# Patient Record
Sex: Female | Born: 1935 | Race: Black or African American | Hispanic: No | Marital: Single | Smoking: Never smoker
Health system: Southern US, Community
[De-identification: ages and names within clinical notes are randomized; demographics above are authoritative.]

## PROBLEM LIST (undated history)

## (undated) DIAGNOSIS — K6389 Other specified diseases of intestine: Secondary | ICD-10-CM

## (undated) DIAGNOSIS — I10 Essential (primary) hypertension: Secondary | ICD-10-CM

## (undated) DIAGNOSIS — E785 Hyperlipidemia, unspecified: Secondary | ICD-10-CM

## (undated) DIAGNOSIS — M1990 Unspecified osteoarthritis, unspecified site: Secondary | ICD-10-CM

## (undated) HISTORY — DX: Unspecified osteoarthritis, unspecified site: M19.90

## (undated) HISTORY — DX: Essential (primary) hypertension: I10

## (undated) HISTORY — PX: COLONOSCOPY: SHX174

## (undated) HISTORY — DX: Hyperlipidemia, unspecified: E78.5

## (undated) HISTORY — DX: Other specified diseases of intestine: K63.89

---

## 1996-02-18 LAB — HM MAMMOGRAPHY

## 1998-01-17 ENCOUNTER — Encounter: Payer: Self-pay | Admitting: Internal Medicine

## 1998-01-17 LAB — CONVERTED CEMR LAB

## 1998-03-17 ENCOUNTER — Encounter: Admission: RE | Admit: 1998-03-17 | Discharge: 1998-06-15 | Payer: Self-pay | Admitting: Family Medicine

## 1999-10-24 ENCOUNTER — Encounter: Payer: Self-pay | Admitting: Internal Medicine

## 1999-10-24 ENCOUNTER — Ambulatory Visit (HOSPITAL_COMMUNITY): Admission: RE | Admit: 1999-10-24 | Discharge: 1999-10-24 | Payer: Self-pay | Admitting: Internal Medicine

## 2000-03-27 ENCOUNTER — Ambulatory Visit (HOSPITAL_COMMUNITY): Admission: RE | Admit: 2000-03-27 | Discharge: 2000-03-27 | Payer: Self-pay | Admitting: Internal Medicine

## 2000-03-27 ENCOUNTER — Encounter: Payer: Self-pay | Admitting: Internal Medicine

## 2000-12-17 ENCOUNTER — Other Ambulatory Visit: Admission: RE | Admit: 2000-12-17 | Discharge: 2000-12-17 | Payer: Self-pay | Admitting: Internal Medicine

## 2005-01-12 ENCOUNTER — Ambulatory Visit: Payer: Self-pay | Admitting: Internal Medicine

## 2005-01-16 ENCOUNTER — Ambulatory Visit: Payer: Self-pay | Admitting: Internal Medicine

## 2006-08-12 ENCOUNTER — Ambulatory Visit: Payer: Self-pay | Admitting: Internal Medicine

## 2006-08-23 ENCOUNTER — Ambulatory Visit: Payer: Self-pay | Admitting: Internal Medicine

## 2006-09-16 ENCOUNTER — Ambulatory Visit: Payer: Self-pay | Admitting: Internal Medicine

## 2006-09-16 LAB — CONVERTED CEMR LAB
ALT: 15 units/L (ref 0–40)
AST: 20 units/L (ref 0–37)
Chol/HDL Ratio, serum: 3.9
Cholesterol: 155 mg/dL (ref 0–200)
HDL: 39.9 mg/dL (ref >39.0)
LDL Cholesterol: 102 mg/dL — ABNORMAL HIGH (ref 0–99)
Triglyceride fasting, serum: 65 mg/dL (ref 0–149)
VLDL: 13 mg/dL (ref 0–40)

## 2006-09-23 ENCOUNTER — Ambulatory Visit: Payer: Self-pay | Admitting: Internal Medicine

## 2006-09-24 ENCOUNTER — Ambulatory Visit: Payer: Self-pay | Admitting: Internal Medicine

## 2006-10-01 ENCOUNTER — Ambulatory Visit: Payer: Self-pay | Admitting: Internal Medicine

## 2006-10-21 ENCOUNTER — Ambulatory Visit: Payer: Self-pay | Admitting: Internal Medicine

## 2006-10-22 ENCOUNTER — Ambulatory Visit: Payer: Self-pay | Admitting: Internal Medicine

## 2006-11-01 ENCOUNTER — Ambulatory Visit: Payer: Self-pay | Admitting: Internal Medicine

## 2006-11-01 ENCOUNTER — Encounter (INDEPENDENT_AMBULATORY_CARE_PROVIDER_SITE_OTHER): Payer: Self-pay | Admitting: Specialist

## 2006-11-01 LAB — HM COLONOSCOPY

## 2006-11-19 DIAGNOSIS — K6389 Other specified diseases of intestine: Secondary | ICD-10-CM

## 2006-11-19 HISTORY — DX: Other specified diseases of intestine: K63.89

## 2006-11-19 HISTORY — PX: COLECTOMY: SHX59

## 2006-12-17 ENCOUNTER — Ambulatory Visit: Payer: Self-pay | Admitting: Internal Medicine

## 2006-12-17 LAB — CONVERTED CEMR LAB
Creatinine, Ser: 0.9 mg/dL (ref 0.4–1.2)
Eosinophils Absolute: 0.3 10*3/uL (ref 0.0–0.6)
Eosinophils Relative: 7.4 % — ABNORMAL HIGH (ref 0.0–5.0)
HCT: 37.6 % (ref 36.0–46.0)
MCV: 82.3 fL (ref 78.0–100.0)
Platelets: 336 10*3/uL (ref 150–400)
RBC: 4.57 M/uL (ref 3.87–5.11)
RDW: 11.7 % (ref 11.5–14.6)
Sed Rate: 24 mm/hr (ref 0–25)
WBC: 4.5 10*3/uL (ref 4.5–10.5)

## 2006-12-24 ENCOUNTER — Ambulatory Visit: Payer: Self-pay | Admitting: Cardiology

## 2007-01-03 ENCOUNTER — Ambulatory Visit (HOSPITAL_COMMUNITY): Admission: RE | Admit: 2007-01-03 | Discharge: 2007-01-03 | Payer: Self-pay | Admitting: Internal Medicine

## 2007-04-10 ENCOUNTER — Inpatient Hospital Stay (HOSPITAL_COMMUNITY): Admission: RE | Admit: 2007-04-10 | Discharge: 2007-04-13 | Payer: Self-pay | Admitting: General Surgery

## 2007-04-10 ENCOUNTER — Encounter (INDEPENDENT_AMBULATORY_CARE_PROVIDER_SITE_OTHER): Payer: Self-pay | Admitting: General Surgery

## 2007-05-16 ENCOUNTER — Ambulatory Visit: Payer: Self-pay | Admitting: Internal Medicine

## 2007-07-24 ENCOUNTER — Encounter: Payer: Self-pay | Admitting: Internal Medicine

## 2007-07-24 DIAGNOSIS — I10 Essential (primary) hypertension: Secondary | ICD-10-CM | POA: Insufficient documentation

## 2007-07-24 DIAGNOSIS — M810 Age-related osteoporosis without current pathological fracture: Secondary | ICD-10-CM

## 2007-07-24 DIAGNOSIS — E785 Hyperlipidemia, unspecified: Secondary | ICD-10-CM

## 2007-10-03 ENCOUNTER — Ambulatory Visit: Payer: Self-pay | Admitting: Internal Medicine

## 2008-02-04 ENCOUNTER — Ambulatory Visit: Payer: Self-pay | Admitting: Internal Medicine

## 2008-02-04 LAB — CONVERTED CEMR LAB
Basophils Relative: 1.1 % — ABNORMAL HIGH (ref 0.0–1.0)
Bilirubin, Direct: 0.2 mg/dL (ref 0.0–0.3)
CO2: 30 meq/L (ref 19–32)
Eosinophils Relative: 7 % — ABNORMAL HIGH (ref 0.0–5.0)
GFR calc Af Amer: 80 mL/min
Glucose, Bld: 93 mg/dL (ref 70–99)
HDL: 49.3 mg/dL (ref >39.0)
Hemoglobin: 12.5 g/dL (ref 12.0–15.0)
Lymphocytes Relative: 52.5 % — ABNORMAL HIGH (ref 12.0–46.0)
MCV: 83.1 fL (ref 78.0–100.0)
Monocytes Absolute: 0.6 10*3/uL (ref 0.2–0.7)
Monocytes Relative: 13.7 % — ABNORMAL HIGH (ref 3.0–11.0)
Neutro Abs: 1.1 10*3/uL — ABNORMAL LOW (ref 1.4–7.7)
Potassium: 3.6 meq/L (ref 3.5–5.1)
TSH: 0.73 microintl units/mL (ref 0.35–5.50)
Total Protein: 7.3 g/dL (ref 6.0–8.3)
VLDL: 14 mg/dL (ref 0–40)
WBC: 4.4 10*3/uL — ABNORMAL LOW (ref 4.5–10.5)

## 2008-02-11 ENCOUNTER — Ambulatory Visit: Payer: Self-pay | Admitting: Internal Medicine

## 2008-02-11 DIAGNOSIS — K6289 Other specified diseases of anus and rectum: Secondary | ICD-10-CM | POA: Insufficient documentation

## 2008-03-22 ENCOUNTER — Ambulatory Visit: Payer: Self-pay | Admitting: Internal Medicine

## 2008-03-22 ENCOUNTER — Encounter: Payer: Self-pay | Admitting: Internal Medicine

## 2008-04-04 ENCOUNTER — Encounter: Payer: Self-pay | Admitting: Internal Medicine

## 2008-08-02 ENCOUNTER — Telehealth: Payer: Self-pay | Admitting: Internal Medicine

## 2009-03-07 ENCOUNTER — Encounter: Payer: Self-pay | Admitting: Internal Medicine

## 2009-03-14 ENCOUNTER — Ambulatory Visit: Payer: Self-pay | Admitting: Internal Medicine

## 2009-03-14 LAB — CONVERTED CEMR LAB
Albumin: 3.9 g/dL (ref 3.5–5.2)
Basophils Relative: 0.1 % (ref 0.0–3.0)
Bilirubin, Direct: 0.2 mg/dL (ref 0.0–0.3)
Cholesterol: 171 mg/dL (ref 0–200)
Eosinophils Absolute: 0.4 10*3/uL (ref 0.0–0.7)
GFR calc non Af Amer: 90.91 mL/min (ref >60)
HDL: 53.5 mg/dL (ref >39.00)
Hemoglobin, Urine: NEGATIVE
MCHC: 33.3 g/dL (ref 30.0–36.0)
MCV: 83 fL (ref 78.0–100.0)
Monocytes Absolute: 0.6 10*3/uL (ref 0.1–1.0)
Neutrophils Relative %: 29.8 % — ABNORMAL LOW (ref 43.0–77.0)
Nitrite: NEGATIVE
Potassium: 4.1 meq/L (ref 3.5–5.1)
RBC: 4.86 M/uL (ref 3.87–5.11)
Sodium: 143 meq/L (ref 135–145)
Total Protein, Urine: NEGATIVE mg/dL
Total Protein: 7.7 g/dL (ref 6.0–8.3)
Urobilinogen, UA: 0.2 (ref 0.0–1.0)
VLDL: 12.2 mg/dL (ref 0.0–40.0)

## 2009-03-24 ENCOUNTER — Ambulatory Visit: Payer: Self-pay | Admitting: Internal Medicine

## 2009-03-24 DIAGNOSIS — E0590 Thyrotoxicosis, unspecified without thyrotoxic crisis or storm: Secondary | ICD-10-CM

## 2009-03-24 LAB — CONVERTED CEMR LAB: T3, Free: 3 pg/mL (ref 2.3–4.2)

## 2009-03-27 ENCOUNTER — Encounter: Payer: Self-pay | Admitting: Internal Medicine

## 2009-03-31 ENCOUNTER — Encounter: Admission: RE | Admit: 2009-03-31 | Discharge: 2009-03-31 | Payer: Self-pay | Admitting: Internal Medicine

## 2009-04-01 ENCOUNTER — Encounter: Payer: Self-pay | Admitting: Internal Medicine

## 2009-04-11 ENCOUNTER — Telehealth: Payer: Self-pay | Admitting: Internal Medicine

## 2009-10-18 ENCOUNTER — Telehealth: Payer: Self-pay | Admitting: Internal Medicine

## 2010-03-08 ENCOUNTER — Encounter: Payer: Self-pay | Admitting: Internal Medicine

## 2010-03-15 ENCOUNTER — Encounter: Payer: Self-pay | Admitting: Internal Medicine

## 2010-04-04 ENCOUNTER — Ambulatory Visit: Payer: Self-pay | Admitting: Internal Medicine

## 2010-04-04 LAB — CONVERTED CEMR LAB
AST: 19 units/L (ref 0–37)
Alkaline Phosphatase: 54 units/L (ref 39–117)
BUN: 10 mg/dL (ref 6–23)
Basophils Absolute: 0 10*3/uL (ref 0.0–0.1)
Calcium: 9.7 mg/dL (ref 8.4–10.5)
Cholesterol: 162 mg/dL (ref 0–200)
Eosinophils Absolute: 0.4 10*3/uL (ref 0.0–0.7)
GFR calc non Af Amer: 89.35 mL/min (ref >60)
Glucose, Bld: 75 mg/dL (ref 70–99)
HDL: 53.9 mg/dL (ref >39.00)
Ketones, ur: NEGATIVE mg/dL
Lymphocytes Relative: 46.2 % — ABNORMAL HIGH (ref 12.0–46.0)
Lymphs Abs: 2.1 10*3/uL (ref 0.7–4.0)
MCHC: 34 g/dL (ref 30.0–36.0)
Monocytes Relative: 12.7 % — ABNORMAL HIGH (ref 3.0–12.0)
Nitrite: NEGATIVE
Platelets: 287 10*3/uL (ref 150.0–400.0)
RDW: 12.8 % (ref 11.5–14.6)
Specific Gravity, Urine: 1.025 (ref 1.000–1.030)
TSH: 0.16 microintl units/mL — ABNORMAL LOW (ref 0.35–5.50)
Total Bilirubin: 0.8 mg/dL (ref 0.3–1.2)
Triglycerides: 44 mg/dL (ref 0.0–149.0)
Urobilinogen, UA: 0.2 (ref 0.0–1.0)
VLDL: 8.8 mg/dL (ref 0.0–40.0)
pH: 6 (ref 5.0–8.0)

## 2010-04-14 ENCOUNTER — Ambulatory Visit: Payer: Self-pay | Admitting: Internal Medicine

## 2010-12-10 ENCOUNTER — Encounter: Payer: Self-pay | Admitting: Internal Medicine

## 2010-12-19 NOTE — Assessment & Plan Note (Signed)
Summary: YEARLY F/U / medicare / #?cd   Vital Signs:  Patient profile:   75 year old female Height:      66 inches Weight:      168 pounds BMI:     27.21 O2 Sat:      99 % on Room air Temp:     97.0 degrees F oral Pulse rate:   68 / minute BP sitting:   138 / 70  (left arm) Cuff size:   large  Vitals Entered By: Bill Salinas CMA (Apr 14, 2010 11:25 AM)  O2 Flow:  Room air CC: cpx/ ab   Primary Care Provider:  Norins  CC:  cpx/ ab.  History of Present Illness: Patient presents for medical follow-up. IN the interval since her last visit she has been doing well with no new medical problems or complaint: no major illness, surgery or injury. She has seen her opthalmologist and is OK. she did have a mammogram which required additional views of the left breast due to calcifications - stable since last study with f/u recommended in 6 months.   Current Medications (verified): 1)  Simvastatin 40 Mg  Tabs (Simvastatin) .... Once Daily 2)  Diltiazem Hcl Er Beads 240 Mg  Cp24 (Diltiazem Hcl Er Beads) .... Once Daily 3)  Lisinopril-Hydrochlorothiazide 20-25 Mg  Tabs (Lisinopril-Hydrochlorothiazide) .... Once Daily 4)  Cod Liver Oil .... Once Daily 5)  Calcium + Vitamin D 600mg  .... Once Daily 6)  Vitamin C 1000mg  .... Once Daily 7)  Multivitamins   Tabs (Multiple Vitamin) .... Take 1 Tablet By Mouth Once A Day  Allergies (verified): No Known Drug Allergies  Past History:  Past Medical History: Last updated: 02/11/2008 Hyperlipidemia Hypertension Osteoporosis CECAL MASS - BENIGN '08  Past Surgical History: Last updated: 10/03/2007 Colectomy (laparoscopic-right)-benign cecal mass '08  Family History: Last updated: 10/03/2007 CAD-both parents DM-father  Social History: Last updated: 10/03/2007 Single-maiden Retired-worked for [employer] for many years.  Risk Factors: Alcohol Use: 0 (03/24/2009) Caffeine Use: 1 cup a day (03/24/2009) Exercise: yes (03/24/2009)  Risk  Factors: Smoking Status: never (03/24/2009)  Review of Systems  The patient denies anorexia, fever, weight loss, weight gain, vision loss, decreased hearing, hoarseness, chest pain, syncope, dyspnea on exertion, peripheral edema, prolonged cough, headaches, abdominal pain, severe indigestion/heartburn, incontinence, muscle weakness, difficulty walking, depression, unusual weight change, enlarged lymph nodes, and angioedema.    Physical Exam  General:  WNWD AA female looking younger than her stated age.  Head:  Normocephalic and atraumatic without obvious abnormalities. No apparent alopecia or balding. Eyes:  vision grossly intact, pupils equal, pupils round, corneas and lenses clear, and no injection.   Ears:  External ear exam shows no significant lesions or deformities.  Otoscopic examination reveals clear canals, tympanic membranes are intact bilaterally without bulging, retraction, inflammation or discharge. Hearing is grossly normal bilaterally. Nose:  no external deformity and no external erythema.   Mouth:  edentulous with dentures in place. No oral lesions noted. Throat clear Neck:  supple, full ROM, no thyromegaly, and no carotid bruits.   Chest Wall:  no deformities.   Breasts:  patient declined - after mammograms Lungs:  Normal respiratory effort, chest expands symmetrically. Lungs are clear to auscultation, no crackles or wheezes. Heart:  Normal rate and regular rhythm. S1 and S2 normal without gallop, murmur, click, rub or other extra sounds. Abdomen:  soft, non-tender, normal bowel sounds, no guarding, and no hepatomegaly.   Genitalia:  deferred Msk:  normal ROM, no joint  tenderness, no joint swelling, and no joint warmth.   Pulses:  2+ radial and DP pulses Extremities:  No clubbing, cyanosis, edema, or deformity noted with normal full range of motion of all joints.   Neurologic:  alert & oriented X3, cranial nerves II-XII intact, strength normal in all extremities, gait  normal, and DTRs symmetrical and normal.   Skin:  turgor normal, color normal, no rashes, and no ulcerations.   Cervical Nodes:  no anterior cervical adenopathy and no posterior cervical adenopathy.   Psych:  Oriented X3, memory intact for recent and remote, normally interactive, good eye contact, and not anxious appearing.     Impression & Recommendations:  Problem # 1:  HYPERTHYROIDISM (ICD-242.90) Reviewed full labs from '10 and she had normal FT4, FT3, T3U with a TSH 0.07. This year TSH is .15. NO need to repeat full thyroid panel. May have Hashimoto's thyroditis but normal thyroid levels.   Problem # 2:  OSTEOPOROSIS (ICD-733.00) Due for DXA  Problem # 3:  HYPERTENSION (ICD-401.9)  Her updated medication list for this problem includes:    Diltiazem Hcl Er Beads 240 Mg Cp24 (Diltiazem hcl er beads) ..... Once daily    Lisinopril-hydrochlorothiazide 20-25 Mg Tabs (Lisinopril-hydrochlorothiazide) ..... Once daily  BP today: 138/70 Prior BP: 138/70 (03/24/2009)  Labs Reviewed: K+: 3.9 (04/04/2010) Creat: : 0.8 (04/04/2010)      Good control on present medications  Orders: Prescription Created Electronically [phone])  Problem # 4:  HYPERLIPIDEMIA (ICD-272.4)  Her updated medication list for this problem includes:    Simvastatin 40 Mg Tabs (Simvastatin) ..... Once daily  Labs Reviewed: SGOT: 19 (04/04/2010)   SGPT: 13 (04/04/2010)   HDL:53.90 (04/04/2010), 53.50 (03/14/2009)  LDL:99 (04/04/2010), 105 (60/45/4098)  Chol:162 (04/04/2010), 171 (03/14/2009)  Trig:44.0 (04/04/2010), 61.0 (03/14/2009)  Good control and normal liver functions. Will continue present dose of simvastatin.  Orders: Prescription Created Electronically [phone])  Problem # 5:  Preventive Health Care (ICD-V70.0) Normal exam and normal labs with findings noted. She is up to date for Tetnus and pneumonia vaccine. Last Colonoscopy '07. She is emotionally stable and doing very well with no signs or symptoms  of depression.  IN summary - a very nice woman who is medically stable. All Rx renewed.  She will return as needed.   Complete Medication List: 1)  Simvastatin 40 Mg Tabs (Simvastatin) .... Once daily 2)  Diltiazem Hcl Er Beads 240 Mg Cp24 (Diltiazem hcl er beads) .... Once daily 3)  Lisinopril-hydrochlorothiazide 20-25 Mg Tabs (Lisinopril-hydrochlorothiazide) .... Once daily 4)  Cod Liver Oil  .... Once daily 5)  Calcium + Vitamin D 600mg   .... Once daily 6)  Vitamin C 1000mg   .... Once daily 7)  Multivitamins Tabs (Multiple vitamin) .... Take 1 tablet by mouth once a day  Other Orders: Subsequent annual wellness visit with prevention plan ([id number])   Patient: Deborah Myers Note: All result statuses are Final unless otherwise noted.  Tests: (1) Lipid Panel (LIPID)   Cholesterol               162 mg/dL                   6-213     ATP III Classification            Desirable:  < 200 mg/dL                    Borderline High:  200 - 239 mg/dL  High:  > = 240 mg/dL   Triglycerides             44.0 mg/dL                  3.2-440.1     Normal:  <150 mg/dL     Borderline High:  027 - 199 mg/dL   HDL                       25.36 mg/dL                 >64.40   VLDL Cholesterol          8.8 mg/dL                   3.4-74.2   LDL Cholesterol           99 mg/dL                    5-95  CHO/HDL Ratio:  CHD Risk                             3                    Men          Women     1/2 Average Risk     3.4          3.3     Average Risk          5.0          4.4     2X Average Risk          9.6          7.1     3X Average Risk          15.0          11.0                           Tests: (2) BMP (METABOL)   Sodium                    144 mEq/L                   135-145   Potassium                 3.9 mEq/L                   3.5-5.1   Chloride                  107 mEq/L                   96-112   Carbon Dioxide            29 mEq/L                    19-32   Glucose                    75 mg/dL                    63-87   BUN  10 mg/dL                    1-61   Creatinine                0.8 mg/dL                   0.9-6.0   Calcium                   9.7 mg/dL                   4.5-40.9   GFR                       89.35 mL/min                >60  Tests: (3) CBC Platelet w/Diff (CBCD)   White Cell Count          4.6 K/uL                    4.5-10.5   Red Cell Count            4.73 Mil/uL                 3.87-5.11   Hemoglobin                13.5 g/dL                   81.1-91.4   Hematocrit                39.5 %                      36.0-46.0   MCV                       83.5 fl                     78.0-100.0   MCHC                      34.0 g/dL                   78.2-95.6   RDW                       12.8 %                      11.5-14.6   Platelet Count            287.0 K/uL                  150.0-400.0   Neutrophil %         [L]  31.5 %                      43.0-77.0   Lymphocyte %         [H]  46.2 %                      12.0-46.0   Monocyte %           [H]  12.7 %                      3.0-12.0  Eosinophils%         [H]  8.5 %                       0.0-5.0   Basophils %               1.1 %                       0.0-3.0   Neutrophill Absolute      1.4 K/uL                    1.4-7.7   Lymphocyte Absolute       2.1 K/uL                    0.7-4.0   Monocyte Absolute         0.6 K/uL                    0.1-1.0  Eosinophils, Absolute                             0.4 K/uL                    0.0-0.7   Basophils Absolute        0.0 K/uL                    0.0-0.1  Tests: (4) Hepatic/Liver Function Panel (HEPATIC)   Total Bilirubin           0.8 mg/dL                   1.6-1.0   Direct Bilirubin          0.2 mg/dL                   9.6-0.4   Alkaline Phosphatase      54 U/L                      39-117   AST                       19 U/L                      0-37   ALT                       13 U/L                      0-35   Total Protein              7.2 g/dL                    5.4-0.9   Albumin                   3.8 g/dL                    8.1-1.9  Tests: (5) TSH (TSH)   FastTSH              [L]  0.16 uIU/mL                 0.35-5.50  Tests: (6) UDip w/Micro (URINE)  Color                     YELLOW       RANGE:  Yellow;Lt. Yellow   Clarity                   CLEAR                       Clear   Specific Gravity          1.025                       1.000 - 1.030   Urine Ph                  6.0                         5.0-8.0   Protein                   NEGATIVE                    Negative   Urine Glucose             NEGATIVE                    Negative   Ketones                   NEGATIVE                    Negative   Urine Bilirubin           NEGATIVE                    Negative   Blood                     TRACE-INTACT                Negative   Urobilinogen              0.2                         0.0 - 1.0   Leukocyte Esterace        SMALL                       Negative   Nitrite                   NEGATIVE                    Negative   Urine WBC                 0-2/hpf                     0-2/hpf   Urine RBC                 0-2/hpf                     0-2/hpf   Urine Mucus               Presence of  None   Urine Epith               Rare(0-4/hpf)               Rare(0-4/hpf)Prescriptions: LISINOPRIL-HYDROCHLOROTHIAZIDE 20-25 MG  TABS (LISINOPRIL-HYDROCHLOROTHIAZIDE) once daily  #30 x 12   Entered and Authorized by:   [name] MD   Signed by:   [name] MD on 04/14/2010   Method used:   Electronically to        [facility] [address] [phone]* (retail)       [address]       [place], [place]  [address]       Ph: [phone]       Fax: [phone]   RxID:   [id number] DILTIAZEM HCL ER BEADS 240 MG  CP24 (DILTIAZEM HCL ER BEADS) once daily  #30 x 12   Entered and Authorized by:   [name] MD   Signed by:   [name] MD on 04/14/2010   Method used:   Electronically to        [facility] [phone]* (retail)       [address]       [place], [place]  [address]       Ph: [phone]       Fax: [phone]   RxID:   [id number] SIMVASTATIN 40 MG  TABS (SIMVASTATIN) once daily  #30 x 12   Entered and Authorized by:   [name] MD   Signed by:   [name] MD on 04/14/2010   Method used:   Electronically to        [facility] [phone]* (retail)       [address]       [place], [place]  [address]       Ph: [phone]       Fax: [phone]   RxID:   [id number]    Immunization History:  Influenza Immunization History:    Influenza:  declined (04/14/2010)   Appended Document: YEARLY F/U / medicare / #?cd discussed ADLs - patient remains independent in all ADLs  addressed her gait issues and she has no increased risk of falls beyond that normally expected for her age and medical condition.

## 2011-03-19 ENCOUNTER — Encounter: Payer: Self-pay | Admitting: Internal Medicine

## 2011-04-03 NOTE — Op Note (Signed)
Deborah Myers, Deborah Myers               ACCOUNT NO.:  [id number]   MEDICAL RECORD NO.:  [id number]          PATIENT TYPE:  INP   LOCATION:  0001                         FACILITY:  [facility]   PHYSICIAN:  Sharlet Salina T. Hoxworth, M.D.DATE OF BIRTH:  [birth year]   DATE OF PROCEDURE:  04/10/2007  DATE OF DISCHARGE:                               OPERATIVE REPORT   PREOPERATIVE DIAGNOSIS:  Cecal mass.   POSTOPERATIVE DIAGNOSIS:  Cecal mass.   SURGICAL PROCEDURES:  Laparoscopic right hemicolectomy.   SURGEON:  [name]. Hoxworth, M.D.   ASSISTANT:  Karie Soda, MD   ANESTHESIA:  General.   BRIEF HISTORY:  Deborah Myers is a 75 year old female who recently  presented to Dr. Marina Goodell for screening colonoscopy.  This revealed a  possible submucosal mass in the cecum near the base of the appendix,  somewhat difficult to visualize and characterize.  Biopsies revealed  only eroded surface mucosa and inflammation and reactive lymphoid  tissue.  Subsequent CT scan of the abdomen showed some thickening of the  wall of the cecum and the base of the appendix appearing distinctly  abnormal.  There were a few prominent lymph nodes adjacent.  With this  constellation of findings, we have recommended proceeding with right  hemicolectomy for possible neoplasm at the base the appendix.  The  nature of the procedure, indications, possible diagnoses, risks of  bleeding, infection and anastomotic leak, cardiorespiratory  complications and possible need for open procedure were discussed and  understood preoperatively.  Following a mechanical antibiotic bowel prep  at home, the patient was brought to the operating room for this  procedure.   DESCRIPTION OF OPERATION:  The patient was brought to the operating room  and was placed in the supine position on the operating table.  General  endotracheal anesthesia was induced.  She received preoperative IV  antibiotics.  She was carefully positioned in the  semi-lithotomy  position and the abdomen widely sterilely prepped and draped.  Correct  patient and procedure were verified.  Access was obtained with a 1-cm  incision just above the umbilicus using the [facility] trocar and  pneumoperitoneum was established.  Under direct vision, a 5-mm trocar  was placed suprapubically in the left and right lower abdomen.  There  did appear to be some thickening or mass at the base of the appendix but  no evidence of erosion through the bowel wall.  There were a few  inflammatory attachments of the sigmoid colon up around the appendix  which were taken down with the LigaSure device.  No other abnormalities  were seen laparoscopically.  The patient was placed in steep  Trendelenburg and the mesentery beneath the cecum and terminal ileum  exposed.  This was incised.  Using careful blunt dissection, the  retroperitoneal space behind the mesentery of the cecum, right colon and  terminal ileum was entered.  The avascular plane was identified and  blunt dissection was used to elevate the mesentery of the right colon up  out of the retroperitoneum.  The ureter was identified clearly along its  length and was carefully protected.  The duodenum was identified and was  swept inferiorly.  This dissection continued laterally and superiorly up  to the hepatic flexure out to the line of Toldt and over medially until  the duodenum was well exposed.  After full retroperitoneal dissection,  the ileocolic pedicle could be clearly identified and from a medial  approach was isolated and then divided near its origin using the  LigaSure device.  Some further mesentery up toward the terminal ileum  and transverse colon was further divided with the LigaSure.  Following  this, lateral attachments beginning at the cecum were divided using  scissor and cautery dissection.  The ureter again had been clearly  dissected away from the mesentery and was visualized.  Dissection was   carried up around the hepatic flexure using the LigaSure device.  The  lesser omentum was opened in an avascular area above the proximal  transverse colon and this dissection was carried back around the hepatic  flexure and the mesentery was further elevated up completely off the  duodenum carefully protecting it.  Following this full mobilization, the  [facility] trocar was removed and the incision was extended a couple of  centimeters inferiorly around the umbilicus and then the specimen  withdrawn through the incision with excellent mobilization bringing the  terminal ileum, cecum, appendix, entire right colon and proximal  transverse colon out without difficulty.  Areas for anastomosis were  chosen at the terminal ileum and transverse colon and were cleaned of  mesentery.  A functional end-to-end anastomosis was then created with a  single firing of the GIA-75 mm stapler and the common enterotomy was  closed and the specimen removed with a single firing of the  TA-60  stapler.  The crotch of the anastomosis was reinforced with 2-0 silk,  and the mesentery was closed with interrupted 2-0 silk.  A wound  protector had been used.  The bowel was then returned to the abdominal  cavity.  The wound protector was removed.  Gross inspection of the bowel  did reveal an apparent mucosal tumor of 3 cm right at the base of the  appendix.  The fascia at the extraction site was closed with running #1  PDS begun at either end and tied centrally.  Wound was infiltrated with  Marcaine.  Laparoscopy through one of the 5-mm sites was then performed.  Hemostasis was assured.  The bowel was lying naturally and not twisted.  The anastomosis was under no tension.  All CO2 was evacuated.  Trocars  were removed.  Skin incisions were closed with interrupted Monocryl and  Dermabond.  Sponge and needle counts were correct.  The patient was  taken to recovery in good condition.      [name]. Hoxworth,  M.D. Electronically Signed     BTH/MEDQ  D:  04/10/2007  T:  04/10/2007  Job:  [id number]   cc:   Wilhemina Bonito. Marina Goodell, MD  [address]  [place]  [place] [address]   Rosalyn Gess. Norins, MD  [address]  [place]  [place] [address]

## 2011-04-03 NOTE — Discharge Summary (Signed)
Deborah Myers, [name]               ACCOUNT NO.:  [id number]   MEDICAL RECORD NO.:  [id number]          PATIENT TYPE:  INP   LOCATION:  1324                         [facility]AC[facility]   PHYSICIAN:  Sharlet Salina T. Hoxworth, M.D.DATE OF BIRTH:  1936/04/26   DATE OF ADMISSION:  04/10/2007  DATE OF DISCHARGE:  04/13/2007                               DISCHARGE SUMMARY   DISCHARGE DIAGNOSIS:  Cecal mass, pathologic diagnosis florid lymphoid  hyperplasia.   OPERATIONS AND PROCEDURES:  Laparoscopic right colectomy, Dr. Johna Sheriff -  Apr 10, 2007.   HISTORY OF PRESENT ILLNESS:  Deborah Myers is a 75 year old female  followed by Dr. Debby Bud and Dr. Marina Goodell.  At recent screening colonoscopy  there was noted to be a submucous mass in the cecum near the base of the  appendix.  Biopsies revealed eroded surface epithelium only, but the  mass appeared to be submucosal.  CT scan of pelvis was subsequently  obtained showing some thickening in the wall of the cecum at the base of  the appendix.  Some adjacent prominent lymph nodes were also noted.  Colonoscopy and CT were felt to be distinctly abnormal, consistent with  possible neoplasm or inflammatory change.  With these findings after  consultation in the office, we have elected to proceed with elective  right hemicolectomy.  Following mechanical antibiotic bowel prep at  home, she is admitted for this procedure.   PAST MEDICAL HISTORY:  She is treated for hypertension and elevated  cholesterol.  No previous surgery.   MEDICATIONS ON ADMISSION:  1. Lisinopril 20/25 daily.  2. Simvastatin 40 daily.  3. Diltiazem 240 daily.  4. Multivitamins.   ALLERGIES:  None.   SOCIAL HISTORY, FAMILY HISTORY, REVIEW OF SYSTEMS:  See detailed H&P.   PERTINENT PHYSICAL EXAMINATION:  Well-developed black female, no acute  distress.  Abdomen was negative for tenderness or mass.   HOSPITAL COURSE:  On the morning of her admission the patient underwent  an uneventful  laparoscopic right colectomy.  The only gross operative  findings were some thickening of the cecal wall around the base of the  appendix.  She tolerated the procedure very well.  Final pathology  revealed florid lymphoid hyperplasia involving the cecum over about a  3.5-cm area.  This was widely resected.  Seventeen lymph nodes were also  examined, showing florid lymphoid hyperplasia.  Immunohistochemical  staining and other studies were negative for lymphoma.  The patient's  postoperative course was smooth.  She was begun on clear liquids on the  first postoperative day and was able to be advanced quickly to a regular  diet, which she tolerated well with positive  bowel movement on the third postoperative day.  She was discharged home  on the third postoperative day.  Tolerating regular diet.  Abdomen was  soft and nontender.  Incisions healing well.  Followup will be in my  office in 1 week.      [name]. Hoxworth, M.D.  Electronically Signed     BTH/MEDQ  D:  05/12/2007  T:  05/12/2007  Job:  [id number]   cc:  Wilhemina Bonito. Marina Goodell, MD  [address]  [place]  [place] [address]   Rosalyn Gess. Norins, MD  [address]  [place]  [place] [address]

## 2011-04-06 NOTE — Assessment & Plan Note (Signed)
[facility]                         GASTROENTEROLOGY OFFICE NOTE   NAME:Deborah Myers, Deborah Myers                   MRN:          [id number]  DATE:12/24/2006                            DOB:          [birth year]    [place] was seen recently after undergoing routine screening  colonoscopy.  She was noted to have abnormal appearing submucosal lesion  at the entrance to the appendix.  See my last office dictation for  details.  I scheduled her for a CT scan of the abdomen and pelvis, which  was performed this morning.  The cecum at the base of the appendix was  indeed noted to be thick.  In addition, there were a few mildly  prominent mesenteric lymph nodes and minimal pericecal or  periappendiceal fat stranding.  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  This was said  to be atypical for adenoma.  An MRI was recommended.  I discussed these  findings in detail with the patient.  We will set her up for an MRI of  the adrenal.  In addition, arrange surgical consultation regarding not  only the adrenal abnormality, but also the appendiceal abnormality. She  may require surgery for tissue diagnosis of one or both abnormalities.     Wilhemina Bonito. Marina Goodell, MD  Electronically Signed    JNP/MedQ  DD: 12/24/2006  DT: 12/24/2006  Job #: [id number]   cc:   Rosalyn Gess. Norins, MD

## 2011-04-06 NOTE — Assessment & Plan Note (Signed)
[facility]                             PRIMARY CARE OFFICE NOTE   KENYAH, LUBA                   MRN:          [id number]  DATE:08/23/2006                            DOB:          [birth year]    Deborah Myers is a 75 year old African-American woman with multiple medical  problems who returns for followup with evaluation and exam.  She was last  seen in the office May 08, 2005 for a general examination.  At that time  her blood pressure was suboptimally controlled.  The patient had had her  lipids checked and they were markedly elevated.  She was restarted at that  time on Lovastatin 40 mg daily.  She reports communication that the patient  completed her medications and then discontinued and never returned for  followup study.  She has been off medication since that time.   The patient voices no complaints or problems at this time.   PAST MEDICAL HISTORY:  Surgery:  1. Tonsillectomy remote.  2. Bartholin cyst excision by Dr. Bruna Potter, remotely.  Medical:  1. Usual childhood disease.  2. Hypertension.  3. Hyperlipidemia.  4. Osteoporosis.   CURRENT MEDICATIONS:  1. Triamterine/ hydrochlorothiazide 37.5/25.  2. Cod liver oil, calcium with D.   FAMILY HISTORY:  Noncontributory.   SOCIAL HISTORY:  The patient is retired.  She lives alone.  She is active  and busy.  She regularly exercises.   REVIEW OF SYSTEMS:  Negative for constitutional, cardiovascular,  respirations, GI or GU problems.   CHART REVIEW:  The patient's last ________ DEXA scan December 27, 2003 was  osteopenia with a negative P-score of -1.2, PA spine, 0.173 of the left hip  total, -0.7 at the left femoral neck.  Last colorectal cancer screen was flex sig in 2002 that was normal.  No mammogram reports on the chart.   PHYSICAL EXAMINATION:  Temperature was 98.1, blood pressure 154/88, pulse  83, weight 175.  GENERAL APPEARANCE:  A well-nourished, well-groomed  woman looking younger  than her stated chronologic age.  HEENT:  Normocephalic and atraumatic.  The patient has a full upper denture.  She is missing molars bilaterally on the mandible.  No buccal lesions were  noted.  Posterior pharynx was clear.  Conjunctiva and sclerae were clear.  PERRLA. EOMi.  Funduscopic exam was unremarkable.  NECK:  Supple without thyromegaly, no adenopathy was noted in the cervical  or supraclavicular regions.  CHEST:  With CVA tenderness.  LUNGS:  Clear to auscultation and percussion.  CARDIOVASCULAR:  2+ radial pulse, no JVD, no carotid bruits.  She had a  quiet precordium with regular rate and rhythm without murmurs, rubs or  gallops.  BREAST EXAM:  Skin was normal.  Nipples without discharge.  No fixed mass,  lesion or abnormalities were noted.  ABDOMEN:  Soft, no guarding or rebound, no hepatosplenomegaly was noted.  Pelvic and rectal exams were deferred.  EXTREMITIES:  Without clubbing, cyanosis, edema, no deformities were noted.  NEUROLOGIC:  Nonfocal.   DATABASE:  Hemoglobin 13.4, white count was 4,800 with a normal  differential.  Chemistries were normal,  with a blood sugar of 99.  Kidney  function normal with a creatinine of 0.9 and a GFR of 80 ml/minute.  Liver  functions were normal.  Cholesterol was 244, triglycerides  73, HDL was  43.9, LDL was 185, thyroid function normal with a TSH of 0.61.  Urinalysis  with 1+ bacteria, 1-5 epithelial cells, 3-5 WBCs.   EKG:  The patient had a normal sinus rhythm with a first degree AV block  that was otherwise unremarkable.   ASSESSMENT AND PLAN:  1. Hypertension.  The patient's blood pressure is suboptimally controlled      at 154/88.  Her past record was reviewed in detail and she consistently      has been running systolics in the 150 range.  Plan:  The patient's      medication is changed to lisinopril with hydrochlorothiazide 10/12.5      mg.  Prescriptions transmitted.  She is to return in 3-4  weeks for      followup and we will also need a followup BUN and creatinine.  2. Lipids.  The patient with marked hyperlipidemia.  At this time we will      restart her on Statin therapy using Simvastatin 40 mg daily.      Prescription is transmitted.  The patient is to return in 3-4 weeks for      followup lipid panel.  I made it clear to the patient that we intended      to continue her medications but we need to assure she is on the      appropriate and correct dose.  3. Health maintenance.  The patient is due for colorectal cancer screening      and will be referred to gastroenterology for full colonoscopy.  We will      check with the patient with regards to mammography given there is no      report on the chart.  If not done in the last year, she would need to      have a mammogram for routine health maintenance.   Deborah Myers is a very pleasant woman looking younger than her stated age.  She is seen as medically stable at that time.  She will return for followup  on her blood pressure and lipid panel as noted.            ______________________________  Rosalyn Gess Norins, MD      MEN/MedQ  DD:  08/24/2006  DT:  08/26/2006  Job #:  [id number]   cc:   [place], [place] [address] Ms. [place], [address]

## 2011-04-06 NOTE — Assessment & Plan Note (Signed)
[facility]                         GASTROENTEROLOGY OFFICE NOTE   Estie, Sproule VANASSA PENNIMAN                   MRN:          [id number]  DATE:12/17/2006                            DOB:          June 03, 1936    HISTORY:  Ms. Maxson presents today to the office as a new GI patient.  This after undergoing screening colonoscopy on November 01, 2006.  Screening colonoscopy revealed pan diverticulosis and multiple  diminutive colon polyps which were removed and proved to be adenomatous.  The most interesting finding, however, was that of a firm submucosal  mass in the region of the appendiceal orifice.  Multiple biopsies were  taken and revealed underlying mucosal inflammation with increased  eosinophils and prominent reactive appearing submucosal lymphoid tissue.  The patient was asked to return regarding these findings.  She has no GI  complaints.  In particular, no abdominal pain, change in bowel habits,  bleeding, change in appetite or weight loss.  There is no family history  of lymphoma.  Of interest, her blood work from September of 2007  revealed the patient to have a normal hemoglobin.  Also normal white  blood cell count.  However, increased eosinophil count at 7.9%.   PAST MEDICAL HISTORY:  1. Hypertension.  2. Hyperlipidemia.  3. Osteoporosis.   PAST SURGICAL HISTORY:  None.   ALLERGIES:  NO KNOWN DRUG ALLERGIES.   CURRENT MEDICATIONS:  1. Simvastatin 40 mg daily.  2. Diltiazem ER 180 mg daily.  3. Lisinopril/hydrochlorothiazide 20/25 mg daily.  4. Cod liver oil.  5. Calcium with D.  6. Vitamin C.   FAMILY HISTORY:  1. Both parents with heart disease.  2. Father with diabetes.  3. No history of gastrointestinal malignancy.   SOCIAL HISTORY:  The patient is single, without children.  She lives  alone.  She is retired from [employer].  She does not smoke or use  alcohol.   REVIEW OF SYSTEMS:  Per Diagnostic Evaluation  Form.   PHYSICAL EXAM:  A well-appearing  female in no acute distress.  Blood  pressure is 160/80, heart rate is 80, weight is 173.4 pounds.  She is 5  feet 6 inches in height.  HEENT:  Sclera are anicteric, conjunctiva are pink, oral mucosa intact.  There is no adenopathy.  LUNGS:  Clear.  HEART:  Regular.  ABDOMEN:  Soft without tenderness, mass or hernia.  EXTREMITIES:  Without edema.   IMPRESSION:  This is a 75 year old female who recently underwent routine  screening colonoscopy and was found to have multiple adenomatous colon  polyps as well as diverticulosis.  Most interestingly, however, was the  presence of firm submucosal lesion in the region of the appendiceal  orifice.  Biopsy showing increased lymphoid and eosinophils.  Prior CBC  showing increased eosinophils.  Obviously, occult neoplasm should be  excluded.  Possibilities include lymphoma or carcinoid tumor.   RECOMMENDATIONS:  1. Obtain a contrast-enhanced CT scan of the abdomen and pelvis to      further evaluate the appendiceal region.  2. Repeat CBC with differential, erythrocyte sedimentation rate, in  addition to basic chemistries pre-CT.  Patient may need surgery if      the CT is abnormal.  I have discussed this with her in detail.  She      understands.     Wilhemina Bonito. Marina Goodell, MD  Electronically Signed    JNP/MedQ  DD: 12/17/2006  DT: 12/17/2006  Job #: [id number]   cc:   Rosalyn Gess. Norins, MD

## 2011-04-09 ENCOUNTER — Other Ambulatory Visit: Payer: Self-pay

## 2011-04-17 ENCOUNTER — Ambulatory Visit (INDEPENDENT_AMBULATORY_CARE_PROVIDER_SITE_OTHER): Payer: Medicare Other | Admitting: Internal Medicine

## 2011-04-17 ENCOUNTER — Encounter: Payer: Self-pay | Admitting: Internal Medicine

## 2011-04-17 ENCOUNTER — Other Ambulatory Visit (INDEPENDENT_AMBULATORY_CARE_PROVIDER_SITE_OTHER): Payer: Medicare Other

## 2011-04-17 VITALS — BP 168/82 | HR 64 | Temp 98.1°F | Wt 166.0 lb

## 2011-04-17 DIAGNOSIS — Z8371 Family history of colonic polyps: Secondary | ICD-10-CM

## 2011-04-17 DIAGNOSIS — Z136 Encounter for screening for cardiovascular disorders: Secondary | ICD-10-CM

## 2011-04-17 DIAGNOSIS — Z86010 Personal history of colonic polyps: Secondary | ICD-10-CM

## 2011-04-17 DIAGNOSIS — I10 Essential (primary) hypertension: Secondary | ICD-10-CM

## 2011-04-17 DIAGNOSIS — E0590 Thyrotoxicosis, unspecified without thyrotoxic crisis or storm: Secondary | ICD-10-CM

## 2011-04-17 DIAGNOSIS — Z23 Encounter for immunization: Secondary | ICD-10-CM

## 2011-04-17 DIAGNOSIS — E785 Hyperlipidemia, unspecified: Secondary | ICD-10-CM

## 2011-04-17 LAB — CBC WITH DIFFERENTIAL/PLATELET
Basophils Absolute: 0 10*3/uL (ref 0.0–0.1)
Basophils Relative: 0.9 % (ref 0.0–3.0)
Eosinophils Relative: 7.6 % — ABNORMAL HIGH (ref 0.0–5.0)
HCT: 40.1 % (ref 36.0–46.0)
Hemoglobin: 13.4 g/dL (ref 12.0–15.0)
Lymphs Abs: 2.1 10*3/uL (ref 0.7–4.0)
Monocytes Relative: 14 % — ABNORMAL HIGH (ref 3.0–12.0)
Neutro Abs: 1.3 10*3/uL — ABNORMAL LOW (ref 1.4–7.7)
RDW: 12.6 % (ref 11.5–14.6)

## 2011-04-17 LAB — COMPREHENSIVE METABOLIC PANEL
AST: 20 U/L (ref 0–37)
Albumin: 4 g/dL (ref 3.5–5.2)
Alkaline Phosphatase: 64 U/L (ref 39–117)
Potassium: 4 mEq/L (ref 3.5–5.1)
Sodium: 143 mEq/L (ref 135–145)
Total Protein: 7.8 g/dL (ref 6.0–8.3)

## 2011-04-17 LAB — HEPATIC FUNCTION PANEL
ALT: 13 U/L (ref 0–35)
AST: 20 U/L (ref 0–37)
Albumin: 4 g/dL (ref 3.5–5.2)
Alkaline Phosphatase: 64 U/L (ref 39–117)

## 2011-04-17 LAB — LIPID PANEL
LDL Cholesterol: 107 mg/dL — ABNORMAL HIGH (ref 0–99)
Total CHOL/HDL Ratio: 3
VLDL: 9.2 mg/dL (ref 0.0–40.0)

## 2011-04-17 MED ORDER — DILTIAZEM HCL ER BEADS 240 MG PO CP24: 240.0000 mg | ORAL_CAPSULE | Freq: Every day | ORAL | Status: DC

## 2011-04-17 MED ORDER — SIMVASTATIN 40 MG PO TABS: 40.0000 mg | ORAL_TABLET | Freq: Every day | ORAL | Status: DC

## 2011-04-17 MED ORDER — LISINOPRIL-HYDROCHLOROTHIAZIDE 20-25 MG PO TABS: 1.0000 | ORAL_TABLET | Freq: Every day | ORAL | Status: DC

## 2011-04-17 MED ORDER — PNEUMOCOCCAL VAC POLYVALENT 25 MCG/0.5ML IJ INJ: 0.5000 mL | INJECTION | Freq: Once | INTRAMUSCULAR | Status: DC

## 2011-04-17 NOTE — Patient Instructions (Signed)
Good exam physically. We will let you know about the lab results.  Memory - you did OK on the MMSE questions. Recommend B vitamins, gingko biloba - an herbal, and use your mind. Come back in 6 months for repeat exam.  Thanks for coming in.

## 2011-04-17 NOTE — Progress Notes (Signed)
Subjective:    Patient ID: Deborah Myers, female    DOB: 07-Oct-1936, 75 y.o.   MRN: [id number]  HPI The patient is here for annual Medicare wellness examination and management of other chronic and acute problems. She is feeling well without any major illness, surgery or injury. She does have pain in the posterior left neck. She has stiffness and pain with movement worse in the AM and in cold weather.    The risk factors are reflected in the social history.  The roster of all physicians providing medical care to patient - is listed in the Snapshot section of the chart.  Activities of daily living:  The patient is 100% inedpendent in all ADLs: dressing, toileting, feeding as well as independent mobility  Home safety : The patient does not have smoke detectors in the home. They wear seatbelts. No firearms at home. There is no violence in the home. No falls and no increased fall risk.   There is no risks for hepatitis, STDs or HIV. There is no   history of blood transfusion. They have no travel history to infectious disease endemic areas of the world.  The patient has seen their dentist in the last six month. They have seen their eye doctor in the last year. They deny any hearing difficulty and have not had audiologic testing in the last year.  They do not  have excessive sun exposure. Discussed the need for sun protection: hats, long sleeves and use of sunscreen if there is significant sun exposure.   Diet: the importance of a healthy diet is discussed. They do have a healthy (unhealthy-high fat/fast food) diet.  Exercise - 3 times a week she goes to water exercise class. She remains active.  Depression - no anhedonia, change in ap[peitie or other vegetive signs of depression.   Cognitive - she wants to prevent memory loss but isn't really having trouble at this time.   Past Medical History  Diagnosis Date  . Hyperlipidemia   . HTN (hypertension)   . Osteoporosis   . Cecum mass 2008     Benign    Past Surgical History  Procedure Date  . Colectomy 2008    Benign mass - laparoscopic right   Family History  Problem Relation Age of Onset  . Coronary artery disease Mother   . Diabetes Mother   . Heart disease Mother     CHF  . Coronary artery disease Father   . Diabetes Father   . Heart disease Father   . Diabetes Sister   . Diabetes Brother   . Cancer Neg Hx   . COPD Neg Hx    History   Social History  . Marital Status: Single    Spouse Name: N/A    Number of Children: N/A  . Years of Education: N/A   Occupational History  . RETIRED [name]   Social History Main Topics  . Smoking status: Never Smoker   . Smokeless tobacco: Never Used  . Alcohol Use: 2.0 oz/week    4 drink(s) per week  . Drug Use: No  . Sexually Active: Not Currently   Other Topics Concern  . Not on file   Social History Narrative   HS[employer], [employer]. Never married. No children. Work - retired from [employer] after 34 years. Lives alone.End of Life Care - interested in this. Provided a packet -May 29th, 2012.         Review of Systems     Objective:  Physical Exam Physical Exam  Constitutional: She is oriented to person, place, and time. Vital signs are normal. She appears well-developed and well-nourished.    Pleasant AA  woman in no distress  HENT:  Head: Normocephalic and atraumatic.  Right Ear: External ear normal.  Left Ear: External ear normal.       EACs and TMs normal  Eyes: Conjunctivae and EOM are normal. Pupils are equal, round, and reactive to light. No scleral icterus.  Neck: Normal range of motion. Neck supple. No JVD present. No thyromegaly present.  Cardiovascular: Normal rate, regular rhythm and normal heart sounds.  Exam reveals no friction rub.   No murmur heard.      Radial and Dorsalis Pedis pulses normal  Pulmonary/Chest: Effort normal and breath sounds normal. No respiratory distress. She has no wheezes. She has no rales.       No chest wall deformity  with normal A-P diameter. Breast exam: skin normal, no nipple discharge, no fixed masses or abnormalities, no axillary nodes or masses  Abdominal: Soft. Bowel sounds are normal. She exhibits no distension and no mass. There is no tenderness. There is no guarding.       No hepatosplenomegaly  Genitourinary:       Exam deferred to gyn  Musculoskeletal: Normal range of motion. She exhibits no edema and no tenderness.       No joint swelling, no synovial thickening, no deformity of the small, medium or large joints.  Lymphadenopathy:    She has no cervical adenopathy.  Neurological: She is alert and oriented to person, place, and time. She has normal reflexes. No cranial nerve deficit. Coordination normal.       Normal facial symmetry, normal gross motor strength throughout, no tremor or cogwheeling, normal rapid finger movement.       MMSE: 1. Day,date,year - OK 2. Content: president- OK  Gov. -   Current events - OK 3. Number repitition: 5 fwd -ok    4 rev - 1 error        World reversed -ok 4. 3 word recall - o/3 5. Serial 7's - poor performance, nickles in $1.25- ok    Change making - ok 6. Naming objects - OK       4 legged creatures- ok, with hesitancy 7. Parables:  Gl[place] - poor      Rolling stone - concrete thinking ( gave up on effort ) 8. Judgement:  Letter  ok        Fire ok 9. Clock face exercise Skin: Skin is warm and dry. No rash noted. No erythema.       No suspicious lesions. Normal turgor. Nails are normal.  Psychiatric: She has a normal mood and affect. Her behavior is normal. Thought content normal.       Normal recall     Lab Results  Component Value Date   WBC 4.4* 04/17/2011   HGB 13.4 04/17/2011   HCT 40.1 04/17/2011   PLT 310.0 04/17/2011   CHOL 177 04/17/2011   TRIG 46.0 04/17/2011   HDL 61.10 04/17/2011   ALT 13 04/17/2011   ALT 13 04/17/2011   AST 20 04/17/2011   AST 20 04/17/2011   NA 143 04/17/2011   K 4.0 04/17/2011   CL 105 04/17/2011   CREATININE 0.8  04/17/2011   BUN 12 04/17/2011   CO2 29 04/17/2011   TSH 0.01 Repeated and verified X2.* 04/17/2011   Lab Results  Component Value Date  LDLCALC 107* 04/17/2011      Assessment & Plan:  1. Hyperthyroidism - patient with a low TSH. She is asymptomatic  Plan - add FT4 to blood in lab. If elevated will need to consider Iodine ablation vs medical therapy  2. Hyperlipidemia - good control on present regimen. No change at this time. Note, she has done well without side effects on Simvastatin 40 mg daily along with CCB - diltiazem for greater than 1 year.  3. Hypertension - sub-optimal control today. By report her BP is usually better.   Plan - continue present medications           Home monitoring of BP with report back: if continue systolic elevation will modify medications.  4. Memory - she did very well on MMSE with no indication of cognitive impairment that would require medical therapy. The results of the MMSE over-estimate difficulties due to poor effort especially on arithmetic function and abstract thinking.   Plan - repeat MMSE at next office visit.  5. Health maintenance - benign interval history. Physical exam is normal. Lab results are in normal limits except for TSH. She is current with mammography. She is s/p hysterectomy. She is due this year for follow-up colonoscopy - will refer for follow-up. Immunizations - current for tetanus and pneumonia. She has had shingles. 12 lead EKG without evidence of ischemia or injury.  In summary - a very nice woman who appears medically stable except for her hyperthyroidism. She is counseled to develop a flex-stretch exercise program, to monitor her blood pressure, to keep mentally active. She will return as needed or in one year.

## 2011-10-27 ENCOUNTER — Encounter: Payer: Self-pay | Admitting: Internal Medicine

## 2011-11-08 ENCOUNTER — Ambulatory Visit (AMBULATORY_SURGERY_CENTER): Payer: Medicare Other | Admitting: *Deleted

## 2011-11-08 DIAGNOSIS — Z860100 Personal history of colon polyps, unspecified: Secondary | ICD-10-CM

## 2011-11-08 DIAGNOSIS — Z1211 Encounter for screening for malignant neoplasm of colon: Secondary | ICD-10-CM

## 2011-11-08 MED ORDER — PEG-KCL-NACL-NASULF-NA ASC-C 100 G PO SOLR: 1.0000 | Freq: Once | ORAL | Status: DC

## 2011-11-22 ENCOUNTER — Ambulatory Visit (AMBULATORY_SURGERY_CENTER): Payer: Medicare Other | Admitting: Internal Medicine

## 2011-11-22 ENCOUNTER — Encounter: Payer: Self-pay | Admitting: Internal Medicine

## 2011-11-22 VITALS — BP 190/92 | HR 97 | Temp 98.6°F | Resp 16 | Ht 66.5 in | Wt 170.0 lb

## 2011-11-22 DIAGNOSIS — Z860100 Personal history of colon polyps, unspecified: Secondary | ICD-10-CM

## 2011-11-22 DIAGNOSIS — E039 Hypothyroidism, unspecified: Secondary | ICD-10-CM | POA: Diagnosis not present

## 2011-11-22 DIAGNOSIS — I10 Essential (primary) hypertension: Secondary | ICD-10-CM | POA: Diagnosis not present

## 2011-11-22 DIAGNOSIS — Z1211 Encounter for screening for malignant neoplasm of colon: Secondary | ICD-10-CM

## 2011-11-22 DIAGNOSIS — D126 Benign neoplasm of colon, unspecified: Secondary | ICD-10-CM | POA: Diagnosis not present

## 2011-11-22 DIAGNOSIS — M810 Age-related osteoporosis without current pathological fracture: Secondary | ICD-10-CM | POA: Diagnosis not present

## 2011-11-22 DIAGNOSIS — K5730 Diverticulosis of large intestine without perforation or abscess without bleeding: Secondary | ICD-10-CM

## 2011-11-22 MED ORDER — SODIUM CHLORIDE 0.9 % IV SOLN: 500.0000 mL | INTRAVENOUS | Status: DC

## 2011-11-22 NOTE — Progress Notes (Signed)
Patient did not experience any of the following events: a burn prior to discharge; a fall within the facility; wrong site/side/patient/procedure/implant event; or a hospital transfer or hospital admission upon discharge from the facility. (G8907) Patient did not have preoperative order for IV antibiotic SSI prophylaxis. (G8918)  

## 2011-11-22 NOTE — Op Note (Signed)
[facility] [address] [place], [place]  [address]  COLONOSCOPY PROCEDURE REPORT  PATIENT:  Deborah Myers, Deborah Myers  MR#:  [id number] BIRTHDATE:  1936/10/22, 75 yrs. old  GENDER:  female ENDOSCOPIST:  Wilhemina Bonito. Eda Keys, MD REF. BY:  Surveillance Program Recall, PROCEDURE DATE:  11/22/2011 PROCEDURE:  Colonoscopy with snare polypectomy x 1 ASA CLASS:  Class II INDICATIONS:  history of pre-cancerous (adenomatous) colon polyps, surveillance and high-risk screening ; index exam 10-2006 w/ TA. ALSO, florid lymphoid hyperplasia (mass) in cecum s/p right colectomy 03-2007 MEDICATIONS:   MAC sedation, administered by CRNA, propofol (Diprivan) 240 mg IV  DESCRIPTION OF PROCEDURE:   After the risks benefits and alternatives of the procedure were thoroughly explained, informed consent was obtained.  Digital rectal exam was performed and revealed no abnormalities.   The LB CF-[id number] endoscope was introduced through the anus and advanced to the anastomosis, without limitations.  The quality of the prep was excellent, using MoviPrep.  The instrument was then slowly withdrawn as the colon was fully examined. <<PROCEDUREIMAGES>>  FINDINGS:  There was a normal ileo-colonic anastomosis.  A diminutive polyp was found in the ascending colon and snared without cautery. Retrieval was successful. Moderate diverticulosis was found found scattered throught the colon. An indidental submucosal mobile polypoid lesion measuring1.5cm in the rectosigmoid region (16cm), likely lipoma.   Retroflexed views in the rectum revealed no abnormalities.    The time to anastomosis 2:05  minutes. The scope was then withdrawn in  6:24  minutes from the cecum and the procedure completed.  COMPLICATIONS:  None  ENDOSCOPIC IMPRESSION: 1) Normal anastomosis, ileo-colon 2) Diminutive polyp in the ascending colon - removed 3) Moderate diverticulosis found scattered throught the colon 4) incidental submucosal  polyp rectosigmoid region  RECOMMENDATIONS: 1) Follow up colonoscopy in 5 years  ______________________________ Wilhemina Bonito. Eda Keys, MD  CC:  [name], MD; Glenna Fellows, MD; The Patient  n. eSIGNED:   Wilhemina Bonito. Eda Keys at 11/22/2011 02:48 PM  [place], [phone]

## 2011-11-23 ENCOUNTER — Telehealth: Payer: Self-pay | Admitting: *Deleted

## 2011-11-23 NOTE — Telephone Encounter (Signed)

## 2011-11-28 ENCOUNTER — Encounter: Payer: Self-pay | Admitting: Internal Medicine

## 2011-12-04 ENCOUNTER — Telehealth: Payer: Self-pay

## 2011-12-04 NOTE — Telephone Encounter (Signed)
Pt called c/o runny nose, hoarseness and sneezing. Pt says that she does not believe it is allergies, she thinks it is a cold. Pt is requesting MD advisement on OTC medications, please advise.

## 2011-12-04 NOTE — Telephone Encounter (Signed)
Called pt- advised robitussin DM and claritin

## 2012-03-13 DIAGNOSIS — Z1231 Encounter for screening mammogram for malignant neoplasm of breast: Secondary | ICD-10-CM | POA: Diagnosis not present

## 2012-03-19 ENCOUNTER — Encounter: Payer: Self-pay | Admitting: Internal Medicine

## 2012-03-19 ENCOUNTER — Other Ambulatory Visit (INDEPENDENT_AMBULATORY_CARE_PROVIDER_SITE_OTHER): Payer: Medicare Other

## 2012-03-19 ENCOUNTER — Ambulatory Visit (INDEPENDENT_AMBULATORY_CARE_PROVIDER_SITE_OTHER): Payer: Medicare Other | Admitting: Internal Medicine

## 2012-03-19 VITALS — BP 150/84 | HR 70 | Temp 98.0°F | Resp 16 | Wt 172.0 lb

## 2012-03-19 DIAGNOSIS — I10 Essential (primary) hypertension: Secondary | ICD-10-CM | POA: Diagnosis not present

## 2012-03-19 DIAGNOSIS — E785 Hyperlipidemia, unspecified: Secondary | ICD-10-CM

## 2012-03-19 DIAGNOSIS — Z86010 Personal history of colonic polyps: Secondary | ICD-10-CM

## 2012-03-19 DIAGNOSIS — E0590 Thyrotoxicosis, unspecified without thyrotoxic crisis or storm: Secondary | ICD-10-CM

## 2012-03-19 DIAGNOSIS — Z0000 Encounter for general adult medical examination without abnormal findings: Secondary | ICD-10-CM | POA: Insufficient documentation

## 2012-03-19 LAB — LIPID PANEL
Cholesterol: 156 mg/dL (ref 0–200)
Triglycerides: 42 mg/dL (ref 0.0–149.0)
VLDL: 8.4 mg/dL (ref 0.0–40.0)

## 2012-03-19 LAB — COMPREHENSIVE METABOLIC PANEL
Albumin: 3.9 g/dL (ref 3.5–5.2)
BUN: 16 mg/dL (ref 6–23)
Calcium: 9.6 mg/dL (ref 8.4–10.5)
Chloride: 106 mEq/L (ref 96–112)
GFR: 80.76 mL/min (ref >60.00)
Glucose, Bld: 85 mg/dL (ref 70–99)
Potassium: 3.5 mEq/L (ref 3.5–5.1)

## 2012-03-19 LAB — HEPATIC FUNCTION PANEL
Bilirubin, Direct: 0.1 mg/dL (ref 0.0–0.3)
Total Bilirubin: 0.8 mg/dL (ref 0.3–1.2)

## 2012-03-19 LAB — T3, FREE: T3, Free: 2.8 pg/mL (ref 2.3–4.2)

## 2012-03-19 LAB — T4, FREE: Free T4: 0.99 ng/dL (ref 0.60–1.60)

## 2012-03-19 MED ORDER — LISINOPRIL-HYDROCHLOROTHIAZIDE 20-25 MG PO TABS: 1.0000 | ORAL_TABLET | Freq: Every day | ORAL | Status: DC

## 2012-03-19 MED ORDER — DILTIAZEM HCL ER BEADS 240 MG PO CP24: 240.0000 mg | ORAL_CAPSULE | Freq: Every day | ORAL | Status: DC

## 2012-03-19 MED ORDER — SIMVASTATIN 40 MG PO TABS: 40.0000 mg | ORAL_TABLET | Freq: Every day | ORAL | Status: DC

## 2012-03-19 NOTE — Assessment & Plan Note (Signed)
BP Readings from Last 3 Encounters:  03/19/12 150/84  11/22/11 190/92  04/17/11 168/82   Better than before but not quite at goal.  Plan - continue present medication  Monitor BP at home or drugstore and call if consistently 150 or higher.

## 2012-03-19 NOTE — Assessment & Plan Note (Signed)
Interval medical history is benign. Physical exam,sans breast and pelvic, is normal. Current with colorectal and breast cancer screening. Immunizations are current and she will research her coverage for shingles vaccine.  In summary - a delightful woman who is medically stable. She will be seeing Dr. Nile Riggs for follow-up of cataracts. She will resume her water aerobics. She will consider the Death with Dignity/Advance Care planing documents provided. She will return in 1 year or as needed.

## 2012-03-19 NOTE — Assessment & Plan Note (Addendum)
For follow-up lab today with recommendations to follow  Addendum: TSH is low consistent with hyperthyroidism, but serum thyroid levels are in normal range. No indication for medical therapy.

## 2012-03-19 NOTE — Assessment & Plan Note (Addendum)
Doing well, tolerating medication w/o adverse side effects  Plan Lab today with recommendations to follow  Renewed Rx  Addendum: excellent control with LDL better than goal of 100 or less  Plan Continue present medication

## 2012-03-19 NOTE — Progress Notes (Signed)
Subjective:    Patient ID: Deborah Myers, female    DOB: [birth year], 76 y.o.   MRN: [id number]  HPI The patient is here for annual Medicare wellness examination and management of other chronic and acute problems. She reports that she has not been doing her water aerobics   The risk factors are reflected in the social history.  The roster of all physicians providing medical care to patient - is listed in the Snapshot section of the chart.  Activities of daily living:  The patient is 100% inedpendent in all ADLs: dressing, toileting, feeding as well as independent mobility  Home safety : The patient has smoke detectors in the home. Fall risk - house is safe, has grab bars in bathroom. They wear seatbelts. No firearms at home   There is no risks for hepatitis, STDs or HIV. There is no   history of blood transfusion. They have no travel history to infectious disease endemic areas of the world.  The patient has seen their dentist in the last 12 month. They have not seen their eye doctor in the last year. They deny any hearing difficulty and have not had audiologic testing in the last year.  They do not  have excessive sun exposure. Discussed the need for sun protection: hats, long sleeves and use of sunscreen if there is significant sun exposure.   Diet: the importance of a healthy diet is discussed. They do have a healthy diet.  The patient has a regular exercise program: water aerobics , 45 min duration, 3 per week.  The benefits of regular aerobic exercise were discussed.  Depression screen: there are no signs or vegative symptoms of depression- irritability, change in appetite, anhedonia, sadness/tearfullness.  Cognitive assessment: the patient manages all their financial and personal affairs and is actively engaged.   The following portions of the patient's history were reviewed and updated as appropriate: allergies, current medications, past family history, past medical history,   past surgical history, past social history  and problem list.  Vision, hearing, body mass index were assessed and reviewed.   During the course of the visit the patient was educated and counseled about appropriate screening and preventive services including : fall prevention , diabetes screening, nutrition counseling, colorectal cancer screening, and recommended immunizations.  Past Medical History  Diagnosis Date  . Hyperlipidemia   . HTN (hypertension)   . Cecum mass 2008    Benign   . Arthritis   . Cataract   . Osteoporosis     no per pt   Past Surgical History  Procedure Date  . Colectomy 2008    Benign mass - laparoscopic right  . Colonoscopy    Family History  Problem Relation Age of Onset  . Coronary artery disease Mother   . Diabetes Mother   . Heart disease Mother     CHF  . Coronary artery disease Father   . Diabetes Father   . Heart disease Father   . Diabetes Sister   . Diabetes Brother   . Cancer Neg Hx   . COPD Neg Hx   . Colon cancer Neg Hx   . Esophageal cancer Neg Hx   . Stomach cancer Neg Hx   . Rectal cancer Neg Hx    History   Social History  . Marital Status: Single    Spouse Name: N/A    Number of Children: N/A  . Years of Education: N/A   Occupational History  . RETIRED [name]  Social History Main Topics  . Smoking status: Never Smoker   . Smokeless tobacco: Never Used  . Alcohol Use: 6.2 oz/week    4 Drinks containing 0.5 oz of alcohol, 7 Glasses of wine per week  . Drug Use: No  . Sexually Active: Not Currently   Other Topics Concern  . Not on file   Social History Narrative   HSG, [employer]. Never married. No children. Work - retired from [place] [employer] after 34 years. Lives alone.End of Life Care - interested in this. Provided a packet -May 29th, 2012.       Review of Systems Constitutional:  Negative for fever, chills, activity change and unexpected weight change.  HEENT:  Negative for hearing loss, ear pain, congestion, neck  stiffness and postnasal drip. Negative for sore throat or swallowing problems. Negative for dental complaints.   Eyes: Negative for vision loss or change in visual acuity.  Respiratory: Negative for chest tightness and wheezing. Negative for DOE.   Cardiovascular: Negative for chest pain or palpitations. No decreased exercise tolerance Gastrointestinal: No change in bowel habit. No bloating or gas. No reflux or indigestion Genitourinary: Negative for urgency, frequency, flank pain and difficulty urinating.  Musculoskeletal: Negative for myalgias, back pain, arthralgias and gait problem.  Neurological: Negative for dizziness, tremors, weakness and headaches.  Hematological: Negative for adenopathy.  Psychiatric/Behavioral: Negative for behavioral problems and dysphoric mood.       Objective:   Physical Exam Filed Vitals:   03/19/12 0906  BP: 150/84  Pulse: 70  Temp: 98 F (36.7 C)  Resp: 16   Wt Readings from Last 3 Encounters:  03/19/12 172 lb (78.019 kg)  11/22/11 170 lb (77.111 kg)  11/08/11 170 lb 4.8 oz (77.248 kg)    Gen'l: well nourished, well developed AA woman in no distress HEENT - [place]/AT, EACs/TMs normal, oropharynx with full upper denture and residual native dentition mandible in good condition, no buccal or palatal lesions, posterior pharynx clear, mucous membranes moist. C&S clear, PERRLA, fundi - normal Neck - supple, no thyromegaly Nodes- negative submental, cervical, supraclavicular regions Chest - no deformity, no CVAT Lungs - cleat without rales, wheezes. No increased work of breathing Breast - deferred to mammogram Cardiovascular - regular rate and rhythm, quiet precordium, no murmurs, rubs or gallops, 2+ radial, DP and PT pulses Abdomen - BS+ x 4, no HSM, no guarding or rebound or tenderness Pelvic - deferred  Rectal - deferred to colonoscopy done in Jan '13 Extremities - no clubbing, cyanosis, edema or deformity.  Neuro - A&O x 3, CN II-XII normal, motor  strength normal and equal, DTRs 2+ and symmetrical biceps, radial, and patellar tendons. Cerebellar - no tremor, no rigidity, fluid movement and normal gait. Derm - Head, neck, back, abdomen and extremities without suspicious lesions  Lab Results  Component Value Date   WBC 4.4* 04/17/2011   HGB 13.4 04/17/2011   HCT 40.1 04/17/2011   PLT 310.0 04/17/2011   GLUCOSE 85 03/19/2012   CHOL 156 03/19/2012   TRIG 42.0 03/19/2012   HDL 60.20 03/19/2012   LDLCALC 87 03/19/2012         ALT 13 03/19/2012   AST 20 03/19/2012         NA 142 03/19/2012   K 3.5 03/19/2012   CL 106 03/19/2012   CREATININE 0.9 03/19/2012   BUN 16 03/19/2012   CO2 27 03/19/2012   TSH 0.03* 03/19/2012        FT4  0.99  (nl)       FT3                        2.8   (nl)       Assessment & Plan:

## 2012-03-20 NOTE — Assessment & Plan Note (Addendum)
Last colonoscopy '13  Plan - for follow-up colonoscopy 7 years

## 2012-03-21 ENCOUNTER — Encounter: Payer: Self-pay | Admitting: Internal Medicine

## 2012-03-25 ENCOUNTER — Encounter: Payer: Self-pay | Admitting: Internal Medicine

## 2012-05-01 DIAGNOSIS — H2510 Age-related nuclear cataract, unspecified eye: Secondary | ICD-10-CM | POA: Diagnosis not present

## 2012-09-29 ENCOUNTER — Ambulatory Visit (INDEPENDENT_AMBULATORY_CARE_PROVIDER_SITE_OTHER): Payer: Medicare Other | Admitting: Internal Medicine

## 2012-09-29 ENCOUNTER — Encounter: Payer: Self-pay | Admitting: Internal Medicine

## 2012-09-29 ENCOUNTER — Telehealth: Payer: Self-pay | Admitting: Internal Medicine

## 2012-09-29 VITALS — BP 144/80 | HR 95 | Temp 98.9°F | Resp 16 | Wt 169.0 lb

## 2012-09-29 DIAGNOSIS — G500 Trigeminal neuralgia: Secondary | ICD-10-CM | POA: Diagnosis not present

## 2012-09-29 MED ORDER — CARBAMAZEPINE ER 100 MG PO TB12: 100.0000 mg | ORAL_TABLET | Freq: Two times a day (BID) | ORAL | Status: DC

## 2012-09-29 NOTE — Telephone Encounter (Signed)
Deborah Myers calling because she developed a headache on 09/26/12.  Started on the lower part of her head on the right side.  Continuing to have a headache today around her right ear.  Afebrile.  Rates pain as mild.  Has not taken anything for pain.  Headache worsens upon turning her head to the right.  Utilized Headache Guideline.  See PCP within 72 hrs due to "headache is triggered by movement or positioning of head or neck".  Scheduled appt for today at 1630 with Dr. Debby Bud.

## 2012-09-29 NOTE — Patient Instructions (Addendum)
Facial pain around the ear and side of the face - the nature and duration of the pain, along with the twitch it causes, is very suggestive of Trigeminal Neuralgia - a Cranial nerve related pain of unknown origin in most patients. See handout  Plan  Carbamazipine 100 mg - start taking this twice a day. After two days if there is still pain increase to 200 mg twice a day and we can go as high as 400 mg twice a day if we need to.   For lack of relief call.  Return for follow up in 1 week. The office will call you with an appointment.   Trigeminal Neuralgia Trigeminal neuralgia is a nerve disorder that causes sudden attacks of severe facial pain. It is caused by damage to the trigeminal nerve, a major nerve in the face. It is more common in women and in the elderly, although it can also happen in younger patients. Attacks last from a few seconds to several minutes and can occur from a couple of times per year to several times per day. Trigeminal neuralgia can be a very distressing and disabling condition. Surgery may be needed in very severe cases if medical treatment does not give relief. HOME CARE INSTRUCTIONS    If your caregiver prescribed medication to help prevent attacks, take as directed.   To help prevent attacks:   Chew on the unaffected side of the mouth.   Avoid touching your face.   Avoid blasts of hot or cold air.   Men may wish to grow a beard to avoid having to shave.  SEEK IMMEDIATE MEDICAL CARE IF:  Pain is unbearable and your medicine does not help.   You develop new, unexplained symptoms (problems).   You have problems that may be related to a medication you are taking.  Document Released: 11/02/2000 Document Revised: 01/28/2012 Document Reviewed: 09/02/2009 [facility] Patient Information 2013 [place], [place].

## 2012-09-29 NOTE — Progress Notes (Signed)
Subjective:    Patient ID: Deborah Myers, female    DOB: [birth year], 76 y.o.   MRN: [id number]  HPI Deborah Myers presents for a several day h/o pain that starts behind the right ear, at the ankle of the jaw with radiation around the top of the ear to the parietal area.  The pain can be lancinating and makes her jump. She has no pain with chewing or eating. She has had no double vision, nausea, paresthesia or weakness. No tinnitus, no deafness.  Past Medical History  Diagnosis Date  . Hyperlipidemia   . HTN (hypertension)   . Cecum mass 2008    Benign   . Arthritis   . Cataract   . Osteoporosis     no per pt   Past Surgical History  Procedure Date  . Colectomy 2008    Benign mass - laparoscopic right  . Colonoscopy    Family History  Problem Relation Age of Onset  . Coronary artery disease Mother   . Diabetes Mother   . Heart disease Mother     CHF  . Coronary artery disease Father   . Diabetes Father   . Heart disease Father   . Diabetes Sister   . Diabetes Brother   . Cancer Neg Hx   . COPD Neg Hx   . Colon cancer Neg Hx   . Esophageal cancer Neg Hx   . Stomach cancer Neg Hx   . Rectal cancer Neg Hx    History   Social History  . Marital Status: Single    Spouse Name: N/A    Number of Children: N/A  . Years of Education: 16   Occupational History  . RETIRED [name]   Social History Main Topics  . Smoking status: Never Smoker   . Smokeless tobacco: Never Used  . Alcohol Use: 6.2 oz/week    4 Drinks containing 0.5 oz of alcohol, 7 Glasses of wine per week  . Drug Use: No  . Sexually Active: Not Currently   Other Topics Concern  . Not on file   Social History Narrative   HSG, [employer]. Never married. No children. Work - retired from [place] after 34 years. Lives alone.End of Life Care - interested in this. Provided a packet -Mar 19, 2012.    Current Outpatient Prescriptions on File Prior to Visit  Medication Sig Dispense Refill  . Ascorbic Acid  (VITAMIN C) 1000 MG tablet Take 1,000 mg by mouth daily.        . Calcium Carbonate-Vitamin D (CALCIUM-VITAMIN D) 600-200 MG-UNIT CAPS Take by mouth daily.        . COD LIVER OIL PO Take by mouth daily.        [name] diltiazem (TIAZAC) 240 MG 24 hr capsule Take 1 capsule (240 mg total) by mouth daily.  30 capsule  11  . lisinopril-hydrochlorothiazide (PRINZIDE,ZESTORETIC) 20-25 MG per tablet Take 1 tablet by mouth daily.  30 tablet  11  . Multiple Vitamins-Minerals (MULTIVITAMIN,TX-MINERALS) tablet Take 1 tablet by mouth daily.        . simvastatin (ZOCOR) 40 MG tablet Take 1 tablet (40 mg total) by mouth at bedtime.  30 tablet  11   Current Facility-Administered Medications on File Prior to Visit  Medication Dose Route Frequency Provider Last Rate Last Dose  . pneumococcal 23 valent vaccine (PNU-IMMUNE) injection 0.5 mL  0.5 mL Intramuscular Once [name], MD           Review  of Systems System review is negative for any constitutional, cardiac, pulmonary, GI or neuro symptoms or complaints other than as described in the HPI.     Objective:   Physical Exam Filed Vitals:   09/29/12 1651  BP: 144/80  Pulse: 95  Temp: 98.9 F (37.2 C)  Resp: 16   Gen'l - WNWD AA woman in no acute distress HEENT - no tenderness to percussion over the frontal or maxillary sinus. EAC/TM normal on the right. No tenderness on palpation of the TMJ.  Nodes - no abnormal nodes Cor- RRR PUlm - normal respirations. Neuro - CN II-XII intact, normal facial muscle movement.        Assessment & Plan:

## 2012-09-30 DIAGNOSIS — G500 Trigeminal neuralgia: Secondary | ICD-10-CM | POA: Insufficient documentation

## 2012-09-30 NOTE — Assessment & Plan Note (Signed)
Facial pain around the ear and side of the face - the nature and duration of the pain, along with the twitch it causes, is very suggestive of Trigeminal Neuralgia - a Cranial nerve related pain of unknown origin in most patients. See handout  Plan  Carbamazipine 100 mg - start taking this twice a day. After two days if there is still pain increase to 200 mg twice a day and we can go as high as 400 mg twice a day if we need to.   For lack of relief call.  Return for follow up in 1 week. The office will call you with an appointment.

## 2012-10-05 ENCOUNTER — Emergency Department (HOSPITAL_COMMUNITY): Payer: Medicare Other

## 2012-10-05 ENCOUNTER — Emergency Department (HOSPITAL_COMMUNITY)
Admission: EM | Admit: 2012-10-05 | Discharge: 2012-10-05 | Disposition: A | Discharge status: Home / Self Care | Payer: Medicare Other | Attending: Emergency Medicine | Admitting: Emergency Medicine

## 2012-10-05 ENCOUNTER — Encounter (HOSPITAL_COMMUNITY): Payer: Self-pay | Admitting: *Deleted

## 2012-10-05 DIAGNOSIS — Z79899 Other long term (current) drug therapy: Secondary | ICD-10-CM | POA: Insufficient documentation

## 2012-10-05 DIAGNOSIS — B029 Zoster without complications: Secondary | ICD-10-CM

## 2012-10-05 DIAGNOSIS — M810 Age-related osteoporosis without current pathological fracture: Secondary | ICD-10-CM | POA: Diagnosis not present

## 2012-10-05 DIAGNOSIS — E860 Dehydration: Secondary | ICD-10-CM | POA: Diagnosis not present

## 2012-10-05 DIAGNOSIS — H269 Unspecified cataract: Secondary | ICD-10-CM | POA: Diagnosis not present

## 2012-10-05 DIAGNOSIS — E876 Hypokalemia: Secondary | ICD-10-CM

## 2012-10-05 DIAGNOSIS — Z8739 Personal history of other diseases of the musculoskeletal system and connective tissue: Secondary | ICD-10-CM | POA: Insufficient documentation

## 2012-10-05 DIAGNOSIS — E785 Hyperlipidemia, unspecified: Secondary | ICD-10-CM | POA: Insufficient documentation

## 2012-10-05 DIAGNOSIS — N289 Disorder of kidney and ureter, unspecified: Secondary | ICD-10-CM | POA: Diagnosis not present

## 2012-10-05 DIAGNOSIS — I10 Essential (primary) hypertension: Secondary | ICD-10-CM | POA: Insufficient documentation

## 2012-10-05 DIAGNOSIS — R0602 Shortness of breath: Secondary | ICD-10-CM | POA: Diagnosis not present

## 2012-10-05 DIAGNOSIS — Z8719 Personal history of other diseases of the digestive system: Secondary | ICD-10-CM | POA: Insufficient documentation

## 2012-10-05 LAB — POCT I-STAT, CHEM 8
Creatinine, Ser: 1.2 mg/dL — ABNORMAL HIGH (ref 0.50–1.10)
HCT: 45 % (ref 36.0–46.0)
Hemoglobin: 15.3 g/dL — ABNORMAL HIGH (ref 12.0–15.0)
Potassium: 2.9 mEq/L — ABNORMAL LOW (ref 3.5–5.1)
Sodium: 137 mEq/L (ref 135–145)
TCO2: 26 mmol/L (ref 0–100)

## 2012-10-05 LAB — D-DIMER, QUANTITATIVE: D-Dimer, Quant: 0.99 ug/mL-FEU — ABNORMAL HIGH (ref 0.00–0.48)

## 2012-10-05 MED ORDER — SODIUM CHLORIDE 0.9 % IV BOLUS (SEPSIS)
500.0000 mL | Freq: Once | INTRAVENOUS | Status: AC
  Administered 2012-10-05: 500 mL via INTRAVENOUS

## 2012-10-05 MED ORDER — SODIUM CHLORIDE 0.9 % IV SOLN: Freq: Once | INTRAVENOUS | Status: DC

## 2012-10-05 MED ORDER — ACYCLOVIR 800 MG PO TABS: ORAL_TABLET | ORAL | Status: DC

## 2012-10-05 MED ORDER — POTASSIUM CHLORIDE CRYS ER 20 MEQ PO TBCR: 20.0000 meq | EXTENDED_RELEASE_TABLET | Freq: Two times a day (BID) | ORAL | Status: DC

## 2012-10-05 MED ORDER — TECHNETIUM TO 99M ALBUMIN AGGREGATED
4.5000 | Freq: Once | INTRAVENOUS | Status: AC | PRN
  Administered 2012-10-05: 5 via INTRAVENOUS

## 2012-10-05 NOTE — ED Notes (Signed)
Pt was seen by pcp for facial/ ear pain, dx with trigeminal neuralgia. Pt prescribed carbamaxepine. Started taking medication Tuesday, on Wednesday pt had one blister on right side of her neck. At present mulitple blisters on right side of neck and face. Pt reprots she was SOB this morning. At present reports she is a little short of breath. Denies pain.

## 2012-10-05 NOTE — ED Provider Notes (Signed)
History     CSN: [id number]  Arrival date & time 10/05/12  1123   First MD Initiated Contact with Patient 10/05/12 1145      Chief Complaint  Patient presents with  . Shortness of Breath  . possible allergic reaction to medication     (Consider location/radiation/quality/duration/timing/severity/associated sxs/prior treatment) HPI  Patient reports about 8 days ago she started having pain in the right side of her face/neck. She was seen by her physician Dr. Debby Bud on November 11 and was diagnosed with trigeminal neuralgia and started on carbamazepine. She reports the following day there was one blister on her face, the next day there was another blister and as the days progress she was getting more blisters. She denies itching or pain in the lesions. She denies any fever. She has an appointment to be rechecked by him tomorrow at 1:30 PM. However this morning she was getting out of bed and had acute severe shortness of breath that lasted about 30 minutes. She states she had to have her sister come help her get dressed because her breathing was so labored. She denies cough, chest pain, wheezing. She states she's never felt this way before.  PCP Dr. Debby Bud  Past Medical History  Diagnosis Date  . Hyperlipidemia   . HTN (hypertension)   . Cecum mass 2008    Benign   . Arthritis   . Cataract   . Osteoporosis     no per pt    Past Surgical History  Procedure Date  . Colectomy 2008    Benign mass - laparoscopic right  . Colonoscopy     Family History  Problem Relation Age of Onset  . Coronary artery disease Mother   . Diabetes Mother   . Heart disease Mother     CHF  . Coronary artery disease Father   . Diabetes Father   . Heart disease Father   . Diabetes Sister   . Diabetes Brother   . Cancer Neg Hx   . COPD Neg Hx   . Colon cancer Neg Hx   . Esophageal cancer Neg Hx   . Stomach cancer Neg Hx   . Rectal cancer Neg Hx     History  Substance Use Topics  .  Smoking status: Never Smoker   . Smokeless tobacco: Never Used  . Alcohol Use: 6.2 oz/week    4 Drinks containing 0.5 oz of alcohol, 7 Glasses of wine per week  Lives at home Lives alone  OB History    Grav Para Term Preterm Abortions TAB SAB Ect Mult Living                  Review of Systems  Allergies  Tegretol  Home Medications   Current Outpatient Rx  Name  Route  Sig  Dispense  Refill  . VITAMIN C 1000 MG PO TABS   Oral   Take 1,000 mg by mouth daily.           [name] CALCIUM-VITAMIN D 600-200 MG-UNIT PO CAPS   Oral   Take 1 tablet by mouth daily.          [name] CARBAMAZEPINE ER 100 MG PO TB12   Oral   Take 100 mg by mouth 2 (two) times daily. May increase as directed         . COD LIVER OIL PO   Oral   Take by mouth daily.           [name]  DILTIAZEM HCL ER BEADS 240 MG PO CP24   Oral   Take 1 capsule (240 mg total) by mouth daily.   30 capsule   11   . LISINOPRIL-HYDROCHLOROTHIAZIDE 20-25 MG PO TABS   Oral   Take 1 tablet by mouth daily.   30 tablet   11   . SUPER HIGH VITAMINS/MINERALS PO TABS   Oral   Take 1 tablet by mouth daily.           [name] SIMVASTATIN 40 MG PO TABS   Oral   Take 1 tablet (40 mg total) by mouth at bedtime.   30 tablet   11     BP 102/61  Pulse 118  Temp 97.9 F (36.6 C) (Oral)  Resp 16  SpO2 100%  Vital signs normal  Except tachycardia  Physical Exam  Nursing note and vitals reviewed. Constitutional: She is oriented to person, place, and time. She appears well-developed and well-nourished.  Non-toxic appearance. She does not appear ill. No distress.  HENT:  Head: Normocephalic and atraumatic.    Right Ear: External ear normal.  Left Ear: External ear normal.  Nose: Nose normal. No mucosal edema or rhinorrhea.  Mouth/Throat: Oropharynx is clear and moist and mucous membranes are normal. No dental abscesses or uvula swelling.       Area of rash noted  Eyes: Conjunctivae normal and EOM are normal. Pupils are  equal, round, and reactive to light.  Neck: Normal range of motion and full passive range of motion without pain. Neck supple.  Cardiovascular: Normal rate, regular rhythm and normal heart sounds.  Exam reveals no gallop and no friction rub.   No murmur heard. Pulmonary/Chest: Effort normal and breath sounds normal. No respiratory distress. She has no wheezes. She has no rhonchi. She has no rales. She exhibits no tenderness and no crepitus.  Abdominal: Soft. Normal appearance and bowel sounds are normal. She exhibits no distension. There is no tenderness. There is no rebound and no guarding.  Musculoskeletal: Normal range of motion. She exhibits no edema and no tenderness.       Moves all extremities well.   Neurological: She is alert and oriented to person, place, and time. She has normal strength. No cranial nerve deficit.  Skin: Skin is warm, dry and intact. Rash noted. No erythema. No pallor.       Patient has clusters of clear fluid filled blisters on a red base consistent with shingles or herpes zoster. It starts in the midline of her posterior neck involves the area around her ear and down on the right side of her neck to the midline  Psychiatric: She has a normal mood and affect. Her speech is normal and behavior is normal. Her mood appears not anxious.    ED Course  Procedures (including critical care time)  16:10 pt denies any SOB now, will place on monitor to see if she has A flutter.   16:20 Monitor looks like NSR, (her EKG could have been atrial flutter). Feels ready to go home.    Results for orders placed during the hospital encounter of 10/05/12  D-DIMER, QUANTITATIVE      Component Value Range   D-Dimer, Quant 0.99 (*) 0.00 - 0.48 ug/mL-FEU  POCT I-STAT, CHEM 8      Component Value Range   Sodium 137  135 - 145 mEq/L   Potassium 2.9 (*) 3.5 - 5.1 mEq/L   Chloride 97  96 - 112 mEq/L   BUN 20  6 - 23 mg/dL   Creatinine, Ser 1.19 (*) 0.50 - 1.10 mg/dL   Glucose, Bld 147  (*) 70 - 99 mg/dL   Calcium, Ion 8.29  5.62 - 1.30 mmol/L   TCO2 26  0 - 100 mmol/L   Hemoglobin 15.3 (*) 12.0 - 15.0 g/dL   HCT 13.0  86.5 - 78.4 %  TROPONIN I      Component Value Range   Troponin I <0.30  <0.30 ng/mL   Laboratory interpretation all normal except hypokalemia, elevated Ddimer, new renal insufficiency   Dg Chest 2 View  10/05/2012  *RADIOLOGY REPORT*  Clinical Data: Shortness of breath  CHEST - 2 VIEW  Comparison: 01/03/2007  Findings: Lungs are clear. No pleural effusion or pneumothorax.  Cardiomediastinal silhouette is within normal limits.  Mild degenerative changes of the visualized thoracolumbar spine.  IMPRESSION: No evidence of acute cardiopulmonary disease.   Original Report Authenticated By: Charline Bills, M.D.    Nm Pulmonary Perfusion  10/05/2012  *RADIOLOGY REPORT*  Clinical Data: Shortness of breath.  Elevated D-dimer.  NM PULMONARY PERFUSION PARTICULATE  Radiopharmaceutical: CURIE MAA TECHNETIUM TO 55M ALBUMIN AGGREGATED  Comparison: No priors.No priors.  Findings: No perfusion deficits are identified.  IMPRESSION: Negative for pulmonary embolism.   Original Report Authenticated By: Trudie Reed, M.D.       Date: 10/05/2012  Rate: 106  Rhythm: sinus tachycardia  QRS Axis: normal  Intervals: normal  ST/T Wave abnormalities: nonspecific ST changes  Conduction Disutrbances:LVH  Narrative Interpretation:   Old EKG Reviewed: changes noted from 04/08/2007 HR was 60     1. Herpes zoster   2. Hypokalemia   3. Renal insufficiency   4. Dehydration     New Prescriptions   ACYCLOVIR (ZOVIRAX) 800 MG TABLET    Take 1 po 5 times a day x 7 days   POTASSIUM CHLORIDE SA (K-DUR,KLOR-CON) 20 MEQ TABLET    Take 1 tablet (20 mEq total) by mouth 2 (two) times daily.    Plan discharge  Devoria Albe, MD, FACEP   MDM           Ward Givens, MD 10/05/12 [phone]

## 2012-10-06 ENCOUNTER — Encounter: Payer: Self-pay | Admitting: Internal Medicine

## 2012-10-06 ENCOUNTER — Ambulatory Visit (INDEPENDENT_AMBULATORY_CARE_PROVIDER_SITE_OTHER): Payer: Medicare Other | Admitting: Internal Medicine

## 2012-10-06 VITALS — BP 110/64 | HR 113 | Temp 98.6°F | Resp 12 | Wt 161.0 lb

## 2012-10-06 DIAGNOSIS — B029 Zoster without complications: Secondary | ICD-10-CM

## 2012-10-06 MED ORDER — VALACYCLOVIR HCL 1 G PO TABS: 1000.0000 mg | ORAL_TABLET | Freq: Three times a day (TID) | ORAL | Status: DC

## 2012-10-06 MED ORDER — GABAPENTIN 300 MG PO CAPS: 300.0000 mg | ORAL_CAPSULE | Freq: Three times a day (TID) | ORAL | Status: DC

## 2012-10-06 MED ORDER — PREDNISONE 20 MG PO TABS: 20.0000 mg | ORAL_TABLET | Freq: Every day | ORAL | Status: DC

## 2012-10-06 NOTE — Progress Notes (Signed)
  Subjective:    Patient ID: Deborah Myers, female    DOB: 21-Mar-1936, 76 y.o.   MRN: [id number]  HPI Patient seen last week for head and neck pain thought to be trigeminal neuralgia. As it turns out this is zoster.  PMH, FamHx and SocHx reviewed for any changes and relevance.  Current Outpatient Prescriptions on File Prior to Visit  Medication Sig Dispense Refill  . acyclovir (ZOVIRAX) 800 MG tablet Take 1 po 5 times a day x 7 days  35 tablet  0  . Ascorbic Acid (VITAMIN C) 1000 MG tablet Take 1,000 mg by mouth daily.        . Calcium Carbonate-Vitamin D (CALCIUM-VITAMIN D) 600-200 MG-UNIT CAPS Take 1 tablet by mouth daily.       . carbamazepine (TEGRETOL XR) 100 MG 12 hr tablet Take 100 mg by mouth 2 (two) times daily. May increase as directed      . COD LIVER OIL PO Take by mouth daily.        [name] diltiazem (TIAZAC) 240 MG 24 hr capsule Take 1 capsule (240 mg total) by mouth daily.  30 capsule  11  . lisinopril-hydrochlorothiazide (PRINZIDE,ZESTORETIC) 20-25 MG per tablet Take 1 tablet by mouth daily.  30 tablet  11  . Multiple Vitamins-Minerals (MULTIVITAMIN,TX-MINERALS) tablet Take 1 tablet by mouth daily.        . potassium chloride SA (K-DUR,KLOR-CON) 20 MEQ tablet Take 1 tablet (20 mEq total) by mouth 2 (two) times daily.  10 tablet  0  . simvastatin (ZOCOR) 40 MG tablet Take 1 tablet (40 mg total) by mouth at bedtime.  30 tablet  11   Current Facility-Administered Medications on File Prior to Visit  Medication Dose Route Frequency Provider Last Rate Last Dose  . pneumococcal 23 valent vaccine (PNU-IMMUNE) injection 0.5 mL  0.5 mL Intramuscular Once [name], MD      . [address] sodium chloride 0.9 % bolus 500 mL  500 mL Intravenous Once Ward Givens, MD   500 mL at 10/05/12 1321  . [COMPLETED] sodium chloride 0.9 % bolus 500 mL  500 mL Intravenous Once Ward Givens, MD   500 mL at 10/05/12 1322  . [COMPLETED] technetium albumin aggregated (MAA) injection solution 5 milli  Curie  5 milli Curie Intravenous Once PRN Medication Radiologist, MD   5 milli Curie at 10/05/12 1526  . [DISCONTINUED] 0.9 %  sodium chloride infusion   Intravenous Once Ward Givens, MD          Review of Systems System review is negative for any constitutional, cardiac, pulmonary, GI or neuro symptoms or complaints other than as described in the HPI.     Objective:   Physical Exam Filed Vitals:   10/06/12 1208  BP: 110/64  Pulse: 113  Temp: 98.6 F (37 C)  Resp: 12   Gen'l- WNWD AA woman Derm - characteristic rash on right neck and above ear       Assessment & Plan:  Shingles Plan  Valtrex 1000 mg 3 times a day for 7 days  Prednisone 20 mg daily for 10 days.  Gabapentin 300 mg once today, twice a day tomorrow, three times a day Weds, 600 mg 3 times a day after that.

## 2012-10-06 NOTE — Patient Instructions (Addendum)
Shingles  Plan  Valtrex 1000 mg 3 times a day for 7 days  Prednisone 20 mg daily for 10 days.  Gabapentin 300 mg once today, twice a day tomorrow, three times a day Weds, 600 mg 3 times a day after that.  May use [place] soaks as a poltice if the rash starts to drain. Ask the pharmacist.   Shingles Shingles is caused by the same virus that causes chickenpox (varicella zoster virus or VZV). Shingles often occurs many years or decades after having chickenpox. That is why it is more common in adults older than 50 years. The virus reactivates and breaks out as an infection in a nerve root. SYMPTOMS    The initial feeling (sensations) may be pain. This pain is usually described as:   Burning.   Stabbing.   Throbbing.   Tingling in the nerve root.   A red rash will follow in a couple days. The rash may occur in any area of the body and is usually on one side (unilateral) of the body in a band or belt-like pattern. The rash usually starts out as very small blisters (vesicles). They will dry up after 7 to 10 days. This is not usually a significant problem except for the pain it causes.   Long-lasting (chronic) pain is more likely in an elderly person. It can last months to years. This condition is called postherpetic neuralgia.  Shingles can be an extremely severe infection in someone with AIDS, a weakened immune system, or with forms of leukemia. It can also be severe if you are taking transplant medicines or other medicines that weaken the immune system. TREATMENT   Your caregiver will often treat you with:  Antiviral drugs.   Anti-inflammatory drugs.   Pain medicines.  Bed rest is very important in preventing the pain associated with herpes zoster (postherpetic neuralgia). Application of heat in the form of a hot water bottle or electric heating pad or gentle pressure with the hand is recommended to help with the pain or discomfort. PREVENTION   A varicella zoster vaccine is available  to help protect against the virus. The Food and Drug Administration approved the varicella zoster vaccine for individuals 80 years of age and older. HOME CARE INSTRUCTIONS    Cool compresses to the area of rash may be helpful.   Only take over-the-counter or prescription medicines for pain, discomfort, or fever as directed by your caregiver.   Avoid contact with:   Babies.   Pregnant women.   Children with eczema.   Elderly people with transplants.   People with chronic illnesses, such as leukemia and AIDS.   If the area involved is on your face, you may receive a referral for follow-up to a specialist. It is very important to keep all follow-up appointments. This will help avoid eye complications, chronic pain, or disability.  SEEK IMMEDIATE MEDICAL CARE IF:    You develop any pain (headache) in the area of the face or eye. This must be followed carefully by your caregiver or ophthalmologist. An infection in part of your eye (cornea) can be very serious. It could lead to blindness.   You do not have pain relief from prescribed medicines.   Your redness or swelling spreads.   The area involved becomes very swollen and painful.   You have a fever.   You notice any red or painful lines extending away from the affected area toward your heart (lymphangitis).   Your condition is worsening or has  changed.  Document Released: 11/05/2005 Document Revised: 01/28/2012 Document Reviewed: 10/10/2009 [facility] Patient Information 2013 [place], [place].

## 2012-10-21 ENCOUNTER — Encounter: Payer: Self-pay | Admitting: Internal Medicine

## 2012-10-21 ENCOUNTER — Ambulatory Visit (INDEPENDENT_AMBULATORY_CARE_PROVIDER_SITE_OTHER): Payer: Medicare Other | Admitting: Internal Medicine

## 2012-10-21 VITALS — BP 130/64 | HR 89 | Temp 98.3°F | Resp 14 | Wt 166.2 lb

## 2012-10-21 DIAGNOSIS — B0229 Other postherpetic nervous system involvement: Secondary | ICD-10-CM | POA: Diagnosis not present

## 2012-10-21 DIAGNOSIS — B309 Viral conjunctivitis, unspecified: Secondary | ICD-10-CM | POA: Diagnosis not present

## 2012-10-21 NOTE — Progress Notes (Signed)
  Subjective:    Patient ID: Deborah Myers, female    DOB: 02-15-1936, 76 y.o.   MRN: [id number]  HPI Deborah Myers presents for follow up of Zoster with PHN in CN 5 distribution on the right scalp, behind the ear. The rash of zoster is resolving but she is still have sharp shooting pain but the gabapentin does help.  She reports that last Sunday she had a sniffle and then her eyes got very red. She denies and discharge from the eye, no pain in the eye, no change in vision.   Review of Systems     Objective:   Physical Exam Filed Vitals:   10/21/12 1116  BP: 130/64  Pulse: 89  Temp: 98.3 F (36.8 C)  Resp: 14   Gen'l- WNWD AA woman in no distress HEENT - injection of the bulbar conjunctiva, iris and lens are ok.  Cor- RRR Pulm - normal respirations. Derm - resolving vesicular lesions behind the right ear and the posterior scalp and the area in front of th ear and parietal scalp       Assessment & Plan:  1. Postherpetic neuralgia - the painful inflammation of the nerves that were infected by the Zoster virus, a pain that can be persistent. Plan - the lesions are healing nicely  For pain - continue the Gabapentin 600 mg three times a day  For itching - take Loratadine 10 mg twice a day and Ranitidine 150 mg twice a day. These are antihistamines and they are over the counter.  2. Red eyes - does not sound like it was bacterial (pink eye) but has acted like a VIRAL conjunctivitis.  Plan - moisturizing eye drops  Watch for any change in vision or pain.

## 2012-10-21 NOTE — Patient Instructions (Addendum)
Postherpetic neuralgia - the painful inflammation of the nerves that were infected by the Zoster virus, a pain that can be persistent. Plan - the lesions are healing nicely  For pain - continue the Gabapentin 600 mg three times a day  For itching - take Loratadine 10 mg twice a day and Ranitidine 150 mg twice a day. These are antihistamines and they are over the counter.  Red eyes - does not sound like it was bacterial (pink eye) but has acted like a VIRAL conjunctivitis.  Plan - moisturizing eye drops  Watch for any change in vision or pain.   Viral Conjunctivitis Conjunctivitis is an irritation (inflammation) of the clear membrane that covers the white part of the eye (the conjunctiva). The irritation can also happen on the underside of the eyelids. Conjunctivitis makes the eye red or pink in color. This is what is commonly known as pink eye. Viral conjunctivitis can spread easily (contagious). CAUSES    Infection from virus on the surface of the eye.   Infection from the irritation or injury of nearby tissues such as the eyelids or cornea.   More serious inflammation or infection on the inside of the eye.   Other eye diseases.   The use of certain eye medications.  SYMPTOMS   The normally white color of the eye or the underside of the eyelid is usually pink or red in color. The pink eye is usually associated with irritation, tearing and some sensitivity to light. Viral conjunctivitis is often associated with a clear, watery discharge. If a discharge is present, there may also be some blurred vision in the affected eye. DIAGNOSIS   Conjunctivitis is diagnosed by an eye exam. The eye specialist looks for changes in the surface tissues of the eye which take on changes characteristic of the specific types of conjunctivitis. A sample of any discharge may be collected on a Q-Tip (sterile swap). The sample will be sent to a lab to see whether or not the inflammation is caused by bacterial or  viral infection. TREATMENT   Viral conjunctivitis will not respond to medicines that kill germs (antibiotics). Treatment is aimed at stopping a bacterial infection on top of the viral infection. The goal of treatment is to relieve symptoms (such as itching) with antihistamine drops or other eye medications.   HOME CARE INSTRUCTIONS    To ease discomfort, apply a cool, clean wash cloth to your eye for 10 to 20 minutes, 3 to 4 times a day.   Gently wipe away any drainage from the eye with a warm, wet washcloth or a cotton ball.   Wash your hands often with soap and use paper towels to dry.   Do not share towels or washcloths. This may spread the infection.   Change or wash your pillowcase every day.   You should not use eye make-up until the infection is gone.   Stop using contacts lenses. Ask your eye professional how to sterilize or replace them before using again. This depends on the type of contact lenses used.   Do not touch the edge of the eyelid with the eye drop bottle or ointment tube when applying medications to the affected eye. This will stop you from spreading the infection to the other eye or to others.  SEEK IMMEDIATE MEDICAL CARE IF:    The infection has not improved within 3 days of beginning treatment.   A watery discharge from the eye develops.   Pain in the eye  increases.   The redness is spreading.   Vision becomes blurred.   An oral temperature above 102 F (38.9 C) develops, or as your caregiver suggests.   Facial pain, redness or swelling develops.   Any problems that may be related to the prescribed medicine develop.  MAKE SURE YOU:    Understand these instructions.   Will watch your condition.   Will get help right away if you are not doing well or get worse.  Document Released: 11/05/2005 Document Revised: 01/28/2012 Document Reviewed: 06/24/2008 [facility] Patient Information 2013 [place], [place].

## 2012-10-23 DIAGNOSIS — B0232 Zoster iridocyclitis: Secondary | ICD-10-CM | POA: Diagnosis not present

## 2012-10-30 DIAGNOSIS — B0232 Zoster iridocyclitis: Secondary | ICD-10-CM | POA: Diagnosis not present

## 2012-12-29 DIAGNOSIS — H2000 Unspecified acute and subacute iridocyclitis: Secondary | ICD-10-CM | POA: Diagnosis not present

## 2013-01-05 DIAGNOSIS — H2000 Unspecified acute and subacute iridocyclitis: Secondary | ICD-10-CM | POA: Diagnosis not present

## 2013-01-12 DIAGNOSIS — H2000 Unspecified acute and subacute iridocyclitis: Secondary | ICD-10-CM | POA: Diagnosis not present

## 2013-01-26 DIAGNOSIS — H2000 Unspecified acute and subacute iridocyclitis: Secondary | ICD-10-CM | POA: Diagnosis not present

## 2013-02-02 DIAGNOSIS — H2000 Unspecified acute and subacute iridocyclitis: Secondary | ICD-10-CM | POA: Diagnosis not present

## 2013-02-19 DIAGNOSIS — H2000 Unspecified acute and subacute iridocyclitis: Secondary | ICD-10-CM | POA: Diagnosis not present

## 2013-03-11 ENCOUNTER — Telehealth: Payer: Self-pay | Admitting: Internal Medicine

## 2013-03-11 ENCOUNTER — Encounter: Payer: Self-pay | Admitting: Internal Medicine

## 2013-03-11 ENCOUNTER — Other Ambulatory Visit (INDEPENDENT_AMBULATORY_CARE_PROVIDER_SITE_OTHER): Payer: Medicare Other

## 2013-03-11 ENCOUNTER — Ambulatory Visit (INDEPENDENT_AMBULATORY_CARE_PROVIDER_SITE_OTHER): Payer: Medicare Other | Admitting: Internal Medicine

## 2013-03-11 VITALS — BP 180/76 | HR 70 | Temp 97.4°F | Resp 12 | Wt 176.4 lb

## 2013-03-11 DIAGNOSIS — R609 Edema, unspecified: Secondary | ICD-10-CM

## 2013-03-11 DIAGNOSIS — E0590 Thyrotoxicosis, unspecified without thyrotoxic crisis or storm: Secondary | ICD-10-CM

## 2013-03-11 DIAGNOSIS — E876 Hypokalemia: Secondary | ICD-10-CM

## 2013-03-11 DIAGNOSIS — I10 Essential (primary) hypertension: Secondary | ICD-10-CM

## 2013-03-11 LAB — BASIC METABOLIC PANEL
CO2: 30 mEq/L (ref 19–32)
Calcium: 9.7 mg/dL (ref 8.4–10.5)
Creatinine, Ser: 0.8 mg/dL (ref 0.4–1.2)
GFR: 84.99 mL/min (ref >60.00)
Glucose, Bld: 76 mg/dL (ref 70–99)

## 2013-03-11 LAB — TSH: TSH: 0.02 u[IU]/mL — ABNORMAL LOW (ref 0.35–5.50)

## 2013-03-11 MED ORDER — POTASSIUM CHLORIDE CRYS ER 20 MEQ PO TBCR: 20.0000 meq | EXTENDED_RELEASE_TABLET | Freq: Every day | ORAL | Status: DC

## 2013-03-11 MED ORDER — FUROSEMIDE 40 MG PO TABS: 40.0000 mg | ORAL_TABLET | Freq: Every day | ORAL | Status: DC | PRN

## 2013-03-11 NOTE — Assessment & Plan Note (Signed)
Lbs KCL

## 2013-03-11 NOTE — Assessment & Plan Note (Signed)
Worse Added Lasix NAS diet

## 2013-03-11 NOTE — Progress Notes (Signed)
  Subjective:    Patient ID: Deborah Myers, female    DOB: [birth year], 77 y.o.   MRN: [id number]  HPI  C/o B LE swelling over past 2 wks. She has been snacking on potato chips, popcorn lately   Review of Systems  Constitutional: Positive for unexpected weight change. Negative for chills, activity change, appetite change and fatigue.  HENT: Negative for congestion, mouth sores and sinus pressure.   Eyes: Negative for visual disturbance.  Respiratory: Negative for cough, chest tightness, shortness of breath and wheezing.   Cardiovascular: Positive for leg swelling. Negative for chest pain and palpitations.  Gastrointestinal: Negative for nausea and abdominal pain.  Genitourinary: Negative for frequency, difficulty urinating and vaginal pain.  Musculoskeletal: Negative for back pain and gait problem.  Skin: Negative for pallor and rash.  Neurological: Negative for dizziness, tremors, weakness, numbness and headaches.  Psychiatric/Behavioral: Negative for confusion and sleep disturbance.    Wt Readings from Last 3 Encounters:  03/11/13 176 lb 6.4 oz (80.015 kg)  10/21/12 166 lb 4 oz (75.411 kg)  10/06/12 161 lb (73.029 kg)   BP Readings from Last 3 Encounters:  03/11/13 180/76  10/21/12 130/64  10/06/12 110/64        Objective:   Physical Exam  Constitutional: She appears well-developed. No distress.  HENT:  Head: Normocephalic.  Right Ear: External ear normal.  Left Ear: External ear normal.  Nose: Nose normal.  Mouth/Throat: Oropharynx is clear and moist.  Eyes: Conjunctivae are normal. Pupils are equal, round, and reactive to light. Right eye exhibits no discharge. Left eye exhibits no discharge.  Neck: Normal range of motion. Neck supple. No JVD present. No tracheal deviation present. No thyromegaly present.  Cardiovascular: Normal rate, regular rhythm and normal heart sounds.   Pulmonary/Chest: No stridor. No respiratory distress. She has no wheezes.  Abdominal:  Soft. Bowel sounds are normal. She exhibits no distension and no mass. There is no tenderness. There is no rebound and no guarding.  Musculoskeletal: She exhibits edema (1+ B). She exhibits no tenderness.  Lymphadenopathy:    She has no cervical adenopathy.  Neurological: She displays normal reflexes. No cranial nerve deficit. She exhibits normal muscle tone. Coordination normal.  Skin: No rash noted. No erythema.  Psychiatric: She has a normal mood and affect. Her behavior is normal. Judgment and thought content normal.   Lab Results  Component Value Date   WBC 4.4* 04/17/2011   HGB 15.3* 10/05/2012   HCT 45.0 10/05/2012   PLT 310.0 04/17/2011   GLUCOSE 115* 10/05/2012   CHOL 156 03/19/2012   TRIG 42.0 03/19/2012   HDL 60.20 03/19/2012   LDLCALC 87 03/19/2012   ALT 13 03/19/2012   ALT 13 03/19/2012   AST 20 03/19/2012   AST 20 03/19/2012   NA 137 10/05/2012   K 2.9* 10/05/2012   CL 97 10/05/2012   CREATININE 1.20* 10/05/2012   BUN 20 10/05/2012   CO2 27 03/19/2012   TSH 0.03* 03/19/2012          Assessment & Plan:

## 2013-03-11 NOTE — Assessment & Plan Note (Signed)
4/14 B LE - multifactorial: diltiazem, salt, dependent position (Sawing) Added furosemide/KCl NAS diet Labs

## 2013-03-11 NOTE — Telephone Encounter (Signed)
Endo ref

## 2013-03-18 ENCOUNTER — Ambulatory Visit (INDEPENDENT_AMBULATORY_CARE_PROVIDER_SITE_OTHER): Payer: Medicare Other | Admitting: Endocrinology

## 2013-03-18 ENCOUNTER — Encounter: Payer: Self-pay | Admitting: Endocrinology

## 2013-03-18 VITALS — BP 130/72 | HR 90 | Wt 170.0 lb

## 2013-03-18 DIAGNOSIS — E0590 Thyrotoxicosis, unspecified without thyrotoxic crisis or storm: Secondary | ICD-10-CM

## 2013-03-18 NOTE — Patient Instructions (Addendum)

## 2013-03-18 NOTE — Progress Notes (Signed)
Subjective:    Patient ID: Deborah Myers, female    DOB: Nov 12, 1936, 77 y.o.   MRN: [id number]  HPI Pt has few years h/o moderately suppressed TSH.  She denies tremor of the hands, or assoc weight loss. Past Medical History  Diagnosis Date  . Hyperlipidemia   . HTN (hypertension)   . Cecum mass 2008    Benign   . Arthritis   . Cataract   . Osteoporosis     no per pt    Past Surgical History  Procedure Laterality Date  . Colectomy  2008    Benign mass - laparoscopic right  . Colonoscopy      History   Social History  . Marital Status: Single    Spouse Name: N/A    Number of Children: N/A  . Years of Education: 16   Occupational History  . RETIRED [name]   Social History Main Topics  . Smoking status: Never Smoker   . Smokeless tobacco: Never Used  . Alcohol Use: 6.2 oz/week    4 Drinks containing 0.5 oz of alcohol, 7 Glasses of wine per week  . Drug Use: No  . Sexually Active: Not Currently   Other Topics Concern  . Not on file   Social History Narrative   [employer], [employer]. Never married. No children. Work - retired from [employer] after 34 years. Lives alone.   [facility] Life [facility] - interested in this. Provided a packet -Mar 19, 2012.    Current Outpatient Prescriptions on File Prior to Visit  Medication Sig Dispense Refill  . Ascorbic Acid (VITAMIN C) 1000 MG tablet Take 1,000 mg by mouth daily.        . Calcium Carbonate-Vitamin D (CALCIUM-VITAMIN D) 600-200 MG-UNIT CAPS Take 1 tablet by mouth daily.       . COD LIVER OIL PO Take by mouth daily.        [name] diltiazem (TIAZAC) 240 MG 24 hr capsule Take 1 capsule (240 mg total) by mouth daily.  30 capsule  11  . furosemide (LASIX) 40 MG tablet Take 1 tablet (40 mg total) by mouth daily as needed.  30 tablet  3  . gabapentin (NEURONTIN) 300 MG capsule Take 1 capsule (300 mg total) by mouth 3 (three) times daily. See instructions on After visit summary: will work up to 600 mg (2 tabs) tid.  90 capsule  3  .  lisinopril-hydrochlorothiazide (PRINZIDE,ZESTORETIC) 20-25 MG per tablet Take 1 tablet by mouth daily.  30 tablet  11  . potassium chloride SA (K-DUR,KLOR-CON) 20 MEQ tablet Take 1 tablet (20 mEq total) by mouth daily.  30 tablet  11  . simvastatin (ZOCOR) 40 MG tablet Take 1 tablet (40 mg total) by mouth at bedtime.  30 tablet  11   Current Facility-Administered Medications on File Prior to Visit  Medication Dose Route Frequency Provider Last Rate Last Dose  . pneumococcal 23 valent vaccine (PNU-IMMUNE) injection 0.5 mL  0.5 mL Intramuscular Once [name], MD        Allergies  Allergen Reactions  . Tegretol (Carbamazepine)     blisters    Family History  Problem Relation Age of Onset  . Coronary artery disease Mother   . Diabetes Mother   . Heart disease Mother     CHF  . Coronary artery disease Father   . Diabetes Father   . Heart disease Father   . Diabetes Sister   . Diabetes Brother   .  Cancer Neg Hx   . COPD Neg Hx   . Colon cancer Neg Hx   . Esophageal cancer Neg Hx   . Stomach cancer Neg Hx   . Rectal cancer Neg Hx   no goiter or other thyroid probs BP 130/72  Pulse 90  Wt 170 lb (77.111 kg)  BMI 27.03 kg/m2  SpO2 97%  Review of Systems denies fever, blurry vision, headache, chest pain, sob, n/v, urinary frequency, cramps, excessive diaphoresis, memory loss, depression, menopausal sxs, rhinorrhea, and easy bruising.      Objective:   Physical Exam VS: see vs page GEN: no distress HEAD: head: no deformity eyes: no periorbital swelling, no proptosis external nose and ears are normal mouth: no lesion seen NECK: i can feel what i think is a multinodular thyroid surface.  i also think the thyroid is elnarged. CHEST WALL: no deformity LUNGS:  Clear to auscultation CV: reg rate and rhythm, no murmur ABDOMEN: abdomen is soft, nontender.  no hepatosplenomegaly. not distended.  no hernia MUSCULOSKELETAL: muscle bulk and strength are grossly normal.  no  obvious joint swelling.  gait is normal and steady.   EXTEMITIES: no deformity.  Trace bilat leg edema PULSES: dorsalis pedis intact bilat.  no carotid bruit NEURO:  cn 2-12 grossly intact.   readily moves all 4's.  sensation is intact to touch on the feet.  Slight tremor of the hands SKIN:  Normal texture and temperature.  No rash or suspicious lesion is visible.  Not diaphoretic. NODES:  None palpable at the neck PSYCH: alert, oriented x3.  Does not appear anxious nor depressed.    Lab Results  Component Value Date   TSH 0.02* 03/11/2013   T4TOTAL 7.9 03/24/2009      Assessment & Plan:  Multinodular goiter is suggested by exam Hyperthyroidism, due to the goiter Osteoporosis can be exacerbated by the hyperthyroidism. trigeminal neuralgia.  This could be exac by hyperthyroidism.

## 2013-03-26 ENCOUNTER — Encounter: Payer: Medicare Other | Admitting: Internal Medicine

## 2013-03-26 DIAGNOSIS — Z1231 Encounter for screening mammogram for malignant neoplasm of breast: Secondary | ICD-10-CM | POA: Diagnosis not present

## 2013-03-31 ENCOUNTER — Encounter (HOSPITAL_COMMUNITY): Payer: Medicare Other

## 2013-04-01 ENCOUNTER — Encounter (HOSPITAL_COMMUNITY): Payer: Medicare Other

## 2013-04-02 DIAGNOSIS — H2000 Unspecified acute and subacute iridocyclitis: Secondary | ICD-10-CM | POA: Diagnosis not present

## 2013-04-03 ENCOUNTER — Other Ambulatory Visit: Payer: Self-pay | Admitting: Internal Medicine

## 2013-04-08 ENCOUNTER — Other Ambulatory Visit: Payer: Self-pay | Admitting: Internal Medicine

## 2013-04-15 ENCOUNTER — Encounter (HOSPITAL_COMMUNITY)
Admission: RE | Admit: 2013-04-15 | Discharge: 2013-04-15 | Disposition: A | Discharge status: Home / Self Care | Payer: Medicare Other | Source: Ambulatory Visit | Attending: Endocrinology | Admitting: Endocrinology

## 2013-04-15 DIAGNOSIS — E0590 Thyrotoxicosis, unspecified without thyrotoxic crisis or storm: Secondary | ICD-10-CM | POA: Insufficient documentation

## 2013-04-16 ENCOUNTER — Encounter (HOSPITAL_COMMUNITY)
Admission: RE | Admit: 2013-04-16 | Discharge: 2013-04-16 | Disposition: A | Discharge status: Home / Self Care | Payer: Medicare Other | Source: Ambulatory Visit | Attending: Endocrinology | Admitting: Endocrinology

## 2013-04-16 ENCOUNTER — Other Ambulatory Visit: Payer: Self-pay | Admitting: Endocrinology

## 2013-04-16 DIAGNOSIS — E0521 Thyrotoxicosis with toxic multinodular goiter with thyrotoxic crisis or storm: Secondary | ICD-10-CM | POA: Diagnosis not present

## 2013-04-16 DIAGNOSIS — E0590 Thyrotoxicosis, unspecified without thyrotoxic crisis or storm: Secondary | ICD-10-CM

## 2013-04-16 MED ORDER — SODIUM IODIDE I 131 CAPSULE
13.5000 | Freq: Once | INTRAVENOUS | Status: AC | PRN
  Administered 2013-04-16: 13.5 via ORAL

## 2013-04-16 MED ORDER — SODIUM PERTECHNETATE TC 99M INJECTION
10.1000 | Freq: Once | INTRAVENOUS | Status: AC | PRN
  Administered 2013-04-16: 10.1 via INTRAVENOUS

## 2013-04-20 DIAGNOSIS — H2000 Unspecified acute and subacute iridocyclitis: Secondary | ICD-10-CM | POA: Diagnosis not present

## 2013-05-04 DIAGNOSIS — H2000 Unspecified acute and subacute iridocyclitis: Secondary | ICD-10-CM | POA: Diagnosis not present

## 2013-05-08 DIAGNOSIS — H3090 Unspecified chorioretinal inflammation, unspecified eye: Secondary | ICD-10-CM | POA: Diagnosis not present

## 2013-05-08 DIAGNOSIS — B029 Zoster without complications: Secondary | ICD-10-CM | POA: Diagnosis not present

## 2013-05-08 DIAGNOSIS — H2010 Chronic iridocyclitis, unspecified eye: Secondary | ICD-10-CM | POA: Diagnosis not present

## 2013-05-10 DIAGNOSIS — H3090 Unspecified chorioretinal inflammation, unspecified eye: Secondary | ICD-10-CM | POA: Insufficient documentation

## 2013-05-10 DIAGNOSIS — H3020 Posterior cyclitis, unspecified eye: Secondary | ICD-10-CM | POA: Insufficient documentation

## 2013-05-12 ENCOUNTER — Ambulatory Visit (INDEPENDENT_AMBULATORY_CARE_PROVIDER_SITE_OTHER): Payer: Medicare Other | Admitting: Internal Medicine

## 2013-05-12 ENCOUNTER — Encounter: Payer: Self-pay | Admitting: Internal Medicine

## 2013-05-12 ENCOUNTER — Telehealth: Payer: Self-pay | Admitting: *Deleted

## 2013-05-12 VITALS — BP 150/72 | HR 79 | Temp 97.9°F | Resp 12 | Ht 66.5 in | Wt 173.8 lb

## 2013-05-12 DIAGNOSIS — I10 Essential (primary) hypertension: Secondary | ICD-10-CM

## 2013-05-12 DIAGNOSIS — E785 Hyperlipidemia, unspecified: Secondary | ICD-10-CM

## 2013-05-12 DIAGNOSIS — E0590 Thyrotoxicosis, unspecified without thyrotoxic crisis or storm: Secondary | ICD-10-CM | POA: Diagnosis not present

## 2013-05-12 DIAGNOSIS — Z0000 Encounter for general adult medical examination without abnormal findings: Secondary | ICD-10-CM

## 2013-05-12 MED ORDER — LISINOPRIL 20 MG PO TABS: 20.0000 mg | ORAL_TABLET | Freq: Every day | ORAL | Status: DC

## 2013-05-12 NOTE — Patient Instructions (Addendum)
Good to see you.  Thyroid - you have a goiter and had an overproducing nodule causing you to be hyperthyroid. This was caught early - before you had real symptoms. Now, after radioactive ablation we will need to monitor your thyroid function to be sure it does not get to low. Return for lab after July 15  Will get routine lab in July as well.  Blood pressure -  BP Readings from Last 3 Encounters:  05/12/13 150/72  03/18/13 130/72  03/11/13 180/76   We will be able to get better control by making a small change: lisinopril 20 mg daily, furosemide 40 mg once a day to help bring down the blood pressure,  And potassium daily.  We will check your cholesterol in July  Please check with [facility] about coverage for the shingles vaccine.   Keep up the good work with exercise.

## 2013-05-12 NOTE — Telephone Encounter (Signed)
Pt called states she spoke with her insurance company and they will not cover the Zoster.  Pt requests the amount of the injection prior to her appointment.  Please advise

## 2013-05-12 NOTE — Progress Notes (Signed)
Subjective:    Patient ID: Deborah Myers, female    DOB: [birth year], 77 y.o.   MRN: [id number]  HPI The patient is here for annual Medicare wellness examination and management of other chronic and acute problems.  She has made a good recovery from shingle CN V right scalp/face.  She was diagnosed with hyperthyroid disease by nuc scan and underwent ablation may 29th. She will be due for follow up lab in mid-July.  She has been doing well otherwise.    The risk factors are reflected in the social history.  The roster of all physicians providing medical care to patient - is listed in the Snapshot section of the chart.  Activities of daily living:  The patient is 100% inedpendent in all ADLs: dressing, toileting, feeding as well as independent mobility  Home safety : The patient has smoke detectors in the home. They wear seatbelts. No firearms at home. There is no violence in the home.   There is no risks for hepatitis, STDs or HIV. There is no history of blood transfusion. They have no travel history to infectious disease endemic areas of the world.  The patient has not seen their dentist in the last six month. They have seen their eye doctor in the last year. They deny any hearing difficulty and have not had audiologic testing in the last year.    They do not  have excessive sun exposure. Discussed the need for sun protection: hats, long sleeves and use of sunscreen if there is significant sun exposure.   Diet: the importance of a healthy diet is discussed. They do have a healthy diet.  The patient has a regular exercise program: water aerobics , 40 min duration,  3 per week.  The benefits of regular aerobic exercise were discussed.  Depression screen: there are no signs or vegative symptoms of depression- irritability, change in appetite, anhedonia, sadness/tearfullness.  Cognitive assessment: the patient manages all their financial and personal affairs and is actively  engaged.  The following portions of the patient's history were reviewed and updated as appropriate: allergies, current medications, past family history, past medical history,  past surgical history, past social history  and problem list.  Vision, hearing, body mass index were assessed and reviewed.   During the course of the visit the patient was educated and counseled about appropriate screening and preventive services including : fall prevention , diabetes screening, nutrition counseling, colorectal cancer screening, and recommended immunizations.  Past Medical History  Diagnosis Date  . Hyperlipidemia   . HTN (hypertension)   . Cecum mass 2008    Benign   . Arthritis   . Cataract   . Osteoporosis     no per pt   Past Surgical History  Procedure Laterality Date  . Colectomy  2008    Benign mass - laparoscopic right  . Colonoscopy     Family History  Problem Relation Age of Onset  . Coronary artery disease Mother   . Diabetes Mother   . Heart disease Mother     CHF  . Coronary artery disease Father   . Diabetes Father   . Heart disease Father   . Diabetes Sister   . Diabetes Brother   . Cancer Neg Hx   . COPD Neg Hx   . Colon cancer Neg Hx   . Esophageal cancer Neg Hx   . Stomach cancer Neg Hx   . Rectal cancer Neg Hx    History   Social  History  . Marital Status: Single    Spouse Name: N/A    Number of Children: N/A  . Years of Education: 16   Occupational History  . RETIRED [employer]   Social History Main Topics  . Smoking status: Never Smoker   . Smokeless tobacco: Never Used  . Alcohol Use: 6.2 oz/week    4 Drinks containing 0.5 oz of alcohol, 7 Glasses of wine per week  . Drug Use: No  . Sexually Active: Not Currently   Other Topics Concern  . Not on file   Social History Narrative   HSG, [employer]. Never married. No children. Work - retired from [employer] after 34 years. Lives alone.   End of Life Care - interested in this. Provided a packet -Mar 19, 2012.    Current Outpatient Prescriptions on File Prior to Visit  Medication Sig Dispense Refill  . Ascorbic Acid (VITAMIN C) 1000 MG tablet Take 1,000 mg by mouth daily.        . Calcium Carbonate-Vitamin D (CALCIUM-VITAMIN D) 600-200 MG-UNIT CAPS Take 1 tablet by mouth daily.       . COD LIVER OIL PO Take by mouth daily.        [name] diltiazem (TIAZAC) 240 MG 24 hr capsule take 1 capsule by mouth once daily  30 capsule  5  . furosemide (LASIX) 40 MG tablet Take 1 tablet (40 mg total) by mouth daily as needed.  30 tablet  3  . gabapentin (NEURONTIN) 300 MG capsule Take 1 capsule (300 mg total) by mouth 3 (three) times daily. See instructions on After visit summary: will work up to 600 mg (2 tabs) tid.  90 capsule  3  . potassium chloride SA (K-DUR,KLOR-CON) 20 MEQ tablet Take 1 tablet (20 mEq total) by mouth daily.  30 tablet  11  . simvastatin (ZOCOR) 40 MG tablet Take 1 tablet (40 mg total) by mouth at bedtime.  30 tablet  11   Current Facility-Administered Medications on File Prior to Visit  Medication Dose Route Frequency Provider Last Rate Last Dose  . pneumococcal 23 valent vaccine (PNU-IMMUNE) injection 0.5 mL  0.5 mL Intramuscular Once [name], MD          Review of Systems System review is negative for any constitutional, cardiac, pulmonary, GI or neuro symptoms or complaints other than as described in the HPI. \    Objective:   Physical Exam Filed Vitals:   05/12/13 1006  BP: 150/72  Pulse: 79  Temp: 97.9 F (36.6 C)  Resp: 12   Wt Readings from Last 3 Encounters:  05/12/13 173 lb 12.8 oz (78.835 kg)  03/18/13 170 lb (77.111 kg)  03/11/13 176 lb 6.4 oz (80.015 kg)   Gen'l: well nourished, well developed     Woman in no distress HEENT - [place]/AT, EACs/TMs normal, oropharynx with native dentition in good condition, no buccal or palatal lesions, posterior pharynx clear, mucous membranes moist. C&S clear, PERRLA, fundi - normal Neck - supple, no thyromegaly Nodes-  negative submental, cervical, supraclavicular regions Chest - no deformity, no CVAT Lungs - cleat without rales, wheezes. No increased work of breathing Breast - Cardiovascular - regular rate and rhythm, quiet precordium, no murmurs, rubs or gallops, 2+ radial, DP and PT pulses Abdomen - BS+ x 4, no HSM, no guarding or rebound or tenderness Pelvic - deferred to gyn Rectal - deferred to gyn Extremities - no clubbing, cyanosis, edema or deformity.  Neuro - A&O  x 3, CN II-XII normal, motor strength normal and equal, DTRs 2+ and symmetrical biceps, radial, and patellar tendons. Cerebellar - no tremor, no rigidity, fluid movement and normal gait. Derm - Head, neck, back, abdomen and extremities without suspicious lesions  Recent Results (from the past 2160 hour(s))  TSH     Status: Abnormal   Collection Time    03/11/13 10:45 AM      Result Value Range   TSH 0.02 (*) 0.35 - 5.50 uIU/mL  BASIC METABOLIC PANEL     Status: Abnormal   Collection Time    03/11/13 10:45 AM      Result Value Range   Sodium 141  135 - 145 mEq/L   Potassium 3.2 (*) 3.5 - 5.1 mEq/L   Chloride 105  96 - 112 mEq/L   CO2 30  19 - 32 mEq/L   Glucose, Bld 76  70 - 99 mg/dL   BUN 19  6 - 23 mg/dL   Creatinine, Ser 0.8  0.4 - 1.2 mg/dL   Calcium 9.7  8.4 - 16.1 mg/dL   GFR 09.60  >45.40 mL/min          Assessment & Plan:

## 2013-05-12 NOTE — Telephone Encounter (Signed)
Pt called states she spoke with her insurance company and they will not cover the Zoster.  Pt is requesting the price of the injection prior to her appointment.  Please advise.

## 2013-05-13 NOTE — Telephone Encounter (Signed)
Best cost alternative will be to go to the [facility] department

## 2013-05-13 NOTE — Telephone Encounter (Signed)
Advised as per Dr Debby Bud

## 2013-05-15 NOTE — Telephone Encounter (Signed)
A user error has taken place.

## 2013-05-17 NOTE — Assessment & Plan Note (Signed)
Lab Results  Component Value Date   TSH 0.02* 03/11/2013   Repeat lab: TSH, FT4, T3 ordered and pending: recommendations to follow.

## 2013-05-17 NOTE — Assessment & Plan Note (Signed)
BP Readings from Last 3 Encounters:  05/12/13 150/72  03/18/13 130/72  03/11/13 180/76   Borderline control. Labs ordered.   Plan To return for BP follow up.

## 2013-05-17 NOTE — Assessment & Plan Note (Signed)
Previous lab with good control. Repeat lipid panel and LFTs ordered and pending.

## 2013-05-17 NOTE — Assessment & Plan Note (Signed)
Interval history: she has recovered from CN V zoster outbreak; she has had ablation procedure for hyperthyroid disease and is due for all her labs in mid-July. She is current with colorectal and breast cancer screening. Her immunizations are up to date and she should have shingles vaccine in several months, after full recovery.  In summary - a very nice woman who does appear medically stable. She will need to return for lab work and for blood pressure check.

## 2013-05-18 ENCOUNTER — Other Ambulatory Visit: Payer: Self-pay | Admitting: Internal Medicine

## 2013-06-04 ENCOUNTER — Other Ambulatory Visit (INDEPENDENT_AMBULATORY_CARE_PROVIDER_SITE_OTHER): Payer: Medicare Other

## 2013-06-04 DIAGNOSIS — E785 Hyperlipidemia, unspecified: Secondary | ICD-10-CM

## 2013-06-04 DIAGNOSIS — E0590 Thyrotoxicosis, unspecified without thyrotoxic crisis or storm: Secondary | ICD-10-CM | POA: Diagnosis not present

## 2013-06-04 DIAGNOSIS — I10 Essential (primary) hypertension: Secondary | ICD-10-CM

## 2013-06-04 LAB — LIPID PANEL
LDL Cholesterol: 77 mg/dL (ref 0–99)
Total CHOL/HDL Ratio: 3
VLDL: 5.6 mg/dL (ref 0.0–40.0)

## 2013-06-04 LAB — T3, FREE: T3, Free: 3.3 pg/mL (ref 2.3–4.2)

## 2013-06-04 LAB — HEPATIC FUNCTION PANEL
Albumin: 3.5 g/dL (ref 3.5–5.2)
Bilirubin, Direct: 0.1 mg/dL (ref 0.0–0.3)
Total Protein: 7.2 g/dL (ref 6.0–8.3)

## 2013-06-04 LAB — COMPREHENSIVE METABOLIC PANEL
ALT: 12 U/L (ref 0–35)
AST: 15 U/L (ref 0–37)
Alkaline Phosphatase: 57 U/L (ref 39–117)
CO2: 28 mEq/L (ref 19–32)
GFR: 91.17 mL/min (ref >60.00)
Sodium: 142 mEq/L (ref 135–145)
Total Bilirubin: 0.5 mg/dL (ref 0.3–1.2)
Total Protein: 7.2 g/dL (ref 6.0–8.3)

## 2013-06-04 LAB — T4, FREE: Free T4: 1.29 ng/dL (ref 0.60–1.60)

## 2013-06-06 ENCOUNTER — Encounter: Payer: Self-pay | Admitting: Internal Medicine

## 2013-06-11 ENCOUNTER — Ambulatory Visit (INDEPENDENT_AMBULATORY_CARE_PROVIDER_SITE_OTHER): Payer: Medicare Other

## 2013-06-11 DIAGNOSIS — Z23 Encounter for immunization: Secondary | ICD-10-CM

## 2013-06-11 DIAGNOSIS — Z2911 Encounter for prophylactic immunotherapy for respiratory syncytial virus (RSV): Secondary | ICD-10-CM

## 2013-10-26 ENCOUNTER — Other Ambulatory Visit: Payer: Self-pay

## 2013-10-26 MED ORDER — DILTIAZEM HCL ER BEADS 240 MG PO CP24: 240.0000 mg | ORAL_CAPSULE | Freq: Every day | ORAL | Status: DC

## 2013-10-28 ENCOUNTER — Telehealth: Payer: Self-pay | Admitting: *Deleted

## 2013-10-28 NOTE — Telephone Encounter (Signed)
Stacy from [facility] called states no authorization needed for Diltiazem Rx.

## 2013-12-07 DIAGNOSIS — IMO0002 Reserved for concepts with insufficient information to code with codable children: Secondary | ICD-10-CM | POA: Diagnosis not present

## 2013-12-07 DIAGNOSIS — H2510 Age-related nuclear cataract, unspecified eye: Secondary | ICD-10-CM | POA: Diagnosis not present

## 2013-12-16 DIAGNOSIS — IMO0002 Reserved for concepts with insufficient information to code with codable children: Secondary | ICD-10-CM | POA: Diagnosis not present

## 2013-12-16 DIAGNOSIS — H2510 Age-related nuclear cataract, unspecified eye: Secondary | ICD-10-CM | POA: Diagnosis not present

## 2013-12-17 ENCOUNTER — Telehealth: Payer: Self-pay | Admitting: Internal Medicine

## 2013-12-17 NOTE — Telephone Encounter (Signed)
I have no preferential recommendation. Whichever of our team is next up in rotation should be just fine.

## 2013-12-17 NOTE — Telephone Encounter (Signed)
Deborah Myers would like Dr. Linda Hedges advise as to which provider to choose when he retires.

## 2013-12-18 NOTE — Telephone Encounter (Signed)
Pt will think about who she would like to switch to. She has an appt with Dr. Linda Hedges on Feb. 16.

## 2013-12-23 DIAGNOSIS — IMO0002 Reserved for concepts with insufficient information to code with codable children: Secondary | ICD-10-CM | POA: Diagnosis not present

## 2013-12-23 DIAGNOSIS — H2510 Age-related nuclear cataract, unspecified eye: Secondary | ICD-10-CM | POA: Diagnosis not present

## 2014-01-04 ENCOUNTER — Encounter: Payer: Self-pay | Admitting: Internal Medicine

## 2014-01-04 ENCOUNTER — Ambulatory Visit (INDEPENDENT_AMBULATORY_CARE_PROVIDER_SITE_OTHER): Payer: Medicare Other | Admitting: Internal Medicine

## 2014-01-04 VITALS — BP 180/98 | HR 70 | Temp 98.4°F | Wt 170.6 lb

## 2014-01-04 DIAGNOSIS — M25569 Pain in unspecified knee: Secondary | ICD-10-CM | POA: Insufficient documentation

## 2014-01-04 DIAGNOSIS — E0590 Thyrotoxicosis, unspecified without thyrotoxic crisis or storm: Secondary | ICD-10-CM

## 2014-01-04 DIAGNOSIS — I10 Essential (primary) hypertension: Secondary | ICD-10-CM | POA: Diagnosis not present

## 2014-01-04 DIAGNOSIS — Z0000 Encounter for general adult medical examination without abnormal findings: Secondary | ICD-10-CM

## 2014-01-04 DIAGNOSIS — R609 Edema, unspecified: Secondary | ICD-10-CM

## 2014-01-04 DIAGNOSIS — Z23 Encounter for immunization: Secondary | ICD-10-CM | POA: Diagnosis not present

## 2014-01-04 DIAGNOSIS — Z9849 Cataract extraction status, unspecified eye: Secondary | ICD-10-CM

## 2014-01-04 DIAGNOSIS — B0229 Other postherpetic nervous system involvement: Secondary | ICD-10-CM

## 2014-01-04 NOTE — Patient Instructions (Addendum)
It has been a pleasure providing care for you for all of these years  1) Blood Pressure - Continue current medications - Check blood pressure outside of the clinic. - If the weather is okay, check blood pressure a couple of times this week and leave me a message. - Take the furosemide (Lasix) every day instead of only as needed. - Consider investing in a blood pressure cuff for your arm (best) or wrist (okay).  2) Leg Pain - Re-start water aerobics beginning in March.  - Exercise as tolerated - we will see if that helps your pain. - We will continue to monitor.  3) Swelling - Continue to take Lasix (furosemide) for swelling - Continue to use compression stockings when standing for a long time  4) Routine Health Maintenance - You received the Prevnar pneumonia vaccine today. - We recommend 150 minutes of exercise a week, as you can tolerate.  5) Gabapentin - Stop this medicine. It was prescribed for pain in your face after shingles. - If you feel this pain again, may restart the medicine at 300mg  once a day.   6) Retirement - I am retiring at the end of March.  - At that time, you can see Dr. Alain Marion

## 2014-01-04 NOTE — Progress Notes (Signed)
   Subjective:    Patient ID: Deborah Myers, female    DOB: [birth year], 78 y.o.   MRN: [id number]  HPI    Review of Systems     Objective:   Physical Exam        Assessment & Plan:

## 2014-01-04 NOTE — Assessment & Plan Note (Signed)
This is a pleasant 78 year old woman who presents for routine health maintenance. Interval history is remarkable for bilateral cateract removal in January / February '14. Patient tolerated the procedure well and reports no complications. Che Not remarkable for major illness or hospitalization. She received the Prevnar vaccine today.   In summary this is a 78 year old woman with multiple chronic medical problems in no acute distress. Prevnar vaccine today, follow up in one year or as needed.

## 2014-01-04 NOTE — Assessment & Plan Note (Signed)
BP Readings from Last 3 Encounters:  01/04/14 180/98  05/12/13 150/72  03/18/13 130/72   Patient reports a headache this morning. Takes diuretic only as needed and has only taken one of her other meds, but cannot remember which one.  Plan Continue current medications. Check blood pressure outside clinic 1-2/wk.

## 2014-01-04 NOTE — Progress Notes (Signed)
Subjective:     Patient ID: Deborah Myers, female   DOB: 09-Apr-1936, 78 y.o.   MRN: [id number]  HPI  Patient presents today with right leg pain. Has been hurting off an on since last year. Feels like she needs to stretch it out. Dull pain, 9/10 at its worst, not constant. Usually does water aerobics, but has not been for the last eight months. Hoping to go back at the last part of March, and things that will help her leg. Denies back pain.   Patient reports getting up to use the bathroom 3-5 times per night. Does not want to start a medication for it.   Only takes 300mg  gabapentin QD, prescribet 300TID -> 600TID.  Pt reports a headache this morning. No vision changes. Reports a mild, intermittent tremor in left hand this morning.   Past Medical History  Diagnosis Date  . Hyperlipidemia   . HTN (hypertension)   . Cecum mass 2008    Benign   . Arthritis   . Cataract   . Osteoporosis     no per pt   Past Surgical History  Procedure Laterality Date  . Colectomy  2008    Benign mass - laparoscopic right  . Colonoscopy     Family History  Problem Relation Age of Onset  . Coronary artery disease Mother   . Diabetes Mother   . Heart disease Mother     CHF  . Coronary artery disease Father   . Diabetes Father   . Heart disease Father   . Diabetes Sister   . Diabetes Brother   . Cancer Neg Hx   . COPD Neg Hx   . Colon cancer Neg Hx   . Esophageal cancer Neg Hx   . Stomach cancer Neg Hx   . Rectal cancer Neg Hx    History   Social History  . Marital Status: Single    Spouse Name: N/A    Number of Children: N/A  . Years of Education: 16   Occupational History  . RETIRED [employer]   Social History Main Topics  . Smoking status: Never Smoker   . Smokeless tobacco: Never Used  . Alcohol Use: 6.2 oz/week    4 Drinks containing 0.5 oz of alcohol, 7 Glasses of wine per week  . Drug Use: No  . Sexual Activity: Not Currently   Other Topics Concern  . Not on file    Social History Narrative   HSG, [employer]. Never married. No children. Work - retired from [employer] after 34 years. Lives alone.   End of Life Care - interested in this. Provided a packet -Mar 19, 2012.    Current Outpatient Prescriptions on File Prior to Visit  Medication Sig Dispense Refill  . Ascorbic Acid (VITAMIN C) 1000 MG tablet Take 1,000 mg by mouth daily.        . Calcium Carbonate-Vitamin D (CALCIUM-VITAMIN D) 600-200 MG-UNIT CAPS Take 1 tablet by mouth daily.       . COD LIVER OIL PO Take by mouth daily.        [name] diltiazem (TIAZAC) 240 MG 24 hr capsule Take 1 capsule (240 mg total) by mouth daily.  30 capsule  5  . furosemide (LASIX) 40 MG tablet Take 1 tablet (40 mg total) by mouth daily as needed.  30 tablet  3  . gabapentin (NEURONTIN) 300 MG capsule Take 1 capsule (300 mg total) by mouth 3 (three) times daily. See instructions on After  visit summary: will work up to 600 mg (2 tabs) tid.  90 capsule  3  . lisinopril (PRINIVIL,ZESTRIL) 20 MG tablet Take 1 tablet (20 mg total) by mouth daily.  30 tablet  11  . potassium chloride SA (K-DUR,KLOR-CON) 20 MEQ tablet Take 1 tablet (20 mEq total) by mouth daily.  30 tablet  11  . simvastatin (ZOCOR) 40 MG tablet take 1 tablet by mouth every AT BEDTIME  30 tablet  11   Current Facility-Administered Medications on File Prior to Visit  Medication Dose Route Frequency Provider Last Rate Last Dose  . pneumococcal 23 valent vaccine (PNU-IMMUNE) injection 0.5 mL  0.5 mL Intramuscular Once Neena Rhymes, MD         Review of Systems System review is negative for any constitutional, cardiac, pulmonary, GI or neuro symptoms or complaints other than as described in the HPI.     Objective:   Physical Exam Filed Vitals:   01/04/14 0934  BP: 180/98  Pulse: 70  Temp: 98.4 F (36.9 C)   BP Readings from Last 3 Encounters:  01/04/14 180/98  05/12/13 150/72  03/18/13 130/72    Filed Weights   01/04/14 0934  Weight: 170 lb 9.6 oz  (77.384 kg)  Body mass index is 27.13 kg/(m^2).  General: Well developed, well nourished, NAD, appears stated age HEENT: NCAT, PERRLA, EOMI, Anicteic Sclera, mucous membranes moist.  Neck: Supple, no JVD, no masses  Cardiovascular: S1 S2 auscultated, no rubs, murmurs or gallops. Regular rate and rhythm. 2+ radial, DP and PT pulses. Respiratory: Clear to auscultation bilaterally with equal chest rise  Abdomen: Soft, nontender, nondistended, + bowel sounds  Extremities: warm, dry without cyanosis, clubbing, or edema  Neuro: Alert and Oriented x3, cranial nerves II-XII grossly intact. Finger-nose normal bilaterally without tremor. DTRs normal and equal bilaterally. MSK: 5/5 strength in biceps, triceps, deltoids, hip extensors / flexors, knee flexors /extensors bilaterally. No pain with flexion, extension, internal /external rotation of the knee.  Skin: Without rashes, exudates, or nodules  Psych: Normal mood and affect with intact judgement and insight    Imaging and interpretation of thyroid scan from 04/16/13 reviewed: IMPRESSION:  24-hour radioiodine uptake of 28.2%.  Multinodular appearing thyroid gland with a mildly hyper functional  nodule in the mid left thyroid lobe.      Assessment:     See problem list     Plan:     See problem list

## 2014-01-04 NOTE — Assessment & Plan Note (Signed)
Patient denies any facial pain.  Plan D/c gabapentin

## 2014-01-04 NOTE — Assessment & Plan Note (Signed)
Well controlled on current medications. Takes Lasix PRN for swelling. Wears compression stockings prophylactically.

## 2014-01-04 NOTE — Assessment & Plan Note (Signed)
Patient reports a significant improvement in vision after the surgery.   Plan: continue prescription eye drops per MD. Follow up with opthalmology on Monday Feb 23rd.

## 2014-01-04 NOTE — Progress Notes (Signed)
Pre visit review using our clinic review tool, if applicable. No additional management support is needed unless otherwise documented below in the visit note. 

## 2014-01-04 NOTE — Assessment & Plan Note (Addendum)
Patient reports right leg pain for the last year. Up to 9/10 severity, but describes pain as dull and intermittent. No reproducible pain on exam, and patient reports that she has not had pain today. She has not frequented her water aerobics class, and has taken 300mg  gabapentin QD instead of the 300TID -> 600TID prescribed.  Plan: Resume water aerobics regimen. RTO if not relief.

## 2014-01-04 NOTE — Assessment & Plan Note (Addendum)
  Lab Results  Component Value Date   TSH 0.05* 06/04/2013   Patient notes a fine tremor, but no other symptoms.

## 2014-03-29 DIAGNOSIS — Z1231 Encounter for screening mammogram for malignant neoplasm of breast: Secondary | ICD-10-CM | POA: Diagnosis not present

## 2014-04-01 ENCOUNTER — Other Ambulatory Visit: Payer: Self-pay | Admitting: Internal Medicine

## 2014-04-01 DIAGNOSIS — N6009 Solitary cyst of unspecified breast: Secondary | ICD-10-CM | POA: Diagnosis not present

## 2014-04-16 ENCOUNTER — Encounter: Payer: Self-pay | Admitting: Internal Medicine

## 2014-05-03 DIAGNOSIS — Z961 Presence of intraocular lens: Secondary | ICD-10-CM | POA: Diagnosis not present

## 2014-05-07 ENCOUNTER — Telehealth: Payer: Self-pay | Admitting: Internal Medicine

## 2014-05-07 MED ORDER — DILTIAZEM HCL ER BEADS 240 MG PO CP24: 240.0000 mg | ORAL_CAPSULE | Freq: Every day | ORAL | Status: DC

## 2014-05-07 NOTE — Telephone Encounter (Signed)
Pt needs a refill on Tiazac 240 mg.  She uses [facility] on [address].  She is a Dr . Linda Hedges pt and she is scheduled with Dr. Doug Sou in Sept.  She has one pill left.

## 2014-05-28 ENCOUNTER — Telehealth: Payer: Self-pay | Admitting: *Deleted

## 2014-05-28 MED ORDER — LISINOPRIL 20 MG PO TABS: 20.0000 mg | ORAL_TABLET | Freq: Every day | ORAL | Status: DC

## 2014-05-28 NOTE — Telephone Encounter (Signed)
Refill done.  

## 2014-06-22 DIAGNOSIS — B029 Zoster without complications: Secondary | ICD-10-CM | POA: Diagnosis not present

## 2014-06-22 DIAGNOSIS — H2010 Chronic iridocyclitis, unspecified eye: Secondary | ICD-10-CM | POA: Diagnosis not present

## 2014-06-22 DIAGNOSIS — H3090 Unspecified chorioretinal inflammation, unspecified eye: Secondary | ICD-10-CM | POA: Diagnosis not present

## 2014-06-28 ENCOUNTER — Other Ambulatory Visit: Payer: Self-pay

## 2014-06-28 MED ORDER — POTASSIUM CHLORIDE CRYS ER 20 MEQ PO TBCR: EXTENDED_RELEASE_TABLET | ORAL | Status: DC

## 2014-07-09 ENCOUNTER — Other Ambulatory Visit: Payer: Self-pay

## 2014-07-09 MED ORDER — SIMVASTATIN 40 MG PO TABS: ORAL_TABLET | ORAL | Status: DC

## 2014-08-09 ENCOUNTER — Other Ambulatory Visit (INDEPENDENT_AMBULATORY_CARE_PROVIDER_SITE_OTHER): Payer: Medicare Other

## 2014-08-09 ENCOUNTER — Ambulatory Visit (INDEPENDENT_AMBULATORY_CARE_PROVIDER_SITE_OTHER): Payer: Medicare Other | Admitting: Internal Medicine

## 2014-08-09 ENCOUNTER — Encounter: Payer: Self-pay | Admitting: Internal Medicine

## 2014-08-09 VITALS — BP 162/72 | HR 70 | Temp 97.9°F | Resp 20 | Ht 66.5 in | Wt 174.0 lb

## 2014-08-09 DIAGNOSIS — M25561 Pain in right knee: Secondary | ICD-10-CM

## 2014-08-09 DIAGNOSIS — E0590 Thyrotoxicosis, unspecified without thyrotoxic crisis or storm: Secondary | ICD-10-CM

## 2014-08-09 DIAGNOSIS — M810 Age-related osteoporosis without current pathological fracture: Secondary | ICD-10-CM

## 2014-08-09 DIAGNOSIS — B0229 Other postherpetic nervous system involvement: Secondary | ICD-10-CM

## 2014-08-09 DIAGNOSIS — M25569 Pain in unspecified knee: Secondary | ICD-10-CM

## 2014-08-09 DIAGNOSIS — I10 Essential (primary) hypertension: Secondary | ICD-10-CM | POA: Diagnosis not present

## 2014-08-09 DIAGNOSIS — Z0000 Encounter for general adult medical examination without abnormal findings: Secondary | ICD-10-CM

## 2014-08-09 DIAGNOSIS — E785 Hyperlipidemia, unspecified: Secondary | ICD-10-CM

## 2014-08-09 LAB — LIPID PANEL
CHOLESTEROL: 140 mg/dL (ref 0–200)
HDL: 45.7 mg/dL (ref >39.00)
LDL Cholesterol: 84 mg/dL (ref 0–99)
NonHDL: 94.3
Total CHOL/HDL Ratio: 3
Triglycerides: 52 mg/dL (ref 0.0–149.0)
VLDL: 10.4 mg/dL (ref 0.0–40.0)

## 2014-08-09 LAB — BASIC METABOLIC PANEL
BUN: 17 mg/dL (ref 6–23)
CO2: 27 mEq/L (ref 19–32)
CREATININE: 0.7 mg/dL (ref 0.4–1.2)
Calcium: 9.8 mg/dL (ref 8.4–10.5)
Chloride: 108 mEq/L (ref 96–112)
GFR: 109.91 mL/min (ref >60.00)
Glucose, Bld: 82 mg/dL (ref 70–99)
Potassium: 3.8 mEq/L (ref 3.5–5.1)
Sodium: 143 mEq/L (ref 135–145)

## 2014-08-09 LAB — TSH: TSH: 0.02 u[IU]/mL — ABNORMAL LOW (ref 0.35–4.50)

## 2014-08-09 LAB — T4, FREE: FREE T4: 1.59 ng/dL (ref 0.60–1.60)

## 2014-08-09 NOTE — Patient Instructions (Signed)
We will have you STOP taking furosemide (lasix) and STOP taking potassium.   We will send in a higher dose of the medicine lisinopril. When you get your new prescription it will be for the higher dose. Until you run out you can take 2 pills of the lisinopril you have at home.   Once you start taking the new dose of the blood pressure medicine come back in about 1 month to check on your blood work and to have the nurse check your blood pressure.  Work on starting to exercise again as this will help with your knee.  Come back in about 6 months to check on your blood pressure.  Call our office sooner if you have problems or questions.  Exercise to Stay Healthy Exercise helps you become and stay healthy. EXERCISE IDEAS AND TIPS Choose exercises that:  You enjoy.  Fit into your day. You do not need to exercise really hard to be healthy. You can do exercises at a slow or medium level and stay healthy. You can:  Stretch before and after working out.  Try yoga, Pilates, or tai chi.  Lift weights.  Walk fast, swim, jog, run, climb stairs, bicycle, dance, or rollerskate.  Take aerobic classes. Exercises that burn about 150 calories:  Running 1  miles in 15 minutes.  Playing volleyball for 45 to 60 minutes.  Washing and waxing a car for 45 to 60 minutes.  Playing touch football for 45 minutes.  Walking 1  miles in 35 minutes.  Pushing a stroller 1  miles in 30 minutes.  Playing basketball for 30 minutes.  Raking leaves for 30 minutes.  Bicycling 5 miles in 30 minutes.  Walking 2 miles in 30 minutes.  Dancing for 30 minutes.  Shoveling snow for 15 minutes.  Swimming laps for 20 minutes.  Walking up stairs for 15 minutes.  Bicycling 4 miles in 15 minutes.  Gardening for 30 to 45 minutes.  Jumping rope for 15 minutes.  Washing windows or floors for 45 to 60 minutes. Document Released: 12/08/2010 Document Revised: 01/28/2012 Document Reviewed:  12/08/2010 [facility] Patient Information 2015 [place], [place]. This information is not intended to replace advice given to you by your health care provider. Make sure you discuss any questions you have with your health care provider.

## 2014-08-09 NOTE — Progress Notes (Signed)
Pre visit review using our clinic review tool, if applicable. No additional management support is needed unless otherwise documented below in the visit note. 

## 2014-08-09 NOTE — Progress Notes (Signed)
   Subjective:    Patient ID: Deborah Myers, female    DOB: 1936/04/14, 78 y.o.   MRN: [id number]  HPI The patient is a 78 YO female who is coming in today to establish care. She has PMH of HTN, hyperlipidemia, thyroid problems, hyperlipidemia. Her post herpetic neuralgia is gone and she is no longer needing gabapentin. She is retired and lives alone. She denies any falls, hearing change, vision change. She denies chest pains, SOB, constipation, diarrhea. She is having some pain in her knee which she thinks is from the weather and some mild swelling in her ankle. She does have a BP cup at home and checks it usually she thinks it is in the 140s at home.  Review of Systems  Constitutional: Negative for activity change, appetite change and fatigue.  HENT: Negative.   Eyes: Negative.   Respiratory: Negative for cough, chest tightness, shortness of breath and wheezing.   Cardiovascular: Negative for chest pain, palpitations and leg swelling.  Gastrointestinal: Negative for abdominal pain, diarrhea, constipation and abdominal distention.  Musculoskeletal: Positive for arthralgias. Negative for gait problem.  Skin: Negative.   Neurological: Negative for dizziness, weakness and light-headedness.      Objective:   Physical Exam  Constitutional: She is oriented to person, place, and time. She appears well-developed and well-nourished.  HENT:  Head: Normocephalic and atraumatic.  Eyes: EOM are normal.  Neck: Normal range of motion.  Cardiovascular: Normal rate and regular rhythm.   Pulmonary/Chest: Effort normal and breath sounds normal. No respiratory distress. She has no wheezes. She has no rales.  Abdominal: Soft. Bowel sounds are normal. She exhibits no distension. There is no tenderness.  Musculoskeletal: Normal range of motion.  Minimal edema in the right ankle, non-pitting  Neurological: She is alert and oriented to person, place, and time.  Skin: Skin is warm and dry.   Filed  Vitals:   08/09/14 1255 08/09/14 1327  BP: 170/92 162/72  Pulse: 70   Temp: 97.9 F (36.6 C)   TempSrc: Oral   Resp: 20   Height: 5' 6.5" (1.689 m)   Weight: 174 lb (78.926 kg)   SpO2: 97%       Assessment & Plan:

## 2014-08-09 NOTE — Assessment & Plan Note (Signed)
Searched through old medical records and unable to find DEXA

## 2014-08-10 MED ORDER — LISINOPRIL 40 MG PO TABS: 40.0000 mg | ORAL_TABLET | Freq: Every day | ORAL | Status: DC

## 2014-08-10 NOTE — Assessment & Plan Note (Signed)
Doing well off gabapentin at this time.

## 2014-08-10 NOTE — Assessment & Plan Note (Signed)
Patient declines shots today as she feels the shingles shot gave her shingles.

## 2014-08-10 NOTE — Assessment & Plan Note (Signed)
Check lipid panel today 

## 2014-08-10 NOTE — Assessment & Plan Note (Signed)
No symptoms at this time will check TSH and free T4. No obvious goiter on exam.

## 2014-08-10 NOTE — Assessment & Plan Note (Signed)
Patient states that this happens when the weather changes and advised if she needs medication to use tylenol and to work on increasing her exercise again as she has stopped.

## 2014-08-10 NOTE — Assessment & Plan Note (Signed)
As she does not know how to use the lasix (ordered daily as needed) will stop. Will increase lisinopril to 40 mg daily and continue diltiazem. Check BMP today and have her return in about 3-4 weeks after new dose of ACE-I for repeat BP and BMP.  BMET    Component Value Date/Time   NA 143 08/09/2014 1402   K 3.8 08/09/2014 1402   CL 108 08/09/2014 1402   CO2 27 08/09/2014 1402   GLUCOSE 82 08/09/2014 1402   BUN 17 08/09/2014 1402   CREATININE 0.7 08/09/2014 1402   CALCIUM 9.8 08/09/2014 1402   GFRNONAA 89.35 04/04/2010 1016   GFRAA 80 02/04/2008 1044

## 2014-11-22 ENCOUNTER — Telehealth: Payer: Self-pay | Admitting: Internal Medicine

## 2014-11-22 ENCOUNTER — Other Ambulatory Visit: Payer: Self-pay

## 2014-11-22 MED ORDER — DILTIAZEM HCL ER BEADS 240 MG PO CP24: 240.0000 mg | ORAL_CAPSULE | Freq: Every day | ORAL | Status: DC

## 2014-11-22 NOTE — Telephone Encounter (Signed)
Per chart refill has already been sent by Colletta [place] to [facility]...Johny Chess

## 2014-11-22 NOTE — Telephone Encounter (Signed)
Pt came by office requesting Rx refill for TAZTIA XT capsules. Pt is almost completely out of this medication and would like to be called when request is completed.

## 2014-12-08 ENCOUNTER — Ambulatory Visit (INDEPENDENT_AMBULATORY_CARE_PROVIDER_SITE_OTHER): Payer: Medicare Other | Admitting: Internal Medicine

## 2014-12-08 ENCOUNTER — Encounter: Payer: Self-pay | Admitting: Internal Medicine

## 2014-12-08 VITALS — BP 150/90 | HR 90 | Wt 170.0 lb

## 2014-12-08 DIAGNOSIS — I10 Essential (primary) hypertension: Secondary | ICD-10-CM

## 2014-12-08 DIAGNOSIS — R609 Edema, unspecified: Secondary | ICD-10-CM

## 2014-12-08 MED ORDER — CARVEDILOL 25 MG PO TABS: 25.0000 mg | ORAL_TABLET | Freq: Two times a day (BID) | ORAL | Status: DC

## 2014-12-08 NOTE — Progress Notes (Signed)
  Subjective:    HPI  C/o R LE ankle NT swelling over past 2 mo off and on.   Review of Systems  Constitutional:  Negative for chills, activity change, appetite change and fatigue.  HENT: Negative for congestion, mouth sores and sinus pressure.   Eyes: Negative for visual disturbance.  Respiratory: Negative for cough, chest tightness, shortness of breath and wheezing.   Cardiovascular: Positive for R leg swelling. Negative for chest pain and palpitations.  Gastrointestinal: Negative for nausea and abdominal pain.  Genitourinary: Negative for frequency, difficulty urinating and vaginal pain.  Musculoskeletal: Negative for back pain and gait problem.  Skin: Negative for pallor and rash.  Neurological: Negative for dizziness, tremors, weakness, numbness and headaches.  Psychiatric/Behavioral: Negative for confusion and sleep disturbance.    Wt Readings from Last 3 Encounters:  12/08/14 170 lb (77.111 kg)  08/09/14 174 lb (78.926 kg)  01/04/14 170 lb 9.6 oz (77.384 kg)   BP Readings from Last 3 Encounters:  12/08/14 150/90  08/09/14 162/72  01/04/14 180/98        Objective:   Physical Exam  Constitutional: She appears well-developed. No distress.  HENT:  Head: Normocephalic.  Right Ear: External ear normal.  Left Ear: External ear normal.  Nose: Nose normal.  Mouth/Throat: Oropharynx is clear and moist.  Eyes: Conjunctivae are normal. Pupils are equal, round, and reactive to light. Right eye exhibits no discharge. Left eye exhibits no discharge.  Neck: Normal range of motion. Neck supple. No JVD present. No tracheal deviation present. No thyromegaly present.  Cardiovascular: Normal rate, regular rhythm and normal heart sounds.   Pulmonary/Chest: No stridor. No respiratory distress. She has no wheezes.  Abdominal: Soft. Bowel sounds are normal. She exhibits no distension and no mass. There is no tenderness. There is no rebound and no guarding.  Musculoskeletal: She  exhibits R lat ankle edema (1+ ). She exhibits no tenderness.  Lymphadenopathy:    She has no cervical adenopathy.  Neurological: She displays normal reflexes. No cranial nerve deficit. She exhibits normal muscle tone. Coordination normal.  Skin: No rash noted. No erythema.  Psychiatric: She has a normal mood and affect. Her behavior is normal. Judgment and thought content normal.   Lab Results  Component Value Date   WBC 4.4* 04/17/2011   HGB 15.3* 10/05/2012   HCT 45.0 10/05/2012   PLT 310.0 04/17/2011   GLUCOSE 82 08/09/2014   CHOL 140 08/09/2014   TRIG 52.0 08/09/2014   HDL 45.70 08/09/2014   LDLCALC 84 08/09/2014   ALT 12 06/04/2013   ALT 12 06/04/2013   AST 15 06/04/2013   AST 15 06/04/2013   NA 143 08/09/2014   K 3.8 08/09/2014   CL 108 08/09/2014   CREATININE 0.7 08/09/2014   BUN 17 08/09/2014   CO2 27 08/09/2014   TSH 0.02* 08/09/2014          Assessment & Plan:

## 2014-12-08 NOTE — Assessment & Plan Note (Signed)
Tiazac is likely aggravating. Possible old injury D/c Tiazac

## 2014-12-08 NOTE — Patient Instructions (Signed)
Stop Tiazac Start Coreg instead Use compression knee highs during the day

## 2014-12-08 NOTE — Assessment & Plan Note (Signed)
Tiazac is likely aggravating edema.  D/c Tiazac. Start Coreg

## 2014-12-09 ENCOUNTER — Telehealth: Payer: Self-pay | Admitting: Internal Medicine

## 2014-12-09 NOTE — Telephone Encounter (Signed)
emmi mailed  °

## 2015-02-09 ENCOUNTER — Ambulatory Visit (INDEPENDENT_AMBULATORY_CARE_PROVIDER_SITE_OTHER): Payer: Medicare Other | Admitting: Internal Medicine

## 2015-02-09 ENCOUNTER — Encounter: Payer: Self-pay | Admitting: Internal Medicine

## 2015-02-09 ENCOUNTER — Other Ambulatory Visit (INDEPENDENT_AMBULATORY_CARE_PROVIDER_SITE_OTHER): Payer: Medicare Other

## 2015-02-09 VITALS — BP 138/100 | HR 64 | Wt 165.0 lb

## 2015-02-09 DIAGNOSIS — I10 Essential (primary) hypertension: Secondary | ICD-10-CM

## 2015-02-09 DIAGNOSIS — E0590 Thyrotoxicosis, unspecified without thyrotoxic crisis or storm: Secondary | ICD-10-CM

## 2015-02-09 DIAGNOSIS — M25561 Pain in right knee: Secondary | ICD-10-CM

## 2015-02-09 DIAGNOSIS — R609 Edema, unspecified: Secondary | ICD-10-CM | POA: Diagnosis not present

## 2015-02-09 LAB — BASIC METABOLIC PANEL
BUN: 9 mg/dL (ref 6–23)
CO2: 28 mEq/L (ref 19–32)
Calcium: 9.9 mg/dL (ref 8.4–10.5)
Chloride: 107 mEq/L (ref 96–112)
Creatinine, Ser: 0.68 mg/dL (ref 0.40–1.20)
GFR: 107.91 mL/min (ref >60.00)
Glucose, Bld: 92 mg/dL (ref 70–99)
Potassium: 4 mEq/L (ref 3.5–5.1)
SODIUM: 140 meq/L (ref 135–145)

## 2015-02-09 LAB — TSH: TSH: 0.03 u[IU]/mL — AB (ref 0.35–4.50)

## 2015-02-09 LAB — T4, FREE: Free T4: 1.63 ng/dL — ABNORMAL HIGH (ref 0.60–1.60)

## 2015-02-09 MED ORDER — LISINOPRIL 40 MG PO TABS: 40.0000 mg | ORAL_TABLET | Freq: Every day | ORAL | Status: DC

## 2015-02-09 MED ORDER — CARVEDILOL 25 MG PO TABS: 25.0000 mg | ORAL_TABLET | Freq: Two times a day (BID) | ORAL | Status: DC

## 2015-02-09 MED ORDER — MELOXICAM 7.5 MG PO TABS: 7.5000 mg | ORAL_TABLET | Freq: Every day | ORAL | Status: DC

## 2015-02-09 NOTE — Assessment & Plan Note (Signed)
Better BMET

## 2015-02-09 NOTE — Assessment & Plan Note (Signed)
Labs

## 2015-02-09 NOTE — Assessment & Plan Note (Signed)
R posterior shin pain - MSK. Relieved by stretching out leg.  New shoes w/good arch support Appt w/Dr Tamala Julian Re-start water aerobics

## 2015-02-09 NOTE — Patient Instructions (Signed)
New shoes w/good arch support

## 2015-02-09 NOTE — Assessment & Plan Note (Signed)
Cont meds - Coreg, Lisinopril

## 2015-02-09 NOTE — Progress Notes (Signed)
Pre visit review using our clinic review tool, if applicable. No additional management support is needed unless otherwise documented below in the visit note. 

## 2015-02-09 NOTE — Progress Notes (Signed)
  Subjective:    Patient ID: Deborah Myers, female    DOB: [birth year], 79 y.o.   MRN: [id number]  Leg Pain  The incident occurred more than 1 week ago (started 3 mo ago). There was no injury mechanism. The pain is present in the right leg (posterior shin). The quality of the pain is described as aching. The pain is mild. Pertinent negatives include no numbness. Exacerbated by: sitting.    F/u B LE swelling over past 2 wks. She has been snacking on potato chips, popcorn lately   Review of Systems  Constitutional: Positive for unexpected weight change. Negative for chills, activity change, appetite change and fatigue.  HENT: Negative for congestion, mouth sores and sinus pressure.   Eyes: Negative for visual disturbance.  Respiratory: Negative for cough, chest tightness, shortness of breath and wheezing.   Cardiovascular: Positive for leg swelling. Negative for chest pain and palpitations.  Gastrointestinal: Negative for nausea and abdominal pain.  Genitourinary: Negative for frequency, difficulty urinating and vaginal pain.  Musculoskeletal: Negative for back pain and gait problem.  Skin: Negative for pallor and rash.  Neurological: Negative for dizziness, tremors, weakness, numbness and headaches.  Psychiatric/Behavioral: Negative for confusion and sleep disturbance.    Wt Readings from Last 3 Encounters:  02/09/15 165 lb (74.844 kg)  12/08/14 170 lb (77.111 kg)  08/09/14 174 lb (78.926 kg)   BP Readings from Last 3 Encounters:  02/09/15 138/100  12/08/14 150/90  08/09/14 162/72        Objective:   Physical Exam  Constitutional: She appears well-developed. No distress.  HENT:  Head: Normocephalic.  Right Ear: External ear normal.  Left Ear: External ear normal.  Nose: Nose normal.  Mouth/Throat: Oropharynx is clear and moist.  Eyes: Conjunctivae are normal. Pupils are equal, round, and reactive to light. Right eye exhibits no discharge. Left eye exhibits no  discharge.  Neck: Normal range of motion. Neck supple. No JVD present. No tracheal deviation present. No thyromegaly present.  Cardiovascular: Normal rate, regular rhythm and normal heart sounds.   Pulmonary/Chest: No stridor. No respiratory distress. She has no wheezes.  Abdominal: Soft. Bowel sounds are normal. She exhibits no distension and no mass. There is no tenderness. There is no rebound and no guarding.  Musculoskeletal: She exhibits edema (trace B). She exhibits no tenderness.  Lymphadenopathy:    She has no cervical adenopathy.  Neurological: She displays normal reflexes. No cranial nerve deficit. She exhibits normal muscle tone. Coordination normal.  Skin: No rash noted. No erythema.  Psychiatric: She has a normal mood and affect. Her behavior is normal. Judgment and thought content normal.  B shins NT, ankles NT B knees w/OA changes Flat feet  Lab Results  Component Value Date   WBC 4.4* 04/17/2011   HGB 15.3* 10/05/2012   HCT 45.0 10/05/2012   PLT 310.0 04/17/2011   GLUCOSE 82 08/09/2014   CHOL 140 08/09/2014   TRIG 52.0 08/09/2014   HDL 45.70 08/09/2014   LDLCALC 84 08/09/2014   ALT 12 06/04/2013   ALT 12 06/04/2013   AST 15 06/04/2013   AST 15 06/04/2013   NA 143 08/09/2014   K 3.8 08/09/2014   CL 108 08/09/2014   CREATININE 0.7 08/09/2014   BUN 17 08/09/2014   CO2 27 08/09/2014   TSH 0.02* 08/09/2014          Assessment & Plan:

## 2015-02-23 ENCOUNTER — Encounter: Payer: Self-pay | Admitting: Endocrinology

## 2015-02-23 ENCOUNTER — Ambulatory Visit (INDEPENDENT_AMBULATORY_CARE_PROVIDER_SITE_OTHER): Payer: Medicare Other | Admitting: Endocrinology

## 2015-02-23 VITALS — BP 144/90 | HR 69 | Temp 97.7°F | Ht 66.5 in | Wt 166.0 lb

## 2015-02-23 DIAGNOSIS — E0520 Thyrotoxicosis with toxic multinodular goiter without thyrotoxic crisis or storm: Secondary | ICD-10-CM | POA: Diagnosis not present

## 2015-02-23 NOTE — Patient Instructions (Addendum)
I have ordered for you a treatment pill of radioactive iodine.  Although it is a larger amount of radiation, you will again notice no symptoms from this.  The pill is gone from your body in a few days (during which you should stay away from other people), but takes several months to work.  Therefore, please return here approximately 6-8 weeks after the treatment.  This treatment has been available for many years, and the only known side-effect is an underactive thyroid.  It is possible that i would eventually prescribe for you a thyroid hormone pill, which is very inexpensive.  You don't have to worry about side-effects of this thyroid hormone pill, because it is the same molecule your thyroid makes.

## 2015-02-23 NOTE — Progress Notes (Signed)
Subjective:    Patient ID: Deborah Myers, female    DOB: 02-24-1936, 79 y.o.   MRN: [id number]  HPI Pt returns for f/u of hyperthyroidism dye to multinodular goiter (dx'ed 2010; she had scan in 2014, but did not do I-131 rx).  pt states she feels well in general.   Past Medical History  Diagnosis Date  . Hyperlipidemia   . HTN (hypertension)   . Cecum mass 2008    Benign   . Arthritis   . Cataract   . Osteoporosis     no per pt    Past Surgical History  Procedure Laterality Date  . Colectomy  2008    Benign mass - laparoscopic right  . Colonoscopy      History   Social History  . Marital Status: Single    Spouse Name: N/A  . Number of Children: N/A  . Years of Education: 16   Occupational History  . RETIRED [employer]   Social History Main Topics  . Smoking status: Never Smoker   . Smokeless tobacco: Never Used  . Alcohol Use: 6.2 oz/week    4 Standard drinks or equivalent, 7 Glasses of wine per week  . Drug Use: No  . Sexual Activity: Not Currently   Other Topics Concern  . Not on file   Social History Narrative   HSG, [employer]. Never married. No children. Work - retired from [employer] after 34 years. Lives alone.   End of Life Care - interested in this. Provided a packet -Mar 19, 2012.    Current Outpatient Prescriptions on File Prior to Visit  Medication Sig Dispense Refill  . Ascorbic Acid (VITAMIN C) 1000 MG tablet Take 1,000 mg by mouth daily.      . Calcium Carbonate-Vitamin D (CALCIUM-VITAMIN D) 600-200 MG-UNIT CAPS Take 1 tablet by mouth daily.     . carvedilol (COREG) 25 MG tablet Take 1 tablet (25 mg total) by mouth 2 (two) times daily with a meal. 180 tablet 3  . COD LIVER OIL PO Take by mouth daily.      . furosemide (LASIX) 40 MG tablet Take 1 tablet (40 mg total) by mouth daily as needed. 30 tablet 3  . lisinopril (PRINIVIL,ZESTRIL) 40 MG tablet Take 1 tablet (40 mg total) by mouth daily. 90 tablet 3  . meloxicam (MOBIC) 7.5 MG tablet Take 1  tablet (7.5 mg total) by mouth daily. 30 tablet 1  . potassium chloride SA (K-DUR,KLOR-CON) 20 MEQ tablet take 1 tablet by mouth once daily 30 tablet 5  . simvastatin (ZOCOR) 40 MG tablet take 1 tablet by mouth every AT BEDTIME 30 tablet 11   No current facility-administered medications on file prior to visit.    Allergies  Allergen Reactions  . Tiazac [Diltiazem]     edema    Family History  Problem Relation Age of Onset  . Coronary artery disease Mother   . Diabetes Mother   . Heart disease Mother     CHF  . Coronary artery disease Father   . Diabetes Father   . Heart disease Father   . Diabetes Sister   . Diabetes Brother   . Cancer Neg Hx   . COPD Neg Hx   . Colon cancer Neg Hx   . Esophageal cancer Neg Hx   . Stomach cancer Neg Hx   . Rectal cancer Neg Hx     BP 144/90 mmHg  Pulse 69  Temp(Src) 97.7 F (36.5 C) (  Oral)  Ht 5' 6.5" (1.689 m)  Wt 166 lb (75.297 kg)  BMI 26.39 kg/m2  SpO2 98%    Review of Systems She denies tremor and excessive diaphoresis    Objective:   Physical Exam VITAL SIGNS:  See vs page GENERAL: no distress Neck: thyroid is 3-5 times normal size (R>L), with multinodular surface. Skin: not diaphoretic Neuro: no tremor Ext: no edema PSYCH: Alert and well-oriented.  Does not appear anxious nor depressed.   Lab Results  Component Value Date   TSH 0.03* 02/09/2015   T4TOTAL 7.9 03/24/2009       Assessment & Plan:  Hyperthyroidism, worse HTN: mild, possibly situational exacerbation.  We'll follow Multinodular goiter: unchanged on physical exam.  Patient is advised the following: Patient Instructions  I have ordered for you a treatment pill of radioactive iodine.  Although it is a larger amount of radiation, you will again notice no symptoms from this.  The pill is gone from your body in a few days (during which you should stay away from other people), but takes several months to work.  Therefore, please return here  approximately 6-8 weeks after the treatment.  This treatment has been available for many years, and the only known side-effect is an underactive thyroid.  It is possible that i would eventually prescribe for you a thyroid hormone pill, which is very inexpensive.  You don't have to worry about side-effects of this thyroid hormone pill, because it is the same molecule your thyroid makes.

## 2015-03-03 ENCOUNTER — Telehealth: Payer: Self-pay

## 2015-03-03 ENCOUNTER — Other Ambulatory Visit: Payer: Self-pay | Admitting: Endocrinology

## 2015-03-03 DIAGNOSIS — E0520 Thyrotoxicosis with toxic multinodular goiter without thyrotoxic crisis or storm: Secondary | ICD-10-CM

## 2015-03-03 NOTE — Telephone Encounter (Signed)
[facility] called stating pt would need to have a uptake scan done first before scheduling the RAI. Last uptake scan was in 2014.

## 2015-03-03 NOTE — Telephone Encounter (Signed)
[facility] notified order has been placed.

## 2015-03-03 NOTE — Telephone Encounter (Signed)
i ordered

## 2015-03-10 ENCOUNTER — Ambulatory Visit (INDEPENDENT_AMBULATORY_CARE_PROVIDER_SITE_OTHER): Payer: Medicare Other | Admitting: Family Medicine

## 2015-03-10 ENCOUNTER — Encounter: Payer: Self-pay | Admitting: Family Medicine

## 2015-03-10 VITALS — BP 130/70 | HR 65 | Ht 66.5 in | Wt 166.0 lb

## 2015-03-10 DIAGNOSIS — M25561 Pain in right knee: Secondary | ICD-10-CM

## 2015-03-10 DIAGNOSIS — IMO0002 Reserved for concepts with insufficient information to code with codable children: Secondary | ICD-10-CM | POA: Insufficient documentation

## 2015-03-10 DIAGNOSIS — M129 Arthropathy, unspecified: Secondary | ICD-10-CM | POA: Diagnosis not present

## 2015-03-10 NOTE — Progress Notes (Signed)
Pre visit review using our clinic review tool, if applicable. No additional management support is needed unless otherwise documented below in the visit note. 

## 2015-03-10 NOTE — Progress Notes (Signed)
[facility] [place] [place], [place] [address] Phone: [phone] Subjective:    I'm seeing this patient by the request  of:  Walker Kehr, MD   CC: Posterior leg pain.  [id number] Deborah Myers is a 79 y.o. female coming in with complaint of posterior leg pain. Patient was seen by primary care provider one month ago that appeared to have more of a musculoskeletal component. Patient states that she is having pain even 3 months before she saw the primary care provider. Patient denies any true injury. States that it feels like it is behind the shin bone. Pain is mild over all but unfortunately seems to be worse with sitting long periods of time and if she's tries to increase her activity. Patient states that it feels better when she does wear a knee brace. Patient has not been very active and has not been doing as much water aerobics recently. Patient does have swelling of the lower extremity she states but nothing more than usual. No pain that wakes her up at night. Rates the severity of pain as 4 out of 10 area and patient states that she can of days where she has no pain at all.  Past Medical History  Diagnosis Date  . Hyperlipidemia   . HTN (hypertension)   . Cecum mass 2008    Benign   . Arthritis   . Cataract   . Osteoporosis     no per pt   Past Surgical History  Procedure Laterality Date  . Colectomy  2008    Benign mass - laparoscopic right  . Colonoscopy     History   Social History  . Marital Status: Single    Spouse Name: N/A  . Number of Children: N/A  . Years of Education: 16   Occupational History  . RETIRED [employer]   Social History Main Topics  . Smoking status: Never Smoker   . Smokeless tobacco: Never Used  . Alcohol Use: 6.2 oz/week    4 Standard drinks or equivalent, 7 Glasses of wine per week  . Drug Use: No  . Sexual Activity: Not Currently   Other Topics Concern  . Not on file   Social History  Narrative   HSG, [employer]. Never married. No children. Work - retired from [employer] after 34 years. Lives alone.   End of Life Care - interested in this. Provided a packet -Mar 19, 2012.   Allergies  Allergen Reactions  . Tiazac [Diltiazem]     edema   Family History  Problem Relation Age of Onset  . Coronary artery disease Mother   . Diabetes Mother   . Heart disease Mother     CHF  . Coronary artery disease Father   . Diabetes Father   . Heart disease Father   . Diabetes Sister   . Diabetes Brother   . Cancer Neg Hx   . COPD Neg Hx   . Colon cancer Neg Hx   . Esophageal cancer Neg Hx   . Stomach cancer Neg Hx   . Rectal cancer Neg Hx         Past medical history, social, surgical and family history all reviewed in electronic medical record.   Review of Systems: No headache, visual changes, nausea, vomiting, diarrhea, constipation, dizziness, abdominal pain, skin rash, fevers, chills, night sweats, weight loss, swollen lymph nodes, body aches, joint swelling, muscle aches, chest pain, shortness of breath, mood changes.  Objective Blood pressure 130/70, pulse 65, height 5' 6.5" (1.689 m), weight 166 lb (75.297 kg), SpO2 97 %.  General: No apparent distress alert and oriented x3 mood and affect normal, dressed appropriately.  HEENT: Pupils equal, extraocular movements intact  Respiratory: Patient's speak in full sentences and does not appear short of breath  Cardiovascular: +1 lower extremity edema, non tender, no erythema  Skin: Warm dry intact with no signs of infection or rash on extremities or on axial skeleton.  Abdomen: Soft nontender  Neuro: Cranial nerves II through XII are intact, neurovascularly intact in all extremities with 2+ DTRs and 2+ pulses.  Lymph: No lymphadenopathy of posterior or anterior cervical chain or axillae bilaterally.  Gait normal with good balance and coordination.  MSK:  Non tender with full range of motion and good stability and symmetric  strength and tone of shoulders, elbows, wrist, hip, and ankles bilaterally.  Knee: Right Valgus deformity noted Minorly tender to palpation over the posterior medial joint line ROM full in flexion and extension and lower leg rotation. Ligaments with solid consistent endpoints including ACL, PCL, LCL, MCL. Negative Mcmurray's, Apley's, and Thessalonian tests. Mild painful patellar compression. Patellar glide with moderate crepitus. Patellar and quadriceps tendons unremarkable. Hamstring and quadriceps strength is normal.  Is nontender on exam today. Full range of motion of the ankle with full strength. Neurovascular intact distally. Contralateral knee mild valgus deformity compared to contralateral side.  Procedure note 33435; 15 minutes spent for Therapeutic exercises as stated in above notes.  This included exercises focusing on stretching, strengthening, with significant focus on eccentric aspects.  The exercises focusing on flexion and extension stretching as well as hamstring and quadriceps strengthening. Discussed vastus medialis oblique strengthening and wall sits. Discuss hip abductor strengthening. Proper technique shown and discussed handout in great detail with ATC.  All questions were discussed and answered.      Impression and Recommendations:     This case required medical decision making of moderate complexity.

## 2015-03-10 NOTE — Patient Instructions (Addendum)
Good to see you.  Ice 20 minutes 2 times daily. Usually after activity and before bed. Exercises 3 times a week.  Get back in water aerobics Take tylenol 325 mg three times a day is the best evidence based medicine we have for arthritis.  Glucosamine sulfate 1500mg  a day is a supplement that has been shown to help moderate to severe arthritis. Vitamin D 2000 IU daily Fish oil 2 grams daily.  Tumeric 500mg  twice daily.  Capsaicin topically up to four times a day may also help with pain. Cortisone injections are an option if these interventions do not seem to make a difference or need more relief.  If cortisone injections do not help, there are different types of shots that may help but they take longer to take effect.  We can discuss this at follow up.  It's important that you continue to stay active. Controlling your weight is important.  Consider physical therapy to strengthen muscles around the joint that hurts to take pressure off of the joint itself. Good shoes with rigid bottom.  Jalene Mullet, [place] or New balance greater then [employer] and cycling with low resistance are the best two types of exercise for arthritis. Come back and see me in 3 weeks.

## 2015-03-10 NOTE — Assessment & Plan Note (Signed)
I believe the patient's pain is secondary to more the osteophytic changes in her knee. Procedure feel that imaging is necessary at this time. Patient given a medial unloader brace today. We discussed home exercises, icing protocol, as well as what activities to potentially avoid and which ones would be beneficial. We discussed proper shoewear and over-the-counter natural supplementations a could be helpful. Patient is going to try to make these changes and come back in 3 weeks. Continuing have pain we'll consider imaging as well as potential injection if necessary.

## 2015-03-16 ENCOUNTER — Encounter (HOSPITAL_COMMUNITY)
Admission: RE | Admit: 2015-03-16 | Discharge: 2015-03-16 | Disposition: A | Discharge status: Home / Self Care | Payer: Medicare Other | Source: Ambulatory Visit | Attending: Endocrinology | Admitting: Endocrinology

## 2015-03-16 DIAGNOSIS — E0520 Thyrotoxicosis with toxic multinodular goiter without thyrotoxic crisis or storm: Secondary | ICD-10-CM | POA: Insufficient documentation

## 2015-03-17 ENCOUNTER — Encounter (HOSPITAL_COMMUNITY)
Admission: RE | Admit: 2015-03-17 | Discharge: 2015-03-17 | Disposition: A | Discharge status: Home / Self Care | Payer: Medicare Other | Source: Ambulatory Visit | Attending: Endocrinology | Admitting: Endocrinology

## 2015-03-17 ENCOUNTER — Other Ambulatory Visit: Payer: Self-pay | Admitting: Endocrinology

## 2015-03-17 DIAGNOSIS — E0520 Thyrotoxicosis with toxic multinodular goiter without thyrotoxic crisis or storm: Secondary | ICD-10-CM

## 2015-03-17 DIAGNOSIS — E042 Nontoxic multinodular goiter: Secondary | ICD-10-CM | POA: Diagnosis not present

## 2015-03-17 MED ORDER — SODIUM IODIDE I 131 CAPSULE
12.2000 | Freq: Once | INTRAVENOUS | Status: AC | PRN
  Administered 2015-03-17: 12.2 via ORAL

## 2015-03-31 ENCOUNTER — Ambulatory Visit: Payer: Medicare Other | Admitting: Family Medicine

## 2015-03-31 ENCOUNTER — Encounter (HOSPITAL_COMMUNITY)
Admission: RE | Admit: 2015-03-31 | Discharge: 2015-03-31 | Disposition: A | Discharge status: Home / Self Care | Payer: Medicare Other | Source: Ambulatory Visit | Attending: Endocrinology | Admitting: Endocrinology

## 2015-03-31 DIAGNOSIS — E0520 Thyrotoxicosis with toxic multinodular goiter without thyrotoxic crisis or storm: Secondary | ICD-10-CM | POA: Diagnosis not present

## 2015-03-31 DIAGNOSIS — Z0289 Encounter for other administrative examinations: Secondary | ICD-10-CM

## 2015-03-31 DIAGNOSIS — E042 Nontoxic multinodular goiter: Secondary | ICD-10-CM | POA: Diagnosis not present

## 2015-03-31 DIAGNOSIS — E0590 Thyrotoxicosis, unspecified without thyrotoxic crisis or storm: Secondary | ICD-10-CM | POA: Diagnosis not present

## 2015-03-31 MED ORDER — SODIUM IODIDE I 131 CAPSULE: 30.0000 | Freq: Once | INTRAVENOUS | Status: AC | PRN

## 2015-04-07 ENCOUNTER — Other Ambulatory Visit: Payer: Self-pay | Admitting: Internal Medicine

## 2015-05-30 DIAGNOSIS — Z961 Presence of intraocular lens: Secondary | ICD-10-CM | POA: Diagnosis not present

## 2015-06-14 ENCOUNTER — Ambulatory Visit: Payer: Medicare Other | Admitting: Internal Medicine

## 2015-07-04 ENCOUNTER — Encounter: Payer: Self-pay | Admitting: Internal Medicine

## 2015-07-04 ENCOUNTER — Ambulatory Visit (INDEPENDENT_AMBULATORY_CARE_PROVIDER_SITE_OTHER): Payer: Medicare Other | Admitting: Internal Medicine

## 2015-07-04 VITALS — BP 180/98 | HR 59 | Wt 167.0 lb

## 2015-07-04 DIAGNOSIS — M25561 Pain in right knee: Secondary | ICD-10-CM | POA: Diagnosis not present

## 2015-07-04 DIAGNOSIS — R609 Edema, unspecified: Secondary | ICD-10-CM

## 2015-07-04 DIAGNOSIS — I10 Essential (primary) hypertension: Secondary | ICD-10-CM

## 2015-07-04 MED ORDER — MELOXICAM 7.5 MG PO TABS: 7.5000 mg | ORAL_TABLET | Freq: Every day | ORAL | Status: DC | PRN

## 2015-07-04 NOTE — Assessment & Plan Note (Addendum)
Chronic  Check BP at home Coreg, Lisinopril, Lasix Risks associated with treatment noncompliance were discussed. Compliance was encouraged.

## 2015-07-04 NOTE — Assessment & Plan Note (Signed)
R posterior shin pain - MSK. Relieved by stretching out leg.  Meloxicam prn Dr Tamala Julian

## 2015-07-04 NOTE — Progress Notes (Signed)
Pre visit review using our clinic review tool, if applicable. No additional management support is needed unless otherwise documented below in the visit note. 

## 2015-07-04 NOTE — Assessment & Plan Note (Signed)
R ankle mostly Lasix prn

## 2015-07-04 NOTE — Progress Notes (Signed)
Subjective:  Patient ID: Deborah Myers, female    DOB: [birth year]  Age: 79 y.o. MRN: [id number]  CC: No chief complaint on file.   HPI Deborah Myers presents for HTN, edema, knee pain f/up. BP ok at home  Outpatient Prescriptions Prior to Visit  Medication Sig Dispense Refill  . Ascorbic Acid (VITAMIN C) 1000 MG tablet Take 1,000 mg by mouth daily.      . Calcium Carbonate-Vitamin D (CALCIUM-VITAMIN D) 600-200 MG-UNIT CAPS Take 1 tablet by mouth daily.     . carvedilol (COREG) 25 MG tablet Take 1 tablet (25 mg total) by mouth 2 (two) times daily with a meal. 180 tablet 3  . COD LIVER OIL PO Take by mouth daily.      . furosemide (LASIX) 40 MG tablet Take 1 tablet (40 mg total) by mouth daily as needed. 30 tablet 3  . lisinopril (PRINIVIL,ZESTRIL) 40 MG tablet Take 1 tablet (40 mg total) by mouth daily. 90 tablet 3  . meloxicam (MOBIC) 7.5 MG tablet Take 1 tablet (7.5 mg total) by mouth daily. 30 tablet 1  . potassium chloride SA (K-DUR,KLOR-CON) 20 MEQ tablet take 1 tablet by mouth once daily 30 tablet 5  . simvastatin (ZOCOR) 40 MG tablet take 1 tablet by mouth every AT BEDTIME 30 tablet 11   No facility-administered medications prior to visit.    ROS Review of Systems  Constitutional: Negative for chills, activity change, appetite change, fatigue and unexpected weight change.  HENT: Negative for congestion, mouth sores and sinus pressure.   Eyes: Negative for visual disturbance.  Respiratory: Negative for cough and chest tightness.   Cardiovascular: Negative for leg swelling.  Gastrointestinal: Negative for nausea and abdominal pain.  Genitourinary: Negative for frequency, difficulty urinating and vaginal pain.  Musculoskeletal: Positive for arthralgias. Negative for back pain and gait problem.  Skin: Negative for pallor and rash.  Neurological: Negative for dizziness, tremors, weakness, numbness and headaches.  Psychiatric/Behavioral: Negative for confusion and  sleep disturbance. The patient is not nervous/anxious.     Objective:  BP 180/98 mmHg  Pulse 59  Wt 167 lb (75.751 kg)  SpO2 97%  BP Readings from Last 3 Encounters:  07/04/15 180/98  03/10/15 130/70  02/23/15 144/90    Wt Readings from Last 3 Encounters:  07/04/15 167 lb (75.751 kg)  03/10/15 166 lb (75.297 kg)  02/23/15 166 lb (75.297 kg)    Physical Exam  Constitutional: She appears well-developed. No distress.  HENT:  Head: Normocephalic.  Right Ear: External ear normal.  Left Ear: External ear normal.  Nose: Nose normal.  Mouth/Throat: Oropharynx is clear and moist.  Eyes: Conjunctivae are normal. Pupils are equal, round, and reactive to light. Right eye exhibits no discharge. Left eye exhibits no discharge.  Neck: Normal range of motion. Neck supple. No JVD present. No tracheal deviation present. No thyromegaly present.  Cardiovascular: Normal rate, regular rhythm and normal heart sounds.   Pulmonary/Chest: No stridor. No respiratory distress. She has no wheezes.  Abdominal: Soft. Bowel sounds are normal. She exhibits no distension and no mass. There is no tenderness. There is no rebound and no guarding.  Musculoskeletal: She exhibits tenderness. She exhibits no edema.  Lymphadenopathy:    She has no cervical adenopathy.  Neurological: She displays normal reflexes. No cranial nerve deficit. She exhibits normal muscle tone. Coordination normal.  Skin: No rash noted. No erythema.  Psychiatric: She has a normal mood and affect. Her behavior is normal. Judgment and  thought content normal.  R knee hurts w/ROM  Lab Results  Component Value Date   WBC 4.4* 04/17/2011   HGB 15.3* 10/05/2012   HCT 45.0 10/05/2012   PLT 310.0 04/17/2011   GLUCOSE 92 02/09/2015   CHOL 140 08/09/2014   TRIG 52.0 08/09/2014   HDL 45.70 08/09/2014   LDLCALC 84 08/09/2014   ALT 12 06/04/2013   ALT 12 06/04/2013   AST 15 06/04/2013   AST 15 06/04/2013   NA 140 02/09/2015   K 4.0  02/09/2015   CL 107 02/09/2015   CREATININE 0.68 02/09/2015   BUN 9 02/09/2015   CO2 28 02/09/2015   TSH 0.03* 02/09/2015    Nm Rai Therapy For Hyperthyroidism  03/31/2015   CLINICAL DATA:  Hyperthyroidism.  Multinodular goiter.  EXAM: RADIOACTIVE IODINE THERAPY FOR HYPERTHYROIDISM  COMPARISON:  Ultrasound 03/31/2009. Nuclear medicine thyroid uptake and scan 03/17/2015.  TECHNIQUE: Radioactive iodine prescribed by myself after reviewing the patient's prior studies yesterday. The risks and benefits of radioactive iodine therapy were discussed with the patient in detail by Dr. Carlis Abbott. Alternative therapies were also mentioned. Radiation safety was discussed with the patient, including how to protect the general public from exposure. There were no barriers to communication. Written consent was obtained. The patient then received a capsule containing the radiopharmaceutical.  The patient will follow-up with the referring physician.  RADIOPHARMACEUTICALS:  30.3 mCi I-131 sodium iodide orally  IMPRESSION: Per oral administration of I-131 sodium iodide for the treatment of hyperthyroidism.   Electronically Signed   By: Richardean Sale M.D.   On: 03/31/2015 13:35    Assessment & Plan:   There are no diagnoses linked to this encounter. I am having Deborah Myers maintain her Calcium-Vitamin D, COD LIVER OIL PO, vitamin C, furosemide, potassium chloride SA, simvastatin, lisinopril, carvedilol, and meloxicam.  No orders of the defined types were placed in this encounter.     Follow-up: No Follow-up on file.  Walker Kehr, MD

## 2015-11-03 ENCOUNTER — Encounter: Payer: Self-pay | Admitting: Internal Medicine

## 2015-11-03 ENCOUNTER — Ambulatory Visit (INDEPENDENT_AMBULATORY_CARE_PROVIDER_SITE_OTHER): Payer: Medicare Other | Admitting: Internal Medicine

## 2015-11-03 ENCOUNTER — Other Ambulatory Visit (INDEPENDENT_AMBULATORY_CARE_PROVIDER_SITE_OTHER): Payer: Medicare Other

## 2015-11-03 VITALS — BP 138/84 | HR 61 | Wt 165.0 lb

## 2015-11-03 DIAGNOSIS — E785 Hyperlipidemia, unspecified: Secondary | ICD-10-CM | POA: Diagnosis not present

## 2015-11-03 DIAGNOSIS — I10 Essential (primary) hypertension: Secondary | ICD-10-CM

## 2015-11-03 DIAGNOSIS — E0520 Thyrotoxicosis with toxic multinodular goiter without thyrotoxic crisis or storm: Secondary | ICD-10-CM | POA: Diagnosis not present

## 2015-11-03 DIAGNOSIS — Z23 Encounter for immunization: Secondary | ICD-10-CM | POA: Diagnosis not present

## 2015-11-03 LAB — BASIC METABOLIC PANEL
BUN: 14 mg/dL (ref 6–23)
CHLORIDE: 104 meq/L (ref 96–112)
CO2: 32 meq/L (ref 19–32)
CREATININE: 0.83 mg/dL (ref 0.40–1.20)
Calcium: 9.7 mg/dL (ref 8.4–10.5)
GFR: 85.57 mL/min (ref >60.00)
Glucose, Bld: 93 mg/dL (ref 70–99)
Potassium: 4.1 mEq/L (ref 3.5–5.1)
Sodium: 142 mEq/L (ref 135–145)

## 2015-11-03 LAB — T4, FREE: Free T4: 0.7 ng/dL (ref 0.60–1.60)

## 2015-11-03 LAB — CBC WITH DIFFERENTIAL/PLATELET
BASOS ABS: 0 10*3/uL (ref 0.0–0.1)
Basophils Relative: 0.6 % (ref 0.0–3.0)
EOS ABS: 0.4 10*3/uL (ref 0.0–0.7)
Eosinophils Relative: 8.8 % — ABNORMAL HIGH (ref 0.0–5.0)
HEMATOCRIT: 44.2 % (ref 36.0–46.0)
HEMOGLOBIN: 14.3 g/dL (ref 12.0–15.0)
Lymphocytes Relative: 39.4 % (ref 12.0–46.0)
Lymphs Abs: 1.9 10*3/uL (ref 0.7–4.0)
MCHC: 32.3 g/dL (ref 30.0–36.0)
MCV: 86.8 fl (ref 78.0–100.0)
MONOS PCT: 10.8 % (ref 3.0–12.0)
Monocytes Absolute: 0.5 10*3/uL (ref 0.1–1.0)
NEUTROS ABS: 2 10*3/uL (ref 1.4–7.7)
Neutrophils Relative %: 40.4 % — ABNORMAL LOW (ref 43.0–77.0)
PLATELETS: 271 10*3/uL (ref 150.0–400.0)
RBC: 5.09 Mil/uL (ref 3.87–5.11)
RDW: 13 % (ref 11.5–15.5)
WBC: 4.9 10*3/uL (ref 4.0–10.5)

## 2015-11-03 LAB — LIPID PANEL
CHOL/HDL RATIO: 4
Cholesterol: 243 mg/dL — ABNORMAL HIGH (ref 0–200)
HDL: 57.6 mg/dL (ref >39.00)
LDL CALC: 171 mg/dL — AB (ref 0–99)
NonHDL: 185.83
Triglycerides: 74 mg/dL (ref 0.0–149.0)
VLDL: 14.8 mg/dL (ref 0.0–40.0)

## 2015-11-03 LAB — TSH: TSH: 3.1 u[IU]/mL (ref 0.35–4.50)

## 2015-11-03 NOTE — Assessment & Plan Note (Signed)
F/u w/Dr Loanne Drilling - treated in 4/16 Labs

## 2015-11-03 NOTE — Assessment & Plan Note (Signed)
BP ok at home Coreg, Lisinopril, Lasix Risks associated with treatment noncompliance were discussed. Compliance was encouraged Labs

## 2015-11-03 NOTE — Progress Notes (Signed)
Subjective:  Patient ID: Deborah Myers, female    DOB: 1936/04/27  Age: 79 y.o. MRN: [id number]  CC: No chief complaint on file.   HPI [name] presents for HTN f/u - did not take her meds yet. F/u HTN  Outpatient Prescriptions Prior to Visit  Medication Sig Dispense Refill  . Ascorbic Acid (VITAMIN C) 1000 MG tablet Take 1,000 mg by mouth daily.      . Calcium Carbonate-Vitamin D (CALCIUM-VITAMIN D) 600-200 MG-UNIT CAPS Take 1 tablet by mouth daily.     . carvedilol (COREG) 25 MG tablet Take 1 tablet (25 mg total) by mouth 2 (two) times daily with a meal. 180 tablet 3  . COD LIVER OIL PO Take by mouth daily.      . furosemide (LASIX) 40 MG tablet Take 1 tablet (40 mg total) by mouth daily as needed. 30 tablet 3  . lisinopril (PRINIVIL,ZESTRIL) 40 MG tablet Take 1 tablet (40 mg total) by mouth daily. 90 tablet 3  . meloxicam (MOBIC) 7.5 MG tablet Take 1 tablet (7.5 mg total) by mouth daily as needed for pain. 30 tablet 3  . potassium chloride SA (K-DUR,KLOR-CON) 20 MEQ tablet take 1 tablet by mouth once daily 30 tablet 5  . simvastatin (ZOCOR) 40 MG tablet take 1 tablet by mouth every AT BEDTIME 30 tablet 11   No facility-administered medications prior to visit.    ROS Review of Systems  Constitutional: Negative for chills, activity change, appetite change, fatigue and unexpected weight change.  HENT: Negative for congestion, mouth sores and sinus pressure.   Eyes: Negative for visual disturbance.  Respiratory: Negative for cough and chest tightness.   Gastrointestinal: Negative for nausea and abdominal pain.  Genitourinary: Positive for frequency. Negative for difficulty urinating and vaginal pain.  Musculoskeletal: Negative for back pain and gait problem.  Skin: Negative for pallor and rash.  Neurological: Negative for dizziness, tremors, weakness, numbness and headaches.  Psychiatric/Behavioral: Negative for confusion and sleep disturbance.    Objective:  BP  138/84 mmHg  Pulse 61  Wt 165 lb (74.844 kg)  SpO2 97%  BP Readings from Last 3 Encounters:  11/03/15 138/84  07/04/15 180/98  03/10/15 130/70    Wt Readings from Last 3 Encounters:  11/03/15 165 lb (74.844 kg)  07/04/15 167 lb (75.751 kg)  03/10/15 166 lb (75.297 kg)    Physical Exam  Constitutional: She appears well-developed. No distress.  HENT:  Head: Normocephalic.  Right Ear: External ear normal.  Left Ear: External ear normal.  Nose: Nose normal.  Mouth/Throat: Oropharynx is clear and moist.  Eyes: Conjunctivae are normal. Pupils are equal, round, and reactive to light. Right eye exhibits no discharge. Left eye exhibits no discharge.  Neck: Normal range of motion. Neck supple. No JVD present. No tracheal deviation present. No thyromegaly present.  Cardiovascular: Normal rate, regular rhythm and normal heart sounds.   Pulmonary/Chest: No stridor. No respiratory distress. She has no wheezes.  Abdominal: Soft. Bowel sounds are normal. She exhibits no distension and no mass. There is no tenderness. There is no rebound and no guarding.  Musculoskeletal: She exhibits no edema or tenderness.  Lymphadenopathy:    She has no cervical adenopathy.  Neurological: She displays normal reflexes. No cranial nerve deficit. She exhibits normal muscle tone. Coordination normal.  Skin: No rash noted. No erythema.  Psychiatric: She has a normal mood and affect. Her behavior is normal. Judgment and thought content normal.  thyroid nodules - small  R knee hurts w/ROM  Lab Results  Component Value Date   WBC 4.9 11/03/2015   HGB 14.3 11/03/2015   HCT 44.2 11/03/2015   PLT 271.0 11/03/2015   GLUCOSE 93 11/03/2015   CHOL 243* 11/03/2015   TRIG 74.0 11/03/2015   HDL 57.60 11/03/2015   LDLCALC 171* 11/03/2015   ALT 12 06/04/2013   ALT 12 06/04/2013   AST 15 06/04/2013   AST 15 06/04/2013   NA 142 11/03/2015   K 4.1 11/03/2015   CL 104 11/03/2015   CREATININE 0.83 11/03/2015   BUN  14 11/03/2015   CO2 32 11/03/2015   TSH 3.10 11/03/2015    Nm Rai Therapy For Hyperthyroidism  03/31/2015  CLINICAL DATA:  Hyperthyroidism.  Multinodular goiter. EXAM: RADIOACTIVE IODINE THERAPY FOR HYPERTHYROIDISM COMPARISON:  Ultrasound 03/31/2009. Nuclear medicine thyroid uptake and scan 03/17/2015. TECHNIQUE: Radioactive iodine prescribed by myself after reviewing the patient's prior studies yesterday. The risks and benefits of radioactive iodine therapy were discussed with the patient in detail by Dr. Carlis Abbott. Alternative therapies were also mentioned. Radiation safety was discussed with the patient, including how to protect the general public from exposure. There were no barriers to communication. Written consent was obtained. The patient then received a capsule containing the radiopharmaceutical. The patient will follow-up with the referring physician. RADIOPHARMACEUTICALS:  30.3 mCi I-131 sodium iodide orally IMPRESSION: Per oral administration of I-131 sodium iodide for the treatment of hyperthyroidism. Electronically Signed   By: Richardean Sale M.D.   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 Pischke maintain her Calcium-Vitamin D, COD LIVER OIL PO, vitamin C, furosemide, potassium chloride SA,  simvastatin, lisinopril, carvedilol, and meloxicam.  No orders of the defined types were placed in this encounter.     Follow-up: Return in about 4 months (around 03/03/2016) for Wellness Exam.  Walker Kehr, MD

## 2015-11-03 NOTE — Progress Notes (Signed)
Pre visit review using our clinic review tool, if applicable. No additional management support is needed unless otherwise documented below in the visit note. 

## 2015-11-03 NOTE — Assessment & Plan Note (Signed)
Re-start Zocor 1/2 tab if tolerated

## 2016-03-06 ENCOUNTER — Encounter: Payer: Self-pay | Admitting: Internal Medicine

## 2016-03-06 ENCOUNTER — Ambulatory Visit (INDEPENDENT_AMBULATORY_CARE_PROVIDER_SITE_OTHER): Payer: Medicare Other | Admitting: Internal Medicine

## 2016-03-06 VITALS — BP 130/80 | HR 56 | Ht 66.5 in | Wt 164.0 lb

## 2016-03-06 DIAGNOSIS — Z0000 Encounter for general adult medical examination without abnormal findings: Secondary | ICD-10-CM | POA: Diagnosis not present

## 2016-03-06 DIAGNOSIS — E785 Hyperlipidemia, unspecified: Secondary | ICD-10-CM | POA: Diagnosis not present

## 2016-03-06 DIAGNOSIS — I10 Essential (primary) hypertension: Secondary | ICD-10-CM | POA: Diagnosis not present

## 2016-03-06 MED ORDER — VITAMIN D 1000 UNITS PO TABS: 1000.0000 [IU] | ORAL_TABLET | Freq: Every day | ORAL | Status: DC

## 2016-03-06 MED ORDER — CARVEDILOL 25 MG PO TABS: 25.0000 mg | ORAL_TABLET | Freq: Two times a day (BID) | ORAL | Status: DC

## 2016-03-06 NOTE — Patient Instructions (Addendum)
Deborah Myers , Thank you for taking time to come for your Medicare Wellness Visit. I appreciate your ongoing commitment to your health goals. Please review the following plan we discussed and let me know if I can assist you in the future.   Mammogram due 03/2016   Will have vision exam scheduled   Will pick up meds per Dr. Alain Marion  Will go to water aerobics  Will fup on Advanced Directive [place] offers free advance directive forms, as well as assistance in completing the forms themselves. For assistance, contact the Spiritual Care Department at [phone], or the Clinical Social Work Department at [phone].   These are the goals we discussed: Goals    . Exercise 150 minutes per week (moderate activity)     Will start back doing water aerobics       This is a list of the screening recommended for you and due dates:  Health Maintenance  Topic Date Due  . DEXA scan (bone density measurement)  02/14/2003  . Flu Shot  06/19/2016  . Colon Cancer Screening  11/21/2016  . Tetanus Vaccine  03/25/2019  . Shingles Vaccine  Completed  . Pneumonia vaccines  Completed      Preventive Care for Adults, Female A healthy lifestyle and preventive care can promote health and wellness. Preventive health guidelines for women include the following key practices.  A routine yearly physical is a good way to check with your health care provider about your health and preventive screening. It is a chance to share any concerns and updates on your health and to receive a thorough exam.  Visit your dentist for a routine exam and preventive care every 6 months. Brush your teeth twice a day and floss once a day. Good oral hygiene prevents tooth decay and gum disease.  The frequency of eye exams is based on your age, health, family medical history, use of contact lenses, and other factors. Follow your health care provider's recommendations for frequency of eye exams.  Eat a healthy diet.  Foods like vegetables, fruits, whole grains, low-fat dairy products, and lean protein foods contain the nutrients you need without too many calories. Decrease your intake of foods high in solid fats, added sugars, and salt. Eat the right amount of calories for you.Get information about a proper diet from your health care provider, if necessary.  Regular physical exercise is one of the most important things you can do for your health. Most adults should get at least 150 minutes of moderate-intensity exercise (any activity that increases your heart rate and causes you to sweat) each week. In addition, most adults need muscle-strengthening exercises on 2 or more days a week.  Maintain a healthy weight. The body mass index (BMI) is a screening tool to identify possible weight problems. It provides an estimate of body fat based on height and weight. Your health care provider can find your BMI and can help you achieve or maintain a healthy weight.For adults 20 years and older:  A BMI below 18.5 is considered underweight.  A BMI of 18.5 to 24.9 is normal.  A BMI of 25 to 29.9 is considered overweight.  A BMI of 30 and above is considered obese.  Maintain normal blood lipids and cholesterol levels by exercising and minimizing your intake of saturated fat. Eat a balanced diet with plenty of fruit and vegetables. Blood tests for lipids and cholesterol should begin at age 2 and be repeated every 5 years. If your lipid or  cholesterol levels are high, you are over 50, or you are at high risk for heart disease, you may need your cholesterol levels checked more frequently.Ongoing high lipid and cholesterol levels should be treated with medicines if diet and exercise are not working.  If you smoke, find out from your health care provider how to quit. If you do not use tobacco, do not start.  Lung cancer screening is recommended for adults aged 61-80 years who are at high risk for developing lung cancer because  of a history of smoking. A yearly low-dose CT scan of the lungs is recommended for people who have at least a 30-pack-year history of smoking and are a current smoker or have quit within the past 15 years. A pack year of smoking is smoking an average of 1 pack of cigarettes a day for 1 year (for example: 1 pack a day for 30 years or 2 packs a day for 15 years). Yearly screening should continue until the smoker has stopped smoking for at least 15 years. Yearly screening should be stopped for people who develop a health problem that would prevent them from having lung cancer treatment.  If you are pregnant, do not drink alcohol. If you are breastfeeding, be very cautious about drinking alcohol. If you are not pregnant and choose to drink alcohol, do not have more than 1 drink per day. One drink is considered to be 12 ounces (355 mL) of beer, 5 ounces (148 mL) of wine, or 1.5 ounces (44 mL) of liquor.  Avoid use of street drugs. Do not share needles with anyone. Ask for help if you need support or instructions about stopping the use of drugs.  High blood pressure causes heart disease and increases the risk of stroke. Your blood pressure should be checked at least every 1 to 2 years. Ongoing high blood pressure should be treated with medicines if weight loss and exercise do not work.  If you are 7-77 years old, ask your health care provider if you should take aspirin to prevent strokes.  Diabetes screening is done by taking a blood sample to check your blood glucose level after you have not eaten for a certain period of time (fasting). If you are not overweight and you do not have risk factors for diabetes, you should be screened once every 3 years starting at age 37. If you are overweight or obese and you are 62-59 years of age, you should be screened for diabetes every year as part of your cardiovascular risk assessment.  Breast cancer screening is essential preventive care for women. You should practice  "breast self-awareness." This means understanding the normal appearance and feel of your breasts and may include breast self-examination. Any changes detected, no matter how small, should be reported to a health care provider. Women in their 21s and 30s should have a clinical breast exam (CBE) by a health care provider as part of a regular health exam every 1 to 3 years. After age 45, women should have a CBE every year. Starting at age 27, women should consider having a mammogram (breast X-ray test) every year. Women who have a family history of breast cancer should talk to their health care provider about genetic screening. Women at a high risk of breast cancer should talk to their health care providers about having an MRI and a mammogram every year.  Breast cancer gene (BRCA)-related cancer risk assessment is recommended for women who have family members with BRCA-related cancers. BRCA-related cancers  include breast, ovarian, tubal, and peritoneal cancers. Having family members with these cancers may be associated with an increased risk for harmful changes (mutations) in the breast cancer genes BRCA1 and BRCA2. Results of the assessment will determine the need for genetic counseling and BRCA1 and BRCA2 testing.  Your health care provider may recommend that you be screened regularly for cancer of the pelvic organs (ovaries, uterus, and vagina). This screening involves a pelvic examination, including checking for microscopic changes to the surface of your cervix (Pap test). You may be encouraged to have this screening done every 3 years, beginning at age 25.  For women ages 51-65, health care providers may recommend pelvic exams and Pap testing every 3 years, or they may recommend the Pap and pelvic exam, combined with testing for human papilloma virus (HPV), every 5 years. Some types of HPV increase your risk of cervical cancer. Testing for HPV may also be done on women of any age with unclear Pap test  results.  Other health care providers may not recommend any screening for nonpregnant women who are considered low risk for pelvic cancer and who do not have symptoms. Ask your health care provider if a screening pelvic exam is right for you.  If you have had past treatment for cervical cancer or a condition that could lead to cancer, you need Pap tests and screening for cancer for at least 20 years after your treatment. If Pap tests have been discontinued, your risk factors (such as having a new sexual partner) need to be reassessed to determine if screening should resume. Some women have medical problems that increase the chance of getting cervical cancer. In these cases, your health care provider may recommend more frequent screening and Pap tests.  Colorectal cancer can be detected and often prevented. Most routine colorectal cancer screening begins at the age of 41 years and continues through age 53 years. However, your health care provider may recommend screening at an earlier age if you have risk factors for colon cancer. On a yearly basis, your health care provider may provide home test kits to check for hidden blood in the stool. Use of a small camera at the end of a tube, to directly examine the colon (sigmoidoscopy or colonoscopy), can detect the earliest forms of colorectal cancer. Talk to your health care provider about this at age 98, when routine screening begins. Direct exam of the colon should be repeated every 5-10 years through age 63 years, unless early forms of precancerous polyps or small growths are found.  People who are at an increased risk for hepatitis B should be screened for this virus. You are considered at high risk for hepatitis B if:  You were born in a country where hepatitis B occurs often. Talk with your health care provider about which countries are considered high risk.  Your parents were born in a high-risk country and you have not received a shot to protect  against hepatitis B (hepatitis B vaccine).  You have HIV or AIDS.  You use needles to inject street drugs.  You live with, or have sex with, someone who has hepatitis B.  You get hemodialysis treatment.  You take certain medicines for conditions like cancer, organ transplantation, and autoimmune conditions.  Hepatitis C blood testing is recommended for all people born from [birth year] through 1965 and any individual with known risks for hepatitis C.  Practice safe sex. Use condoms and avoid high-risk sexual practices to reduce the spread of sexually  transmitted infections (STIs). STIs include gonorrhea, chlamydia, syphilis, trichomonas, herpes, HPV, and human immunodeficiency virus (HIV). Herpes, HIV, and HPV are viral illnesses that have no cure. They can result in disability, cancer, and death.  You should be screened for sexually transmitted illnesses (STIs) including gonorrhea and chlamydia if:  You are sexually active and are younger than 24 years.  You are older than 24 years and your health care provider tells you that you are at risk for this type of infection.  Your sexual activity has changed since you were last screened and you are at an increased risk for chlamydia or gonorrhea. Ask your health care provider if you are at risk.  If you are at risk of being infected with HIV, it is recommended that you take a prescription medicine daily to prevent HIV infection. This is called preexposure prophylaxis (PrEP). You are considered at risk if:  You are sexually active and do not regularly use condoms or know the HIV status of your partner(s).  You take drugs by injection.  You are sexually active with a partner who has HIV.  Talk with your health care provider about whether you are at high risk of being infected with HIV. If you choose to begin PrEP, you should first be tested for HIV. You should then be tested every 3 months for as long as you are taking PrEP.  Osteoporosis is a  disease in which the bones lose minerals and strength with aging. This can result in serious bone fractures or breaks. The risk of osteoporosis can be identified using a bone density scan. Women ages 55 years and over and women at risk for fractures or osteoporosis should discuss screening with their health care providers. Ask your health care provider whether you should take a calcium supplement or vitamin D to reduce the rate of osteoporosis.  Menopause can be associated with physical symptoms and risks. Hormone replacement therapy is available to decrease symptoms and risks. You should talk to your health care provider about whether hormone replacement therapy is right for you.  Use sunscreen. Apply sunscreen liberally and repeatedly throughout the day. You should seek shade when your shadow is shorter than you. Protect yourself by wearing long sleeves, pants, a wide-brimmed hat, and sunglasses year round, whenever you are outdoors.  Once a month, do a whole body skin exam, using a mirror to look at the skin on your back. Tell your health care provider of new moles, moles that have irregular borders, moles that are larger than a pencil eraser, or moles that have changed in shape or color.  Stay current with required vaccines (immunizations).  Influenza vaccine. All adults should be immunized every year.  Tetanus, diphtheria, and acellular pertussis (Td, Tdap) vaccine. Pregnant women should receive 1 dose of Tdap vaccine during each pregnancy. The dose should be obtained regardless of the length of time since the last dose. Immunization is preferred during the 27th-36th week of gestation. An adult who has not previously received Tdap or who does not know her vaccine status should receive 1 dose of Tdap. This initial dose should be followed by tetanus and diphtheria toxoids (Td) booster doses every 10 years. Adults with an unknown or incomplete history of completing a 3-dose immunization series with  Td-containing vaccines should begin or complete a primary immunization series including a Tdap dose. Adults should receive a Td booster every 10 years.  Varicella vaccine. An adult without evidence of immunity to varicella should receive 2 doses  or a second dose if she has previously received 1 dose. Pregnant females who do not have evidence of immunity should receive the first dose after pregnancy. This first dose should be obtained before leaving the health care facility. The second dose should be obtained 4-8 weeks after the first dose.  Human papillomavirus (HPV) vaccine. Females aged 13-26 years who have not received the vaccine previously should obtain the 3-dose series. The vaccine is not recommended for use in pregnant females. However, pregnancy testing is not needed before receiving a dose. If a female is found to be pregnant after receiving a dose, no treatment is needed. In that case, the remaining doses should be delayed until after the pregnancy. Immunization is recommended for any person with an immunocompromised condition through the age of 70 years if she did not get any or all doses earlier. During the 3-dose series, the second dose should be obtained 4-8 weeks after the first dose. The third dose should be obtained 24 weeks after the first dose and 16 weeks after the second dose.  Zoster vaccine. One dose is recommended for adults aged 82 years or older unless certain conditions are present.  Measles, mumps, and rubella (MMR) vaccine. Adults born before [birth year] generally are considered immune to measles and mumps. Adults born in [birth year] or later should have 1 or more doses of MMR vaccine unless there is a contraindication to the vaccine or there is laboratory evidence of immunity to each of the three diseases. A routine second dose of MMR vaccine should be obtained at least 28 days after the first dose for students attending postsecondary schools, health care workers, or international travelers.  People who received inactivated measles vaccine or an unknown type of measles vaccine during 1963-1967 should receive 2 doses of MMR vaccine. People who received inactivated mumps vaccine or an unknown type of mumps vaccine before 1979 and are at high risk for mumps infection should consider immunization with 2 doses of MMR vaccine. For females of childbearing age, rubella immunity should be determined. If there is no evidence of immunity, females who are not pregnant should be vaccinated. If there is no evidence of immunity, females who are pregnant should delay immunization until after pregnancy. Unvaccinated health care workers born before [birth year] who lack laboratory evidence of measles, mumps, or rubella immunity or laboratory confirmation of disease should consider measles and mumps immunization with 2 doses of MMR vaccine or rubella immunization with 1 dose of MMR vaccine.  Pneumococcal 13-valent conjugate (PCV13) vaccine. When indicated, a person who is uncertain of his immunization history and has no record of immunization should receive the PCV13 vaccine. All adults 58 years of age and older should receive this vaccine. An adult aged 77 years or older who has certain medical conditions and has not been previously immunized should receive 1 dose of PCV13 vaccine. This PCV13 should be followed with a dose of pneumococcal polysaccharide (PPSV23) vaccine. Adults who are at high risk for pneumococcal disease should obtain the PPSV23 vaccine at least 8 weeks after the dose of PCV13 vaccine. Adults older than 77 years of age who have normal immune system function should obtain the PPSV23 vaccine dose at least 1 year after the dose of PCV13 vaccine.  Pneumococcal polysaccharide (PPSV23) vaccine. When PCV13 is also indicated, PCV13 should be obtained first. All adults aged 32 years and older should be immunized. An adult younger than age 65 years who has certain medical conditions should be immunized. Any person  who  resides in a nursing home or long-term care facility should be immunized. An adult smoker should be immunized. People with an immunocompromised condition and certain other conditions should receive both PCV13 and PPSV23 vaccines. People with human immunodeficiency virus (HIV) infection should be immunized as soon as possible after diagnosis. Immunization during chemotherapy or radiation therapy should be avoided. Routine use of PPSV23 vaccine is not recommended for American Indians, [place] Natives, or people younger than 65 years unless there are medical conditions that require PPSV23 vaccine. When indicated, people who have unknown immunization and have no record of immunization should receive PPSV23 vaccine. One-time revaccination 5 years after the first dose of PPSV23 is recommended for people aged 19-64 years who have chronic kidney failure, nephrotic syndrome, asplenia, or immunocompromised conditions. People who received 1-2 doses of PPSV23 before age 94 years should receive another dose of PPSV23 vaccine at age 41 years or later if at least 5 years have passed since the previous dose. Doses of PPSV23 are not needed for people immunized with PPSV23 at or after age 51 years.  Meningococcal vaccine. Adults with asplenia or persistent complement component deficiencies should receive 2 doses of quadrivalent meningococcal conjugate (MenACWY-D) vaccine. The doses should be obtained at least 2 months apart. Microbiologists working with certain meningococcal bacteria, [place] [employer] recruits, people at risk during an outbreak, and people who travel to or live in countries with a high rate of meningitis should be immunized. A first-year college student up through age 66 years who is living in a residence hall should receive a dose if she did not receive a dose on or after her 16th birthday. Adults who have certain high-risk conditions should receive one or more doses of vaccine.  Hepatitis A vaccine. Adults who wish  to be protected from this disease, have certain high-risk conditions, work with hepatitis A-infected animals, work in hepatitis A research labs, or travel to or work in countries with a high rate of hepatitis A should be immunized. Adults who were previously unvaccinated and who anticipate close contact with an international adoptee during the first 60 days after arrival in the [place] from a country with a high rate of hepatitis A should be immunized.  Hepatitis B vaccine. Adults who wish to be protected from this disease, have certain high-risk conditions, may be exposed to blood or other infectious body fluids, are household contacts or sex partners of hepatitis B positive people, are clients or workers in certain care facilities, or travel to or work in countries with a high rate of hepatitis B should be immunized.  Haemophilus influenzae type b (Hib) vaccine. A previously unvaccinated person with asplenia or sickle cell disease or having a scheduled splenectomy should receive 1 dose of Hib vaccine. Regardless of previous immunization, a recipient of a hematopoietic stem cell transplant should receive a 3-dose series 6-12 months after her successful transplant. Hib vaccine is not recommended for adults with HIV infection. Preventive Services / Frequency Ages 37 to 91 years  Blood pressure check.** / Every 3-5 years.  Lipid and cholesterol check.** / Every 5 years beginning at age 5.  Clinical breast exam.** / Every 3 years for women in their 35s and 6s.  BRCA-related cancer risk assessment.** / For women who have family members with a BRCA-related cancer (breast, ovarian, tubal, or peritoneal cancers).  Pap test.** / Every 2 years from ages 42 through 95. Every 3 years starting at age 69 through age 64 or 107 with a history of 3 consecutive  normal Pap tests.  HPV screening.** / Every 3 years from ages 57 through ages 63 to 5 with a history of 3 consecutive normal Pap tests.  Hepatitis  C blood test.** / For any individual with known risks for hepatitis C.  Skin self-exam. / Monthly.  Influenza vaccine. / Every year.  Tetanus, diphtheria, and acellular pertussis (Tdap, Td) vaccine.** / Consult your health care provider. Pregnant women should receive 1 dose of Tdap vaccine during each pregnancy. 1 dose of Td every 10 years.  Varicella vaccine.** / Consult your health care provider. Pregnant females who do not have evidence of immunity should receive the first dose after pregnancy.  HPV vaccine. / 3 doses over 6 months, if 34 and younger. The vaccine is not recommended for use in pregnant females. However, pregnancy testing is not needed before receiving a dose.  Measles, mumps, rubella (MMR) vaccine.** / You need at least 1 dose of MMR if you were born in 1957 or later. You may also need a 2nd dose. For females of childbearing age, rubella immunity should be determined. If there is no evidence of immunity, females who are not pregnant should be vaccinated. If there is no evidence of immunity, females who are pregnant should delay immunization until after pregnancy.  Pneumococcal 13-valent conjugate (PCV13) vaccine.** / Consult your health care provider.  Pneumococcal polysaccharide (PPSV23) vaccine.** / 1 to 2 doses if you smoke cigarettes or if you have certain conditions.  Meningococcal vaccine.** / 1 dose if you are age 75 to 70 years and a Market researcher living in a residence hall, or have one of several medical conditions, you need to get vaccinated against meningococcal disease. You may also need additional booster doses.  Hepatitis A vaccine.** / Consult your health care provider.  Hepatitis B vaccine.** / Consult your health care provider.  Haemophilus influenzae type b (Hib) vaccine.** / Consult your health care provider. Ages 62 to 24 years  Blood pressure check.** / Every year.  Lipid and cholesterol check.** / Every 5 years beginning at age 59  years.  Lung cancer screening. / Every year if you are aged 5-80 years and have a 30-pack-year history of smoking and currently smoke or have quit within the past 15 years. Yearly screening is stopped once you have quit smoking for at least 15 years or develop a health problem that would prevent you from having lung cancer treatment.  Clinical breast exam.** / Every year after age 31 years.  BRCA-related cancer risk assessment.** / For women who have family members with a BRCA-related cancer (breast, ovarian, tubal, or peritoneal cancers).  Mammogram.** / Every year beginning at age 71 years and continuing for as long as you are in good health. Consult with your health care provider.  Pap test.** / Every 3 years starting at age 64 years through age 21 or 49 years with a history of 3 consecutive normal Pap tests.  HPV screening.** / Every 3 years from ages 12 years through ages 69 to 65 years with a history of 3 consecutive normal Pap tests.  Fecal occult blood test (FOBT) of stool. / Every year beginning at age 11 years and continuing until age 27 years. You may not need to do this test if you get a colonoscopy every 10 years.  Flexible sigmoidoscopy or colonoscopy.** / Every 5 years for a flexible sigmoidoscopy or every 10 years for a colonoscopy beginning at age 89 years and continuing until age 3 years.  Hepatitis C  blood test.** / For all people born from [birth year] through 1963 and any individual with known risks for hepatitis C.  Skin self-exam. / Monthly.  Influenza vaccine. / Every year.  Tetanus, diphtheria, and acellular pertussis (Tdap/Td) vaccine.** / Consult your health care provider. Pregnant women should receive 1 dose of Tdap vaccine during each pregnancy. 1 dose of Td every 10 years.  Varicella vaccine.** / Consult your health care provider. Pregnant females who do not have evidence of immunity should receive the first dose after pregnancy.  Zoster vaccine.** / 1 dose for  adults aged 95 years or older.  Measles, mumps, rubella (MMR) vaccine.** / You need at least 1 dose of MMR if you were born in 1957 or later. You may also need a second dose. For females of childbearing age, rubella immunity should be determined. If there is no evidence of immunity, females who are not pregnant should be vaccinated. If there is no evidence of immunity, females who are pregnant should delay immunization until after pregnancy.  Pneumococcal 13-valent conjugate (PCV13) vaccine.** / Consult your health care provider.  Pneumococcal polysaccharide (PPSV23) vaccine.** / 1 to 2 doses if you smoke cigarettes or if you have certain conditions.  Meningococcal vaccine.** / Consult your health care provider.  Hepatitis A vaccine.** / Consult your health care provider.  Hepatitis B vaccine.** / Consult your health care provider.  Haemophilus influenzae type b (Hib) vaccine.** / Consult your health care provider. Ages 79 years and over  Blood pressure check.** / Every year.  Lipid and cholesterol check.** / Every 5 years beginning at age 63 years.  Lung cancer screening. / Every year if you are aged 6-80 years and have a 30-pack-year history of smoking and currently smoke or have quit within the past 15 years. Yearly screening is stopped once you have quit smoking for at least 15 years or develop a health problem that would prevent you from having lung cancer treatment.  Clinical breast exam.** / Every year after age 49 years.  BRCA-related cancer risk assessment.** / For women who have family members with a BRCA-related cancer (breast, ovarian, tubal, or peritoneal cancers).  Mammogram.** / Every year beginning at age 40 years and continuing for as long as you are in good health. Consult with your health care provider.  Pap test.** / Every 3 years starting at age 26 years through age 22 or 7 years with 3 consecutive normal Pap tests. Testing can be stopped between 65 and 70 years  with 3 consecutive normal Pap tests and no abnormal Pap or HPV tests in the past 10 years.  HPV screening.** / Every 3 years from ages 23 years through ages 104 or 59 years with a history of 3 consecutive normal Pap tests. Testing can be stopped between 65 and 70 years with 3 consecutive normal Pap tests and no abnormal Pap or HPV tests in the past 10 years.  Fecal occult blood test (FOBT) of stool. / Every year beginning at age 22 years and continuing until age 66 years. You may not need to do this test if you get a colonoscopy every 10 years.  Flexible sigmoidoscopy or colonoscopy.** / Every 5 years for a flexible sigmoidoscopy or every 10 years for a colonoscopy beginning at age 28 years and continuing until age 73 years.  Hepatitis C blood test.** / For all people born from [birth year] through 1965 and any individual with known risks for hepatitis C.  Osteoporosis screening.** / A one-time screening for women  ages 58 years and over and women at risk for fractures or osteoporosis.  Skin self-exam. / Monthly.  Influenza vaccine. / Every year.  Tetanus, diphtheria, and acellular pertussis (Tdap/Td) vaccine.** / 1 dose of Td every 10 years.  Varicella vaccine.** / Consult your health care provider.  Zoster vaccine.** / 1 dose for adults aged 39 years or older.  Pneumococcal 13-valent conjugate (PCV13) vaccine.** / Consult your health care provider.  Pneumococcal polysaccharide (PPSV23) vaccine.** / 1 dose for all adults aged 55 years and older.  Meningococcal vaccine.** / Consult your health care provider.  Hepatitis A vaccine.** / Consult your health care provider.  Hepatitis B vaccine.** / Consult your health care provider.  Haemophilus influenzae type b (Hib) vaccine.** / Consult your health care provider. ** Family history and personal history of risk and conditions may change your health care provider's recommendations.   This information is not intended to replace advice given to you  by your health care provider. Make sure you discuss any questions you have with your health care provider.   Document Released: 01/01/2002 Document Revised: 11/26/2014 Document Reviewed: 04/02/2011 Elsevier Interactive Patient Education [employer].

## 2016-03-06 NOTE — Assessment & Plan Note (Signed)
  Here for medicare wellness/physical  Diet: heart healthy  Physical activity: not sedentary  Depression/mood screen: negative  Hearing: intact to whispered voice  Visual acuity: grossly normal, performs annual eye exam  ADLs: capable  Fall risk: low to none  Home safety: good  Cognitive evaluation: intact to orientation, naming, recall and repetition  EOL planning: adv directives, full code/ I agree  I have personally reviewed and have noted  1. The patient's medical, surgical and social history  2. Their use of alcohol, tobacco or illicit drugs  3. Their current medications and supplements  4. The patient's functional ability including ADL's, fall risks, home safety risks and hearing or visual impairment.  5. Diet and physical activities  6. Evidence for depression or mood disorders 7. The roster of all physicians providing medical care to patient - is listed in the Snapshot section of the chart and reviewed today.    Today patient counseled on age appropriate routine health concerns for screening and prevention, each reviewed and up to date or declined. Immunizations reviewed and up to date or declined. Labs ordered and reviewed. Risk factors for depression reviewed and negative. Hearing function and visual acuity are intact. ADLs screened and addressed as needed. Functional ability and level of safety reviewed and appropriate. Education, counseling and referrals performed based on assessed risks today. Patient provided with a copy of personalized plan for preventive services.  Colonoscopy Jan '13 Immunizations: Tetanus May '10; Pneumonia vaccine May '12; Shingles July '14, Prevnar Feb '15. [place]

## 2016-03-06 NOTE — Assessment & Plan Note (Signed)
Pt stopped Lisinopril, Lasix Start back on Coreg Risks associated with treatment noncompliance were discussed. Compliance was encouraged.

## 2016-03-06 NOTE — Progress Notes (Signed)
Subjective:   Deborah Myers is a 80 y.o. female who presents for Medicare Annual (Subsequent) preventive examination.  Review of Systems:  HRA assessment completed during visit; Deborah Myers, Deborah Myers  The Patient was informed that this wellness visit is to identify risk and educate on how to reduce risk for increase disease through lifestyle changes.   ROS deferred to CPE exam with physician today Describes health has good   Medical and family hx Mother had CAD; DM; HD; Father had CAD; DM; HD;  Sister DM Brother DM  Medical issues  Hyperlipidemia 10/2015; cho 243; Trig 74; HDL 57; LDL 171 (glucose 93)  HTN; is stable today   Osteo  2009 test -0.1 / states she prefers to decline Dexa and may take next year; Agrees to start water aerobics which is weight bearing; as well as taking Vit D    Tobacco: never smoked ETOH: 6.2 oz per week; (11 drinks)  States she does not drink any ETOH at present   Medication review/ reviewed 2 meds;  Coreg and Vit D and will get these filled   BMI: 26  Diet; eat 3 meals a day; Has breakfast; cup of coffee; have boiled egg and toast;  Lunch; has a green salad; goes to [employer]; 6" sandwich  May have baked potato with salad Will not eat dinner if she ate a late lunch Vegetables; salad; green beans; likes broccoli   Exercise; has 2 weights at home; uses periodically  Used to go the [facility]; did enjoy water aerobics;  Agrees to start back 3 days a week;   SAFETY; lives alone  One level; good neighbors; been there a long time   Safety reviewed for the home;  Removal of clutter clearing paths through the home, residential neighborhood;  Neighbors son watches out for the older adults in the community   Bathroom safety; does shower most mornings; gets in tub;  Has to be careful getting in and out of tub but no issues at present Bar over the bathtub; did educated on shower transfer bench for safety.  Usually has her sister to come by when she  showers so someone is in the home.  Does take her phone with her to bathroom  Community safety; yes Smoke detectors yes Firearms safety / to keep in a safe place  Driving accidents and seatbelt/ no Gets her own groceries; has 2 other sisters and they get together often and go out to eat or shop Has niece whom she is close too.  Sun protection/ not out very much.  Stressors 1-5; is a one; no stress   Depression; no; have to get up and get going   Fall assessment: no falls / is careful to put on shoes when she is up;  Does not walk around in bedroom slippers  Gait assessment/ Gait appears normal today  Mobilization and Functional losses in the last year./ about the same  Sleep patterns; sleeps well and gets a lot of rest   Urinary or fecal incontinence; urinates a lot;   Lifeline: http://www.lifelinesys.com/content/home; 305-666-4483 [phone] / may consider getting a lifeline;   Counseling: Colonoscopy; 11/2011 it is good; does not need to repeat EKG: 09/2012 Mammogram 03/2014/ will have Mammogram 03/2016  Dexa 03/2008 -0.1 only outlier /  Is taking Vit d and will start water aerobics which is weight bearing and will postpone this year PAP 03/99 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  Shirpiro  Immunizations up to date  Scientist, physiological; Educated and will take a copy   Health advice or referrals Start water aerobics;  Make apt for eye check up;  Continue her Vit D May plan screen for Dexa next year;    Current Care Team reviewed and updated    Cardiac Risk Factors include: advanced age (>72men, >3 women);hypertension;sedentary lifestyle;dyslipidemia     Objective:     Vitals: BP 130/80 mmHg  Pulse 56  Ht 5' 6.5" (1.689 m)  Wt 164 lb (74.39 kg)  BMI 26.08 kg/m2  SpO2 98%  Body mass index is 26.08 kg/(m^2).   Tobacco History  Smoking status  . Never Smoker   Smokeless tobacco  . Never Used     Counseling  given: Not Answered  No ETOH use; never smoked   Past Medical History  Diagnosis Date  . Hyperlipidemia   . HTN (hypertension)   . Cecum mass 2008    Benign   . Arthritis   . Cataract   . Osteoporosis     no per pt   Past Surgical History  Procedure Laterality Date  . Colectomy  2008    Benign mass - laparoscopic right  . Colonoscopy     Family History  Problem Relation Age of Onset  . Coronary artery disease Mother   . Diabetes Mother   . Heart disease Mother     CHF  . Coronary artery disease Father   . Diabetes Father   . Heart disease Father   . Diabetes Sister   . Diabetes Brother   . Cancer Neg Hx   . COPD Neg Hx   . Colon cancer Neg Hx   . Esophageal cancer Neg Hx   . Stomach cancer Neg Hx   . Rectal cancer Neg Hx    History  Sexual Activity  . Sexual Activity: Not Currently    Outpatient Encounter Prescriptions as of 03/06/2016  Medication Sig  . carvedilol (COREG) 25 MG tablet Take 1 tablet (25 mg total) by mouth 2 (two) times daily with a meal.  . [DISCONTINUED] Ascorbic Acid (VITAMIN C) 1000 MG tablet Take 1,000 mg by mouth daily.    . [DISCONTINUED] Calcium Carbonate-Vitamin D (CALCIUM-VITAMIN D) 600-200 MG-UNIT CAPS Take 1 tablet by mouth daily.   . [DISCONTINUED] carvedilol (COREG) 25 MG tablet Take 1 tablet (25 mg total) by mouth 2 (two) times daily with a meal.  . [DISCONTINUED] COD LIVER OIL PO Take by mouth daily.    . [DISCONTINUED] furosemide (LASIX) 40 MG tablet Take 1 tablet (40 mg total) by mouth daily as needed.  . [DISCONTINUED] lisinopril (PRINIVIL,ZESTRIL) 40 MG tablet Take 1 tablet (40 mg total) by mouth daily.  . [DISCONTINUED] meloxicam (MOBIC) 7.5 MG tablet Take 1 tablet (7.5 mg total) by mouth daily as needed for pain.  . [DISCONTINUED] potassium chloride SA (K-DUR,KLOR-CON) 20 MEQ tablet take 1 tablet by mouth once daily  . [DISCONTINUED] simvastatin (ZOCOR) 40 MG tablet take 1 tablet by mouth every AT BEDTIME  . cholecalciferol  (VITAMIN D) 1000 units tablet Take 1 tablet (1,000 Units total) by mouth daily.   No facility-administered encounter medications on file as of 03/06/2016.    Activities of Daily Living In your present state of health, do you have any difficulty performing the following activities: 03/06/2016  Hearing? N  Vision? N  Difficulty concentrating or making decisions? N  Walking or climbing stairs? N  Dressing or bathing? N  Doing errands,  shopping? N  Preparing Food and eating ? N  Using the Toilet? N  In the past six months, have you accidently leaked urine? N  Do you have problems with loss of bowel control? N  Managing your Medications? N  Managing your Finances? N  Housekeeping or managing your Housekeeping? N    Patient Care Team: Cassandria Anger, MD as PCP - General (Internal Medicine) Rutherford Guys, MD (Ophthalmology) Irene Shipper, MD (Gastroenterology)    Assessment:     Exercise Activities and Dietary recommendations Current Exercise Habits: Structured exercise class, Time (Minutes): 60, Frequency (Times/Week): 2 (will start water aerobic ), Weekly Exercise (Minutes/Week): 120  Goals    . Exercise 150 minutes per week (moderate activity)     Will start back doing water aerobics      Fall Risk Fall Risk  03/06/2016 03/06/2016 02/09/2015  Falls in the past year? No No No   Depression Screen PHQ 2/9 Scores 03/06/2016 03/06/2016 02/09/2015  PHQ - 2 Score 0 1 0     Cognitive Testing MMSE - Mini Mental State Exam 03/06/2016  Not completed: (No Data)   Ad8 score 0   Immunization History  Administered Date(s) Administered  . Influenza,inj,Quad PF,36+ Mos 11/03/2015  . Pneumococcal Conjugate-13 01/04/2014  . Pneumococcal Polysaccharide-23 03/24/2009, 04/17/2011  . Td 03/24/2009  . Zoster 06/11/2013   Screening Tests Health Maintenance  Topic Date Due  . DEXA SCAN  02/14/2003  . INFLUENZA VACCINE  06/19/2016  . COLONOSCOPY  11/21/2016  . TETANUS/TDAP  03/25/2019    . ZOSTAVAX  Completed  . PNA vac Low Risk Adult  Completed      Plan:   Mammogram due 03/2016   Will have vision exam scheduled   Will have labs drawn soon; Will remain NPO except for water or black coffee;   Will go back  to water aerobics  Will fup on Advanced Directive [place] offers free advance directive forms, as well as assistance in completing the forms themselves. For assistance, contact the Spiritual Care Department at [phone], or the Clinical Social Work Department at [phone].  During the course of the visit the patient was educated and counseled about the following appropriate screening and preventive services:   Vaccines to include Pneumoccal, Influenza, Hepatitis B, Td, Zostavax, HCV/ up to date  Electrocardiogram 09/2012  Cardiovascular Disease/ neg;   Colorectal cancer screening; 11/2011/ states she will not need to repeat  Report stated repeat in 5 years; was noted and due  11/2016  Bone density screening/ declined this year; but did state she would do water aerobics and take Vit d  Diabetes screening/ neg  Glaucoma screening/ eye exam to be scheduled this year  Mammography/ due 03/2016  Nutrition counseling / educated;   Patient Instructions (the written plan) was given to the patient.   Wynetta Fines, RN  03/06/2016

## 2016-03-06 NOTE — Progress Notes (Signed)
Subjective:  Patient ID: Deborah Myers, female    DOB: [birth year]  Age: 80 y.o. MRN: [id number]  CC: No chief complaint on file.   HPI Deborah Myers presents for well exam. F/u HTN, dyslipidemia. Pt stopped all meds a long time ago - BP ok   Outpatient Prescriptions Prior to Visit  Medication Sig Dispense Refill  . Ascorbic Acid (VITAMIN C) 1000 MG tablet Take 1,000 mg by mouth daily.      . Calcium Carbonate-Vitamin D (CALCIUM-VITAMIN D) 600-200 MG-UNIT CAPS Take 1 tablet by mouth daily.     . carvedilol (COREG) 25 MG tablet Take 1 tablet (25 mg total) by mouth 2 (two) times daily with a meal. 180 tablet 3  . COD LIVER OIL PO Take by mouth daily.      . furosemide (LASIX) 40 MG tablet Take 1 tablet (40 mg total) by mouth daily as needed. 30 tablet 3  . lisinopril (PRINIVIL,ZESTRIL) 40 MG tablet Take 1 tablet (40 mg total) by mouth daily. 90 tablet 3  . meloxicam (MOBIC) 7.5 MG tablet Take 1 tablet (7.5 mg total) by mouth daily as needed for pain. 30 tablet 3  . potassium chloride SA (K-DUR,KLOR-CON) 20 MEQ tablet take 1 tablet by mouth once daily 30 tablet 5  . simvastatin (ZOCOR) 40 MG tablet take 1 tablet by mouth every AT BEDTIME 30 tablet 11   No facility-administered medications prior to visit.    ROS Review of Systems  Constitutional: Negative for chills, activity change, appetite change, fatigue and unexpected weight change.  HENT: Negative for congestion, mouth sores and sinus pressure.   Eyes: Negative for visual disturbance.  Respiratory: Negative for cough and chest tightness.   Gastrointestinal: Negative for nausea and abdominal pain.  Genitourinary: Negative for frequency, difficulty urinating and vaginal pain.  Musculoskeletal: Negative for back pain and gait problem.  Skin: Negative for pallor and rash.  Neurological: Negative for dizziness, tremors, weakness, numbness and headaches.  Psychiatric/Behavioral: Negative for confusion and sleep disturbance.      Objective:  BP 130/80 mmHg  Pulse 56  Wt 164 lb (74.39 kg)  SpO2 98%  BP Readings from Last 3 Encounters:  03/06/16 130/80  11/03/15 138/84  07/04/15 180/98    Wt Readings from Last 3 Encounters:  03/06/16 164 lb (74.39 kg)  11/03/15 165 lb (74.844 kg)  07/04/15 167 lb (75.751 kg)    Physical Exam  Constitutional: She appears well-developed. No distress.  HENT:  Head: Normocephalic.  Right Ear: External ear normal.  Left Ear: External ear normal.  Nose: Nose normal.  Mouth/Throat: Oropharynx is clear and moist.  Eyes: Conjunctivae are normal. Pupils are equal, round, and reactive to light. Right eye exhibits no discharge. Left eye exhibits no discharge.  Neck: Normal range of motion. Neck supple. No JVD present. No tracheal deviation present. No thyromegaly present.  Cardiovascular: Normal rate, regular rhythm and normal heart sounds.   Pulmonary/Chest: No stridor. No respiratory distress. She has no wheezes.  Abdominal: Soft. Bowel sounds are normal. She exhibits no distension and no mass. There is no tenderness. There is no rebound and no guarding.  Musculoskeletal: She exhibits no edema or tenderness.  Lymphadenopathy:    She has no cervical adenopathy.  Neurological: She displays normal reflexes. No cranial nerve deficit. She exhibits normal muscle tone. Coordination normal.  Skin: No rash noted. No erythema.  Psychiatric: She has a normal mood and affect. Her behavior is normal. Judgment and thought content normal.  Lab Results  Component Value Date   WBC 4.9 11/03/2015   HGB 14.3 11/03/2015   HCT 44.2 11/03/2015   PLT 271.0 11/03/2015   GLUCOSE 93 11/03/2015   CHOL 243* 11/03/2015   TRIG 74.0 11/03/2015   HDL 57.60 11/03/2015   LDLCALC 171* 11/03/2015   ALT 12 06/04/2013   ALT 12 06/04/2013   AST 15 06/04/2013   AST 15 06/04/2013   NA 142 11/03/2015   K 4.1 11/03/2015   CL 104 11/03/2015   CREATININE 0.83 11/03/2015   BUN 14 11/03/2015   CO2 32  11/03/2015   TSH 3.10 11/03/2015    Nm Rai Therapy For Hyperthyroidism  03/31/2015  CLINICAL DATA:  Hyperthyroidism.  Multinodular goiter. EXAM: RADIOACTIVE IODINE THERAPY FOR HYPERTHYROIDISM COMPARISON:  Ultrasound 03/31/2009. Nuclear medicine thyroid uptake and scan 03/17/2015. TECHNIQUE: Radioactive iodine prescribed by myself after reviewing the patient's prior studies yesterday. The risks and benefits of radioactive iodine therapy were discussed with the patient in detail by Dr. Carlis Abbott. Alternative therapies were also mentioned. Radiation safety was discussed with the patient, including how to protect the general public from exposure. There were no barriers to communication. Written consent was obtained. The patient then received a capsule containing the radiopharmaceutical. The patient will follow-up with the referring physician. RADIOPHARMACEUTICALS:  30.3 mCi I-131 sodium iodide orally IMPRESSION: Per oral administration of I-131 sodium iodide for the treatment of hyperthyroidism. Electronically Signed   By: Richardean Sale M.D.   On: 03/31/2015 13:35    Assessment & Plan:   There are no diagnoses linked to this encounter. I am having Deborah Myers maintain her Calcium-Vitamin D, COD LIVER OIL PO, vitamin C, furosemide, potassium chloride SA, simvastatin, lisinopril, carvedilol, and meloxicam.  No orders of the defined types were placed in this encounter.     Follow-up: No Follow-up on file.  Walker Kehr, MD

## 2016-03-06 NOTE — Assessment & Plan Note (Signed)
Pt declined statins 

## 2016-03-06 NOTE — Progress Notes (Signed)
Pre visit review using our clinic review tool, if applicable. No additional management support is needed unless otherwise documented below in the visit note. 

## 2016-09-04 ENCOUNTER — Ambulatory Visit: Payer: Medicare Other | Admitting: Internal Medicine

## 2016-09-11 ENCOUNTER — Ambulatory Visit (INDEPENDENT_AMBULATORY_CARE_PROVIDER_SITE_OTHER): Payer: Medicare Other | Admitting: Internal Medicine

## 2016-09-11 ENCOUNTER — Encounter: Payer: Self-pay | Admitting: Internal Medicine

## 2016-09-11 DIAGNOSIS — I10 Essential (primary) hypertension: Secondary | ICD-10-CM

## 2016-09-11 DIAGNOSIS — E785 Hyperlipidemia, unspecified: Secondary | ICD-10-CM | POA: Diagnosis not present

## 2016-09-11 DIAGNOSIS — Z23 Encounter for immunization: Secondary | ICD-10-CM | POA: Diagnosis not present

## 2016-09-11 DIAGNOSIS — M810 Age-related osteoporosis without current pathological fracture: Secondary | ICD-10-CM

## 2016-09-11 DIAGNOSIS — G4700 Insomnia, unspecified: Secondary | ICD-10-CM | POA: Insufficient documentation

## 2016-09-11 MED ORDER — VITAMIN D3 50 MCG (2000 UT) PO CAPS: 2000.0000 [IU] | ORAL_CAPSULE | Freq: Every day | ORAL | 3 refills | Status: DC

## 2016-09-11 NOTE — Assessment & Plan Note (Signed)
Re-start Vit D 

## 2016-09-11 NOTE — Assessment & Plan Note (Signed)
Not Taking

## 2016-09-11 NOTE — Patient Instructions (Signed)
Take a power nap 15-45 min after lunch Try Tylenol PM or Valerian root at night for sleep

## 2016-09-11 NOTE — Addendum Note (Signed)
Addended by: Cresenciano Lick on: 09/11/2016 03:32 PM   Modules accepted: Orders

## 2016-09-11 NOTE — Assessment & Plan Note (Signed)
BP ok at home Coreg

## 2016-09-11 NOTE — Progress Notes (Signed)
Pre visit review using our clinic review tool, if applicable. No additional management support is needed unless otherwise documented below in the visit note. 

## 2016-09-11 NOTE — Progress Notes (Signed)
Subjective:  Patient ID: Deborah Myers, female    DOB: 1936-09-26  Age: 80 y.o. MRN: [id number]  CC: No chief complaint on file.   HPI Deborah Myers presents for HTN C/o insomnia: x1-2 years, worse PT stated BP is nl at home...  Outpatient Medications Prior to Visit  Medication Sig Dispense Refill  . carvedilol (COREG) 25 MG tablet Take 1 tablet (25 mg total) by mouth 2 (two) times daily with a meal. 180 tablet 3  . cholecalciferol (VITAMIN D) 1000 units tablet Take 1 tablet (1,000 Units total) by mouth daily. (Patient not taking: Reported on 09/11/2016) 100 tablet 3   No facility-administered medications prior to visit.     ROS Review of Systems  Constitutional: Negative for activity change, appetite change, chills, fatigue and unexpected weight change.  HENT: Negative for congestion, mouth sores and sinus pressure.   Eyes: Negative for visual disturbance.  Respiratory: Negative for cough and chest tightness.   Gastrointestinal: Negative for abdominal pain and nausea.  Genitourinary: Negative for difficulty urinating, frequency and vaginal pain.  Musculoskeletal: Negative for back pain and gait problem.  Skin: Negative for pallor and rash.  Neurological: Negative for dizziness, tremors, weakness, numbness and headaches.  Psychiatric/Behavioral: Negative for confusion and sleep disturbance.    Objective:  BP (!) 180/100   Pulse 72   Temp 98.3 F (36.8 C) (Oral)   Resp 16   Wt 169 lb (76.7 kg)   BMI 26.87 kg/m   BP Readings from Last 3 Encounters:  09/11/16 (!) 180/100  03/06/16 130/80  11/03/15 138/84    Wt Readings from Last 3 Encounters:  09/11/16 169 lb (76.7 kg)  03/06/16 164 lb (74.4 kg)  11/03/15 165 lb (74.8 kg)    Physical Exam  Constitutional: She appears well-developed. No distress.  HENT:  Head: Normocephalic.  Right Ear: External ear normal.  Left Ear: External ear normal.  Nose: Nose normal.  Mouth/Throat: Oropharynx is clear and  moist.  Eyes: Conjunctivae are normal. Pupils are equal, round, and reactive to light. Right eye exhibits no discharge. Left eye exhibits no discharge.  Neck: Normal range of motion. Neck supple. No JVD present. No tracheal deviation present. No thyromegaly present.  Cardiovascular: Normal rate, regular rhythm and normal heart sounds.   Pulmonary/Chest: No stridor. No respiratory distress. She has no wheezes.  Abdominal: Soft. Bowel sounds are normal. She exhibits no distension and no mass. There is no tenderness. There is no rebound and no guarding.  Musculoskeletal: She exhibits no edema or tenderness.  Lymphadenopathy:    She has no cervical adenopathy.  Neurological: She displays normal reflexes. No cranial nerve deficit. She exhibits normal muscle tone. Coordination normal.  Skin: No rash noted. No erythema.  Psychiatric: She has a normal mood and affect. Her behavior is normal. Judgment and thought content normal.    Lab Results  Component Value Date   WBC 4.9 11/03/2015   HGB 14.3 11/03/2015   HCT 44.2 11/03/2015   PLT 271.0 11/03/2015   GLUCOSE 93 11/03/2015   CHOL 243 (H) 11/03/2015   TRIG 74.0 11/03/2015   HDL 57.60 11/03/2015   LDLCALC 171 (H) 11/03/2015   ALT 12 06/04/2013   ALT 12 06/04/2013   AST 15 06/04/2013   AST 15 06/04/2013   NA 142 11/03/2015   K 4.1 11/03/2015   CL 104 11/03/2015   CREATININE 0.83 11/03/2015   BUN 14 11/03/2015   CO2 32 11/03/2015   TSH 3.10 11/03/2015  Nm Rai Therapy For Hyperthyroidism  Result Date: 03/31/2015 CLINICAL DATA:  Hyperthyroidism.  Multinodular goiter. EXAM: RADIOACTIVE IODINE THERAPY FOR HYPERTHYROIDISM COMPARISON:  Ultrasound 03/31/2009. Nuclear medicine thyroid uptake and scan 03/17/2015. TECHNIQUE: Radioactive iodine prescribed by myself after reviewing the patient's prior studies yesterday. The risks and benefits of radioactive iodine therapy were discussed with the patient in detail by Dr. Carlis Abbott. Alternative  therapies were also mentioned. Radiation safety was discussed with the patient, including how to protect the general public from exposure. There were no barriers to communication. Written consent was obtained. The patient then received a capsule containing the radiopharmaceutical. The patient will follow-up with the referring physician. RADIOPHARMACEUTICALS:  30.3 mCi I-131 sodium iodide orally IMPRESSION: Per oral administration of I-131 sodium iodide for the treatment of hyperthyroidism. Electronically Signed   By: Richardean Sale M.D.   On: 03/31/2015 13:35    Assessment & Plan:   There are no diagnoses linked to this encounter. I am having Ms. Wolfgang maintain her carvedilol and cholecalciferol.  No orders of the defined types were placed in this encounter.    Follow-up: No Follow-up on file.  Walker Kehr, MD

## 2016-09-11 NOTE — Assessment & Plan Note (Signed)
Take a power nap 15-45 min after lunch Try Tylenol PM or Valerian root at night for sleep

## 2016-10-04 ENCOUNTER — Encounter: Payer: Self-pay | Admitting: Gastroenterology

## 2016-10-10 ENCOUNTER — Encounter: Payer: Self-pay | Admitting: Internal Medicine

## 2017-01-24 ENCOUNTER — Telehealth: Payer: Self-pay | Admitting: Internal Medicine

## 2017-01-24 NOTE — Telephone Encounter (Signed)
Attempted to call patient regarding awv. Patient did not answer. Will try to call patient again.

## 2017-05-17 ENCOUNTER — Ambulatory Visit: Payer: Medicare Other

## 2017-06-10 ENCOUNTER — Other Ambulatory Visit: Payer: Self-pay

## 2017-08-12 NOTE — Progress Notes (Signed)
Pre visit review using our clinic review tool, if applicable. No additional management support is needed unless otherwise documented below in the visit note. 

## 2017-08-12 NOTE — Progress Notes (Addendum)
Subjective:   Deborah Myers is a 81 y.o. female who presents for Medicare Annual (Subsequent) preventive examination.  Review of Systems:  No ROS.  Medicare Wellness Visit. Additional risk factors are reflected in the social history.  Cardiac Risk Factors include: advanced age (>22men, >5 women);hypertension Sleep patterns: feels rested on waking, gets up 1-2 times nightly to void and sleeps 6-7 hours nightly.    Home Safety/Smoke Alarms: Feels safe in home. Smoke alarms in place.  Living environment; residence and Firearm Safety: 1-story house/ trailer, no firearms.Lives alone, no needs for DME, good support system Seat Belt Safety/Bike Helmet: Wears seat belt.     Objective:     Vitals: BP (!) 156/85   Pulse 81   Resp 20   Ht 5\' 7"  (1.702 m)   Wt 167 lb (75.8 kg)   SpO2 95%   BMI 26.16 kg/m   Body mass index is 26.16 kg/m.   Tobacco History  Smoking Status  . Never Smoker  Smokeless Tobacco  . Never Used     Counseling given: Not Answered   Past Medical History:  Diagnosis Date  . Arthritis   . Cataract   . Cecum mass 2008   Benign   . HTN (hypertension)   . Hyperlipidemia   . Osteoporosis    no per pt   Past Surgical History:  Procedure Laterality Date  . COLECTOMY  2008   Benign mass - laparoscopic right  . COLONOSCOPY     Family History  Problem Relation Age of Onset  . Coronary artery disease Mother   . Diabetes Mother   . Heart disease Mother        CHF  . Coronary artery disease Father   . Diabetes Father   . Heart disease Father   . Diabetes Sister   . Diabetes Brother   . Cancer Neg Hx   . COPD Neg Hx   . Colon cancer Neg Hx   . Esophageal cancer Neg Hx   . Stomach cancer Neg Hx   . Rectal cancer Neg Hx    History  Sexual Activity  . Sexual activity: Not Currently    Outpatient Encounter Prescriptions as of 08/13/2017  Medication Sig  . Cholecalciferol (VITAMIN D3) 2000 units capsule Take 1 capsule (2,000 Units total)  by mouth daily.  . carvedilol (COREG) 25 MG tablet Take 1 tablet (25 mg total) by mouth 2 (two) times daily with a meal. (Patient not taking: Reported on 08/13/2017)   No facility-administered encounter medications on file as of 08/13/2017.     Activities of Daily Living In your present state of health, do you have any difficulty performing the following activities: 08/13/2017  Hearing? N  Vision? N  Walking or climbing stairs? N  Dressing or bathing? N  Doing errands, shopping? N  Preparing Food and eating ? N  Using the Toilet? N  In the past six months, have you accidently leaked urine? N  Do you have problems with loss of bowel control? N  Managing your Medications? N  Managing your Finances? N  Housekeeping or managing your Housekeeping? N  Some recent data might be hidden    Patient Care Team: Plotnikov, Evie Lacks, MD as PCP - General (Internal Medicine) Rutherford Guys, MD (Ophthalmology) Irene Shipper, MD (Gastroenterology)    Assessment:    Physical assessment deferred to PCP.  Exercise Activities and Dietary recommendations Current Exercise Habits: The patient does not participate in regular exercise at  present (chair exercise pamphlets provided), Exercise limited by: orthopedic condition(s)  Diet (meal preparation, eat out, water intake, caffeinated beverages, dairy products, fruits and vegetables): in general, a "healthy" diet  , well balanced   Reviewed heart healthy, encouraged patient to increase daily water intake.   Goals    . Exercise 150 minutes per week (moderate activity)          Will start back doing water aerobics    . Increase the amount of  water I drink, increase my physical activity          I will 3 bottles of water a day and place the water on the table so I will not forget. I will do chair exercises and walk in my neighborhood.      Fall Risk Fall Risk  08/13/2017 03/06/2016 03/06/2016 02/09/2015  Falls in the past year? Yes No No No  Number  falls in past yr: 1 - - -  Injury with Fall? No - - -  Follow up Falls prevention discussed;Education provided - - -   Depression Screen PHQ 2/9 Scores 08/13/2017 03/06/2016 03/06/2016 02/09/2015  PHQ - 2 Score 0 0 1 0  PHQ- 9 Score 0 - - -     Cognitive Function MMSE - Mini Mental State Exam 08/13/2017 03/06/2016  Not completed: - (No Data)  Orientation to time 5 -  Orientation to Place 5 -  Registration 3 -  Attention/ Calculation 4 -  Recall 1 -  Language- name 2 objects 2 -  Language- repeat 1 -  Language- follow 3 step command 3 -  Language- read & follow direction 1 -  Write a sentence 1 -  Copy design 1 -  Total score 27 -        Immunization History  Administered Date(s) Administered  . Influenza, High Dose Seasonal PF 09/11/2016  . Influenza,inj,Quad PF,6+ Mos 11/03/2015  . Pneumococcal Conjugate-13 01/04/2014  . Pneumococcal Polysaccharide-23 03/24/2009, 04/17/2011  . Td 03/24/2009  . Zoster 06/11/2013   Screening Tests Health Maintenance  Topic Date Due  . DEXA SCAN  02/14/2003  . INFLUENZA VACCINE  06/19/2017  . TETANUS/TDAP  03/25/2019  . PNA vac Low Risk Adult  Completed      Plan:    Continue doing brain stimulating activities (puzzles, reading, adult coloring books, staying active) to keep memory sharp.   Continue to eat heart healthy diet (full of fruits, vegetables, whole grains, lean protein, water--limit salt, fat, and sugar intake) and increase physical activity as tolerated.  I have personally reviewed and noted the following in the patient's chart:   . Medical and social history . Use of alcohol, tobacco or illicit drugs  . Current medications and supplements . Functional ability and status . Nutritional status . Physical activity . Advanced directives . List of other physicians . Vitals . Screenings to include cognitive, depression, and falls . Referrals and appointments  In addition, I have reviewed and discussed with patient  certain preventive protocols, quality metrics, and best practice recommendations. A written personalized care plan for preventive services as well as general preventive health recommendations were provided to patient.     Michiel Cowboy, RN  08/13/2017   Medical screening examination/treatment/procedure(s) were performed by non-physician practitioner and as supervising physician I was immediately available for consultation/collaboration. I agree with above. Lew Dawes, MD

## 2017-08-13 ENCOUNTER — Telehealth: Payer: Self-pay | Admitting: *Deleted

## 2017-08-13 ENCOUNTER — Ambulatory Visit (INDEPENDENT_AMBULATORY_CARE_PROVIDER_SITE_OTHER): Payer: Medicare Other | Admitting: *Deleted

## 2017-08-13 VITALS — BP 156/85 | HR 81 | Resp 20 | Ht 67.0 in | Wt 167.0 lb

## 2017-08-13 DIAGNOSIS — Z23 Encounter for immunization: Secondary | ICD-10-CM

## 2017-08-13 DIAGNOSIS — Z0000 Encounter for general adult medical examination without abnormal findings: Secondary | ICD-10-CM | POA: Diagnosis not present

## 2017-08-13 DIAGNOSIS — E2839 Other primary ovarian failure: Secondary | ICD-10-CM

## 2017-08-13 NOTE — Patient Instructions (Addendum)
Please get more vitamin D3 2000 units  Continue doing brain stimulating activities (puzzles, reading, adult coloring books, staying active) to keep memory sharp.   Continue to eat heart healthy diet (full of fruits, vegetables, whole grains, lean protein, water--limit salt, fat, and sugar intake) and increase physical activity as tolerated.   Deborah Myers , Thank you for taking time to come for your Medicare Wellness Visit. I appreciate your ongoing commitment to your health goals. Please review the following plan we discussed and let me know if I can assist you in the future.   These are the goals we discussed: Goals    . Exercise 150 minutes per week (moderate activity)          Will start back doing water aerobics    . Increase the amount of  water I drink, increase my physical activity          I will 3 bottles of water a day and place the water on the table so I will not forget. I will do chair exercises and walk in my neighborhood.       This is a list of the screening recommended for you and due dates:  Health Maintenance  Topic Date Due  . DEXA scan (bone density measurement)  02/14/2003  . Flu Shot  06/19/2017  . Tetanus Vaccine  03/25/2019  . Pneumonia vaccines  Completed     [employer]: http://www.lifelinesys.com/content/home; [phone]    Knee Exercises Ask your health care provider which exercises are safe for you. Do exercises exactly as told by your health care provider and adjust them as directed. It is normal to feel mild stretching, pulling, tightness, or discomfort as you do these exercises, but you should stop right away if you feel sudden pain or your pain gets worse.Do not begin these exercises until told by your health care provider. STRETCHING AND RANGE OF MOTION EXERCISES These exercises warm up your muscles and joints and improve the movement and flexibility of your knee. These exercises also help to relieve pain, numbness, and  tingling. Exercise A: Knee Extension, Prone 1. Lie on your abdomen on a bed. 2. Place your left / right knee just beyond the edge of the surface so your knee is not on the bed. You can put a towel under your left / right thigh just above your knee for comfort. 3. Relax your leg muscles and allow gravity to straighten your knee. You should feel a stretch behind your left / right knee. 4. Hold this position for __________ seconds. 5. Scoot up so your knee is supported between repetitions. Repeat __________ times. Complete this stretch __________ times a day. Exercise B: Knee Flexion, Active  1. Lie on your back with both knees straight. If this causes back discomfort, bend your left / right knee so your foot is flat on the floor. 2. Slowly slide your left / right heel back toward your buttocks until you feel a gentle stretch in the front of your knee or thigh. 3. Hold this position for __________ seconds. 4. Slowly slide your left / right heel back to the starting position. Repeat __________ times. Complete this exercise __________ times a day. Exercise C: Quadriceps, Prone  1. Lie on your abdomen on a firm surface, such as a bed or padded floor. 2. Bend your left / right knee and hold your ankle. If you cannot reach your ankle or pant leg, loop a belt around your foot and grab the belt instead.  3. Gently pull your heel toward your buttocks. Your knee should not slide out to the side. You should feel a stretch in the front of your thigh and knee. 4. Hold this position for __________ seconds. Repeat __________ times. Complete this stretch __________ times a day. Exercise D: Hamstring, Supine 1. Lie on your back. 2. Loop a belt or towel over the ball of your left / right foot. The ball of your foot is on the walking surface, right under your toes. 3. Straighten your left / right knee and slowly pull on the belt to raise your leg until you feel a gentle stretch behind your knee. ? Do not let  your left / right knee bend while you do this. ? Keep your other leg flat on the floor. 4. Hold this position for __________ seconds. Repeat __________ times. Complete this stretch __________ times a day. STRENGTHENING EXERCISES These exercises build strength and endurance in your knee. Endurance is the ability to use your muscles for a long time, even after they get tired. Exercise E: Quadriceps, Isometric  1. Lie on your back with your left / right leg extended and your other knee bent. Put a rolled towel or small pillow under your knee if told by your health care provider. 2. Slowly tense the muscles in the front of your left / right thigh. You should see your kneecap slide up toward your hip or see increased dimpling just above the knee. This motion will push the back of the knee toward the floor. 3. For __________ seconds, keep the muscle as tight as you can without increasing your pain. 4. Relax the muscles slowly and completely. Repeat __________ times. Complete this exercise __________ times a day. Exercise F: Straight Leg Raises - Quadriceps 1. Lie on your back with your left / right leg extended and your other knee bent. 2. Tense the muscles in the front of your left / right thigh. You should see your kneecap slide up or see increased dimpling just above the knee. Your thigh may even shake a bit. 3. Keep these muscles tight as you raise your leg 4-6 inches (10-15 cm) off the floor. Do not let your knee bend. 4. Hold this position for __________ seconds. 5. Keep these muscles tense as you lower your leg. 6. Relax your muscles slowly and completely after each repetition. Repeat __________ times. Complete this exercise __________ times a day. Exercise G: Hamstring, Isometric 1. Lie on your back on a firm surface. 2. Bend your left / right knee approximately __________ degrees. 3. Dig your left / right heel into the surface as if you are trying to pull it toward your buttocks. Tighten  the muscles in the back of your thighs to dig as hard as you can without increasing any pain. 4. Hold this position for __________ seconds. 5. Release the tension gradually and allow your muscles to relax completely for __________ seconds after each repetition. Repeat __________ times. Complete this exercise __________ times a day. Exercise H: Hamstring Curls  If told by your health care provider, do this exercise while wearing ankle weights. Begin with __________ weights. Then increase the weight by 1 lb (0.5 kg) increments. Do not wear ankle weights that are more than __________. 1. Lie on your abdomen with your legs straight. 2. Bend your left / right knee as far as you can without feeling pain. Keep your hips flat against the floor. 3. Hold this position for __________ seconds. 4. Slowly lower your  leg to the starting position.  Repeat __________ times. Complete this exercise __________ times a day. Exercise I: Squats (Quadriceps) 1. Stand in front of a table, with your feet and knees pointing straight ahead. You may rest your hands on the table for balance but not for support. 2. Slowly bend your knees and lower your hips like you are going to sit in a chair. ? Keep your weight over your heels, not over your toes. ? Keep your lower legs upright so they are parallel with the table legs. ? Do not let your hips go lower than your knees. ? Do not bend lower than told by your health care provider. ? If your knee pain increases, do not bend as low. 3. Hold the squat position for __________ seconds. 4. Slowly push with your legs to return to standing. Do not use your hands to pull yourself to standing. Repeat __________ times. Complete this exercise __________ times a day. Exercise J: Wall Slides (Quadriceps)  1. Lean your back against a smooth wall or door while you walk your feet out 18-24 inches (46-61 cm) from it. 2. Place your feet hip-width apart. 3. Slowly slide down the wall or door  until your knees bend __________ degrees. Keep your knees over your heels, not over your toes. Keep your knees in line with your hips. 4. Hold for __________ seconds. Repeat __________ times. Complete this exercise __________ times a day. Exercise K: Straight Leg Raises - Hip Abductors 1. Lie on your side with your left / right leg in the top position. Lie so your head, shoulder, knee, and hip line up. You may bend your bottom knee to help you keep your balance. 2. Roll your hips slightly forward so your hips are stacked directly over each other and your left / right knee is facing forward. 3. Leading with your heel, lift your top leg 4-6 inches (10-15 cm). You should feel the muscles in your outer hip lifting. ? Do not let your foot drift forward. ? Do not let your knee roll toward the ceiling. 4. Hold this position for __________ seconds. 5. Slowly return your leg to the starting position. 6. Let your muscles relax completely after each repetition. Repeat __________ times. Complete this exercise __________ times a day. Exercise L: Straight Leg Raises - Hip Extensors 1. Lie on your abdomen on a firm surface. You can put a pillow under your hips if that is more comfortable. 2. Tense the muscles in your buttocks and lift your left / right leg about 4-6 inches (10-15 cm). Keep your knee straight as you lift your leg. 3. Hold this position for __________ seconds. 4. Slowly lower your leg to the starting position. 5. Let your leg relax completely after each repetition. Repeat __________ times. Complete this exercise __________ times a day. This information is not intended to replace advice given to you by your health care provider. Make sure you discuss any questions you have with your health care provider. Document Released: 09/19/2005 Document Revised: 07/30/2016 Document Reviewed: 09/11/2015 Elsevier Interactive Patient Education  [employer].  It is important to avoid accidents which  may result in broken bones.  Here are a few ideas on how to make your home safer so you will be less likely to trip or fall.  6. Use nonskid mats or non slip strips in your shower or tub, on your bathroom floor and around sinks.  If you know that you have spilled water, wipe it up!  7. In the bathroom, it is important to have properly installed grab bars on the walls or on the edge of the tub.  Towel racks are NOT strong enough for you to hold onto or to pull on for support. 8. Stairs and hallways should have enough light.  Add lamps or night lights if you need ore light. 9. It is good to have handrails on both sides of the stairs if possible.  Always fix broken handrails right away. 10. It is important to see the edges of steps.  Paint the edges of outdoor steps white so you can see them better.  Put colored tape on the edge of inside steps. 11. Throw-rugs are dangerous because they can slide.  Removing the rugs is the best idea, but if they must stay, add adhesive carpet tape to prevent slipping. 12. Do not keep things on stairs or in the halls.  Remove small furniture that blocks the halls as it may cause you to trip.  Keep telephone and electrical cords out of the way where you walk. 13. Always were sturdy, rubber-soled shoes for good support.  Never wear just socks, especially on the stairs.  Socks may cause you to slip or fall.  Do not wear full-length housecoats as you can easily trip on the bottom.  14. Place the things you use the most on the shelves that are the easiest to reach.  If you use a stepstool, make sure it is in good condition.  If you feel unsteady, DO NOT climb, ask for help. If a health professional advises you to use a cane or walker, do not be ashamed.  These items can keep you from falling and breaking your bones. Protein Content in Foods Generally, most healthy people need around 50 grams of protein each day. Depending on your overall health, you may need more or less protein  in your diet. Talk to your health care provider or dietitian about how much protein you need. See the following list for the protein content of some common foods. High-protein foods High-protein foods contain 4 grams (4 g) or more of protein per serving. They include: Beef, ground sirloin (cooked) - 3 oz have 24 g of protein. Cheese (hard) - 1 oz has 7 g of protein. Chicken breast, boneless and skinless (cooked) - 3 oz have 13.4 g of protein. Cottage cheese - 1/2 cup has 13.4 g of protein. Egg - 1 egg has 6 g of protein. Fish, filet (cooked) - 1 oz has 6-7 g of protein. Garbanzo beans (canned or cooked) - 1/2 cup has 6-7 g of protein. Kidney beans (canned or cooked) - 1/2 cup has 6-7 g of protein. Lamb (cooked) - 3 oz has 24 g of protein. Milk - 1 cup (8 oz) has 8 g of protein. Nuts (peanuts, pistachios, almonds) - 1 oz has 6 g of protein. Peanut butter - 1 oz has 7-8 g of protein. Pork tenderloin (cooked) - 3 oz has 18.4 g of protein. Pumpkin seeds - 1 oz has 8.5 g of protein. Soybeans (roasted) - 1 oz has 8 g of protein. Soybeans (cooked) - 1/2 cup has 11 g of protein. Soy milk - 1 cup (8 oz) has 5-10 g of protein. Soy or vegetable patty - 1 patty has 11 g of protein. Sunflower seeds - 1 oz has 5.5 g of protein. Tofu (firm) - 1/2 cup has 20 g of protein. Tuna (canned in water) - 3 oz has 20 g of  protein. Yogurt - 6 oz has 8 g of protein.  Low-protein foods Low-protein foods contain 3 grams (3 g) or less of protein per serving. They include: Beets (raw or cooked) - 1/2 cup has 1.5 g of protein. Bran cereal - 1/2 cup has 2-3 g of protein. Bread - 1 slice has 2.5 g of protein. Broccoli (raw or cooked) - 1/2 cup has 2 g of protein. Collard greens (raw or cooked) - 1/2 cup has 2 g of protein. Corn (fresh or cooked) - 1/2 cup has 2 g of protein. Cream cheese - 1 oz has 2 g of protein. Creamer (half-and-half) - 1 oz has 1 g of protein. Flour tortilla - 1 tortilla has 2.5 g of  protein Frozen yogurt - 1/2 cup has 3 g of protein. Fruit or vegetable juice - 1/2 cup has 1 g of protein. Green beans (raw or cooked) - 1/2 cup has 1 g of protein. Green peas (canned) - 1/2 cup has 3.5 g of protein. Muffins - 1 small muffin (2 oz) has 3 g of protein. Oatmeal (cooked) - 1/2 cup has 3 g of protein. Potato (baked with skin) - 1 medium potato has 3 g of protein. Rice (cooked) - 1/2 cup has 2.5-3.5 g of protein. Sour cream - 1/2 cup has 2.5 g of protein. Spinach (cooked) - 1/2 cup has 3 g of protein. Squash (cooked) - 1/2 cup has 1.5 g of protein.  Actual amounts of protein may be different depending on processing. Talk with your health care provider or dietitian about what foods are recommended for you. This information is not intended to replace advice given to you by your health care provider. Make sure you discuss any questions you have with your health care provider. Document Released: 02/04/2016 Document Revised: 07/16/2016 Document Reviewed: 07/16/2016 Elsevier Interactive Patient Education  2018 [place]. Influenza Virus Vaccine injection What is this medicine? INFLUENZA VIRUS VACCINE (in floo EN zuh VAHY ruhs vak SEEN) helps to reduce the risk of getting influenza also known as the flu. The vaccine only helps protect you against some strains of the flu. This medicine may be used for other purposes; ask your health care provider or pharmacist if you have questions. COMMON BRAND NAME(S): Afluria, Agriflu, Alfuria, FLUAD, Fluarix, Fluarix Quadrivalent, Flublok, Flublok Quadrivalent, FLUCELVAX, Flulaval, Fluvirin, Fluzone, Fluzone High-Dose, Fluzone Intradermal What should I tell my health care provider before I take this medicine? They need to know if you have any of these conditions: -bleeding disorder like hemophilia -fever or infection -Guillain-Barre syndrome or other neurological problems -immune system problems -infection with the human immunodeficiency virus  (HIV) or AIDS -low blood platelet counts -multiple sclerosis -an unusual or allergic reaction to influenza virus vaccine, latex, other medicines, foods, dyes, or preservatives. Different brands of vaccines contain different allergens. Some may contain latex or eggs. Talk to your doctor about your allergies to make sure that you get the right vaccine. -pregnant or trying to get pregnant -breast-feeding How should I use this medicine? This vaccine is for injection into a muscle or under the skin. It is given by a health care professional. A copy of Vaccine Information Statements will be given before each vaccination. Read this sheet carefully each time. The sheet may change frequently. Talk to your healthcare provider to see which vaccines are right for you. Some vaccines should not be used in all age groups. Overdosage: If you think you have taken too much of this medicine contact a poison control  center or emergency room at once. NOTE: This medicine is only for you. Do not share this medicine with others. What if I miss a dose? This does not apply. What may interact with this medicine? -chemotherapy or radiation therapy -medicines that lower your immune system like etanercept, anakinra, infliximab, and adalimumab -medicines that treat or prevent blood clots like warfarin -phenytoin -steroid medicines like prednisone or cortisone -theophylline -vaccines This list may not describe all possible interactions. Give your health care provider a list of all the medicines, herbs, non-prescription drugs, or dietary supplements you use. Also tell them if you smoke, drink alcohol, or use illegal drugs. Some items may interact with your medicine. What should I watch for while using this medicine? Report any side effects that do not go away within 3 days to your doctor or health care professional. Call your health care provider if any unusual symptoms occur within 6 weeks of receiving this vaccine. You  may still catch the flu, but the illness is not usually as bad. You cannot get the flu from the vaccine. The vaccine will not protect against colds or other illnesses that may cause fever. The vaccine is needed every year. What side effects may I notice from receiving this medicine? Side effects that you should report to your doctor or health care professional as soon as possible: -allergic reactions like skin rash, itching or hives, swelling of the face, lips, or tongue Side effects that usually do not require medical attention (report to your doctor or health care professional if they continue or are bothersome): -fever -headache -muscle aches and pains -pain, tenderness, redness, or swelling at the injection site -tiredness This list may not describe all possible side effects. Call your doctor for medical advice about side effects. You may report side effects to FDA at 1-800-FDA-1088. Where should I keep my medicine? The vaccine will be given by a health care professional in a clinic, pharmacy, doctor's office, or other health care setting. You will not be given vaccine doses to store at home. NOTE: This sheet is a summary. It may not cover all possible information. If you have questions about this medicine, talk to your doctor, pharmacist, or health care provider.  2018 Elsevier/Gold Standard (2015-05-27 10:07:28) 15.

## 2017-08-13 NOTE — Telephone Encounter (Signed)
During AWV, patient stated that she needs carvedilol refilled.

## 2017-08-13 NOTE — Telephone Encounter (Signed)
Fill x 12 mo Thx 

## 2017-08-14 MED ORDER — CARVEDILOL 25 MG PO TABS: 25.0000 mg | ORAL_TABLET | Freq: Two times a day (BID) | ORAL | 12 refills | Status: DC

## 2017-08-14 NOTE — Telephone Encounter (Signed)
Attempted to call to inform patient that her carvedilol prescription has been refilled and discovered that her phone is not in service.

## 2017-08-15 ENCOUNTER — Inpatient Hospital Stay: Admission: RE | Admit: 2017-08-15 | Payer: Medicare Other | Source: Ambulatory Visit

## 2017-08-22 DIAGNOSIS — Z961 Presence of intraocular lens: Secondary | ICD-10-CM | POA: Diagnosis not present

## 2017-08-29 ENCOUNTER — Ambulatory Visit: Payer: Medicare Other | Admitting: Internal Medicine

## 2018-04-01 ENCOUNTER — Encounter: Payer: Self-pay | Admitting: Internal Medicine

## 2018-04-01 ENCOUNTER — Other Ambulatory Visit (INDEPENDENT_AMBULATORY_CARE_PROVIDER_SITE_OTHER): Payer: Medicare Other

## 2018-04-01 ENCOUNTER — Ambulatory Visit (INDEPENDENT_AMBULATORY_CARE_PROVIDER_SITE_OTHER): Payer: Medicare Other | Admitting: Internal Medicine

## 2018-04-01 ENCOUNTER — Other Ambulatory Visit: Payer: Self-pay | Admitting: Internal Medicine

## 2018-04-01 ENCOUNTER — Telehealth: Payer: Self-pay | Admitting: Internal Medicine

## 2018-04-01 VITALS — BP 148/92 | HR 63 | Temp 98.4°F | Ht 67.0 in | Wt 167.0 lb

## 2018-04-01 DIAGNOSIS — E559 Vitamin D deficiency, unspecified: Secondary | ICD-10-CM | POA: Diagnosis not present

## 2018-04-01 DIAGNOSIS — R202 Paresthesia of skin: Secondary | ICD-10-CM

## 2018-04-01 DIAGNOSIS — R413 Other amnesia: Secondary | ICD-10-CM | POA: Diagnosis not present

## 2018-04-01 DIAGNOSIS — F0280 Dementia in other diseases classified elsewhere without behavioral disturbance: Secondary | ICD-10-CM | POA: Insufficient documentation

## 2018-04-01 DIAGNOSIS — R29818 Other symptoms and signs involving the nervous system: Secondary | ICD-10-CM

## 2018-04-01 DIAGNOSIS — E0520 Thyrotoxicosis with toxic multinodular goiter without thyrotoxic crisis or storm: Secondary | ICD-10-CM | POA: Diagnosis not present

## 2018-04-01 DIAGNOSIS — I10 Essential (primary) hypertension: Secondary | ICD-10-CM | POA: Diagnosis not present

## 2018-04-01 DIAGNOSIS — G309 Alzheimer's disease, unspecified: Secondary | ICD-10-CM

## 2018-04-01 LAB — CBC WITH DIFFERENTIAL/PLATELET
BASOS PCT: 0.6 % (ref 0.0–3.0)
Basophils Absolute: 0 10*3/uL (ref 0.0–0.1)
Eosinophils Absolute: 0.3 10*3/uL (ref 0.0–0.7)
Eosinophils Relative: 6.7 % — ABNORMAL HIGH (ref 0.0–5.0)
HCT: 43.8 % (ref 36.0–46.0)
Hemoglobin: 14.5 g/dL (ref 12.0–15.0)
Lymphocytes Relative: 38.8 % (ref 12.0–46.0)
Lymphs Abs: 2 10*3/uL (ref 0.7–4.0)
MCHC: 33.1 g/dL (ref 30.0–36.0)
MCV: 84.5 fl (ref 78.0–100.0)
Monocytes Absolute: 0.6 10*3/uL (ref 0.1–1.0)
Monocytes Relative: 12.5 % — ABNORMAL HIGH (ref 3.0–12.0)
NEUTROS ABS: 2.1 10*3/uL (ref 1.4–7.7)
Neutrophils Relative %: 41.4 % — ABNORMAL LOW (ref 43.0–77.0)
PLATELETS: 322 10*3/uL (ref 150.0–400.0)
RBC: 5.18 Mil/uL — ABNORMAL HIGH (ref 3.87–5.11)
RDW: 12.8 % (ref 11.5–15.5)
WBC: 5.1 10*3/uL (ref 4.0–10.5)

## 2018-04-01 LAB — BASIC METABOLIC PANEL
BUN: 14 mg/dL (ref 6–23)
CHLORIDE: 103 meq/L (ref 96–112)
CO2: 30 mEq/L (ref 19–32)
Calcium: 9.5 mg/dL (ref 8.4–10.5)
Creatinine, Ser: 0.84 mg/dL (ref 0.40–1.20)
GFR: 83.87 mL/min (ref >60.00)
Glucose, Bld: 97 mg/dL (ref 70–99)
POTASSIUM: 3 meq/L — AB (ref 3.5–5.1)
Sodium: 140 mEq/L (ref 135–145)

## 2018-04-01 LAB — TSH: TSH: 4.55 u[IU]/mL — AB (ref 0.35–4.50)

## 2018-04-01 LAB — URINALYSIS, ROUTINE W REFLEX MICROSCOPIC
Bilirubin Urine: NEGATIVE
Ketones, ur: NEGATIVE
Nitrite: NEGATIVE
SPECIFIC GRAVITY, URINE: 1.015 (ref 1.000–1.030)
Total Protein, Urine: 30 — AB
URINE GLUCOSE: NEGATIVE
Urobilinogen, UA: 0.2 (ref 0.0–1.0)
pH: 6 (ref 5.0–8.0)

## 2018-04-01 LAB — HEPATIC FUNCTION PANEL
ALBUMIN: 4 g/dL (ref 3.5–5.2)
ALT: 6 U/L (ref 0–35)
AST: 11 U/L (ref 0–37)
Alkaline Phosphatase: 68 U/L (ref 39–117)
BILIRUBIN DIRECT: 0.1 mg/dL (ref 0.0–0.3)
TOTAL PROTEIN: 8 g/dL (ref 6.0–8.3)
Total Bilirubin: 0.7 mg/dL (ref 0.2–1.2)

## 2018-04-01 LAB — VITAMIN B12: Vitamin B-12: 536 pg/mL (ref 211–911)

## 2018-04-01 LAB — LIPID PANEL
CHOLESTEROL: 283 mg/dL — AB (ref 0–200)
HDL: 61.6 mg/dL (ref >39.00)
LDL CALC: 206 mg/dL — AB (ref 0–99)
NonHDL: 221.44
Total CHOL/HDL Ratio: 5
Triglycerides: 78 mg/dL (ref 0.0–149.0)
VLDL: 15.6 mg/dL (ref 0.0–40.0)

## 2018-04-01 LAB — VITAMIN D 25 HYDROXY (VIT D DEFICIENCY, FRACTURES): VITD: 35.5 ng/mL (ref 30.00–100.00)

## 2018-04-01 MED ORDER — CARVEDILOL 25 MG PO TABS: 25.0000 mg | ORAL_TABLET | Freq: Two times a day (BID) | ORAL | 11 refills | Status: DC

## 2018-04-01 MED ORDER — CIPROFLOXACIN HCL 250 MG PO TABS: 250.0000 mg | ORAL_TABLET | Freq: Two times a day (BID) | ORAL | 0 refills | Status: DC

## 2018-04-01 MED ORDER — POTASSIUM CHLORIDE CRYS ER 20 MEQ PO TBCR: 20.0000 meq | EXTENDED_RELEASE_TABLET | Freq: Every day | ORAL | 0 refills | Status: DC

## 2018-04-01 MED ORDER — DONEPEZIL HCL 5 MG PO TABS: 5.0000 mg | ORAL_TABLET | Freq: Every day | ORAL | 11 refills | Status: DC

## 2018-04-01 NOTE — Telephone Encounter (Signed)
Please call the patient's sister, Kirstan Fentress (home: [phone] or cell: [phone]) to schedule CT Head W/O Contrast.

## 2018-04-01 NOTE — Progress Notes (Signed)
Subjective:  Patient ID: Deborah Myers, female    DOB: [birth year]  Age: 82 y.o. MRN: [id number]  CC: No chief complaint on file.   HPI Kimiyo Carmicheal presents for memory loss: forgetful. Her sister is with her. Late paying bills, forgets her appointments. Ran out of meds.  Outpatient Medications Prior to Visit  Medication Sig Dispense Refill  . carvedilol (COREG) 25 MG tablet Take 1 tablet (25 mg total) by mouth 2 (two) times daily with a meal. (Patient not taking: Reported on 04/01/2018) 180 tablet 12  . Cholecalciferol (VITAMIN D3) 2000 units capsule Take 1 capsule (2,000 Units total) by mouth daily. (Patient not taking: Reported on 04/01/2018) 100 capsule 3   No facility-administered medications prior to visit.     ROS Review of Systems  Constitutional: Negative for activity change, appetite change, chills, fatigue and unexpected weight change.  HENT: Negative for congestion, mouth sores and sinus pressure.   Eyes: Negative for visual disturbance.  Respiratory: Negative for cough and chest tightness.   Gastrointestinal: Negative for abdominal pain and nausea.  Genitourinary: Negative for difficulty urinating, frequency and vaginal pain.  Musculoskeletal: Negative for back pain and gait problem.  Skin: Negative for pallor and rash.  Neurological: Negative for dizziness, tremors, weakness, numbness and headaches.  Psychiatric/Behavioral: Positive for decreased concentration. Negative for confusion, sleep disturbance and suicidal ideas.    Objective:  BP (!) 148/92 (BP Location: Right Arm, Patient Position: Sitting, Cuff Size: Normal)   Pulse 63   Temp 98.4 F (36.9 C) (Oral)   Ht 5\' 7"  (1.702 m)   Wt 167 lb (75.8 kg)   SpO2 100%   BMI 26.16 kg/m   BP Readings from Last 3 Encounters:  04/01/18 (!) 148/92  08/13/17 (!) 156/85  09/11/16 (!) 160/80    Wt Readings from Last 3 Encounters:  04/01/18 167 lb (75.8 kg)  08/13/17 167 lb (75.8 kg)  09/11/16 169 lb  (76.7 kg)    Physical Exam  Constitutional: She appears well-developed. No distress.  HENT:  Head: Normocephalic.  Right Ear: External ear normal.  Left Ear: External ear normal.  Nose: Nose normal.  Mouth/Throat: Oropharynx is clear and moist.  Eyes: Pupils are equal, round, and reactive to light. Conjunctivae are normal. Right eye exhibits no discharge. Left eye exhibits no discharge.  Neck: Normal range of motion. Neck supple. No JVD present. No tracheal deviation present. No thyromegaly present.  Cardiovascular: Normal rate, regular rhythm and normal heart sounds.  Pulmonary/Chest: No stridor. No respiratory distress. She has no wheezes.  Abdominal: Soft. Bowel sounds are normal. She exhibits no distension and no mass. There is no tenderness. There is no rebound and no guarding.  Musculoskeletal: She exhibits no edema or tenderness.  Lymphadenopathy:    She has no cervical adenopathy.  Neurological: She displays normal reflexes. No cranial nerve deficit. She exhibits normal muscle tone. Coordination normal.  Skin: No rash noted. No erythema.  Psychiatric: She has a normal mood and affect. Her behavior is normal. Judgment and thought content normal.   Disoriented Apr 06, 2009 Serial 7s - one correct Poor recall Clock drawing - almost correct  Lab Results  Component Value Date   WBC 4.9 11/03/2015   HGB 14.3 11/03/2015   HCT 44.2 11/03/2015   PLT 271.0 11/03/2015   GLUCOSE 93 11/03/2015   CHOL 243 (H) 11/03/2015   TRIG 74.0 11/03/2015   HDL 57.60 11/03/2015   LDLCALC 171 (H) 11/03/2015   ALT 12 06/04/2013  ALT 12 06/04/2013   AST 15 06/04/2013   AST 15 06/04/2013   NA 142 11/03/2015   K 4.1 11/03/2015   CL 104 11/03/2015   CREATININE 0.83 11/03/2015   BUN 14 11/03/2015   CO2 32 11/03/2015   TSH 3.10 11/03/2015    Nm Rai Therapy For Hyperthyroidism  Result Date: 03/31/2015 CLINICAL DATA:  Hyperthyroidism.  Multinodular goiter. EXAM: RADIOACTIVE IODINE THERAPY  FOR HYPERTHYROIDISM COMPARISON:  Ultrasound 03/31/2009. Nuclear medicine thyroid uptake and scan 03/17/2015. TECHNIQUE: Radioactive iodine prescribed by myself after reviewing the patient's prior studies yesterday. The risks and benefits of radioactive iodine therapy were discussed with the patient in detail by Dr. Carlis Abbott. Alternative therapies were also mentioned. Radiation safety was discussed with the patient, including how to protect the general public from exposure. There were no barriers to communication. Written consent was obtained. The patient then received a capsule containing the radiopharmaceutical. The patient will follow-up with the referring physician. RADIOPHARMACEUTICALS:  30.3 mCi I-131 sodium iodide orally IMPRESSION: Per oral administration of I-131 sodium iodide for the treatment of hyperthyroidism. Electronically Signed   By: Richardean Sale M.D.   On: 03/31/2015 13:35    Assessment & Plan:   There are no diagnoses linked to this encounter. I am having Bennie Dallas maintain her Vitamin D3 and carvedilol.  No orders of the defined types were placed in this encounter.    Follow-up: No follow-ups on file.  Walker Kehr, MD

## 2018-04-01 NOTE — Assessment & Plan Note (Signed)
labs

## 2018-04-01 NOTE — Assessment & Plan Note (Addendum)
?  Alzheimer's  strart Aricept Labs, head CT Neurol ref declined

## 2018-04-01 NOTE — Assessment & Plan Note (Signed)
Re-start Coreg

## 2018-04-03 ENCOUNTER — Telehealth: Payer: Self-pay | Admitting: Internal Medicine

## 2018-04-03 NOTE — Telephone Encounter (Signed)
Sister notified Pa can take up to 2-3 weeks

## 2018-04-03 NOTE — Telephone Encounter (Signed)
Please call pt's sister [name] back at [phone] Regarding pt's prescription for Aricept. May need a PA on it.

## 2018-04-09 ENCOUNTER — Ambulatory Visit (INDEPENDENT_AMBULATORY_CARE_PROVIDER_SITE_OTHER)
Admission: RE | Admit: 2018-04-09 | Discharge: 2018-04-09 | Disposition: A | Discharge status: Home / Self Care | Payer: Medicare Other | Source: Ambulatory Visit | Attending: Internal Medicine | Admitting: Internal Medicine

## 2018-04-09 DIAGNOSIS — R4020 Unspecified coma: Secondary | ICD-10-CM | POA: Diagnosis not present

## 2018-04-09 DIAGNOSIS — R29818 Other symptoms and signs involving the nervous system: Secondary | ICD-10-CM

## 2018-04-21 NOTE — Telephone Encounter (Signed)
Pt's sister calling to follow up on PA. Please call her back

## 2018-04-21 NOTE — Telephone Encounter (Signed)
Pharmacy did not have insurance on file. Sister notified and will take card to pharmacy.

## 2018-05-09 ENCOUNTER — Encounter: Payer: Self-pay | Admitting: Internal Medicine

## 2018-05-09 ENCOUNTER — Ambulatory Visit (INDEPENDENT_AMBULATORY_CARE_PROVIDER_SITE_OTHER): Payer: Medicare Other | Admitting: Internal Medicine

## 2018-05-09 DIAGNOSIS — N3000 Acute cystitis without hematuria: Secondary | ICD-10-CM

## 2018-05-09 DIAGNOSIS — E876 Hypokalemia: Secondary | ICD-10-CM | POA: Diagnosis not present

## 2018-05-09 DIAGNOSIS — R413 Other amnesia: Secondary | ICD-10-CM | POA: Diagnosis not present

## 2018-05-09 DIAGNOSIS — N390 Urinary tract infection, site not specified: Secondary | ICD-10-CM | POA: Insufficient documentation

## 2018-05-09 MED ORDER — MEMANTINE HCL-DONEPEZIL HCL 28-10 MG PO CP24
1.0000 | ORAL_CAPSULE | Freq: Every day | ORAL | 11 refills | Status: DC
Start: 2018-05-09 — End: 2018-10-22

## 2018-05-09 MED ORDER — POTASSIUM CHLORIDE CRYS ER 20 MEQ PO TBCR: 20.0000 meq | EXTENDED_RELEASE_TABLET | Freq: Every day | ORAL | 0 refills | Status: DC

## 2018-05-09 MED ORDER — CIPROFLOXACIN HCL 250 MG PO TABS: 250.0000 mg | ORAL_TABLET | Freq: Two times a day (BID) | ORAL | 0 refills | Status: DC

## 2018-05-09 NOTE — Assessment & Plan Note (Signed)
Take Dne[pezil 10 mg a day. When finished - start Namzaric 1 a day Neurol ref offered

## 2018-05-09 NOTE — Assessment & Plan Note (Signed)
Cipro

## 2018-05-09 NOTE — Patient Instructions (Addendum)
Take Dnepezil 10 mg a day. When finished - start Namzaric 1 a day

## 2018-05-09 NOTE — Assessment & Plan Note (Signed)
KCl 

## 2018-05-09 NOTE — Progress Notes (Signed)
Subjective:  Patient ID: Deborah Myers, female    DOB: [birth year]  Age: 82 y.o. MRN: [id number]  CC: No chief complaint on file.   HPI Deborah Myers presents for dementia, UTI, low K f/u. She never took Rx.Marland KitchenMarland KitchenComes w/sister Deborah Myers  Outpatient Medications Prior to Visit  Medication Sig Dispense Refill  . carvedilol (COREG) 25 MG tablet Take 1 tablet (25 mg total) by mouth 2 (two) times daily with a meal. 30 tablet 11  . Cholecalciferol (VITAMIN D3) 2000 units capsule Take 1 capsule (2,000 Units total) by mouth daily. 100 capsule 3  . ciprofloxacin (CIPRO) 250 MG tablet Take 1 tablet (250 mg total) by mouth 2 (two) times daily. 20 tablet 0  . donepezil (ARICEPT) 5 MG tablet Take 1 tablet (5 mg total) by mouth at bedtime. 30 tablet 11  . potassium chloride SA (K-DUR,KLOR-CON) 20 MEQ tablet Take 1 tablet (20 mEq total) by mouth daily. 30 tablet 0   No facility-administered medications prior to visit.     ROS: Review of Systems  Constitutional: Negative for activity change, appetite change, chills, fatigue and unexpected weight change.  HENT: Negative for congestion, mouth sores and sinus pressure.   Eyes: Negative for visual disturbance.  Respiratory: Negative for cough and chest tightness.   Gastrointestinal: Negative for abdominal pain and nausea.  Genitourinary: Negative for difficulty urinating, frequency and vaginal pain.  Musculoskeletal: Negative for back pain and gait problem.  Skin: Negative for pallor and rash.  Neurological: Negative for dizziness, tremors, weakness, numbness and headaches.  Psychiatric/Behavioral: Positive for confusion and decreased concentration. Negative for sleep disturbance.    Objective:  BP (!) 148/88 (BP Location: Left Arm, Patient Position: Sitting, Cuff Size: Normal)   Pulse (!) 51   Ht 5\' 7"  (1.702 m)   Wt 167 lb (75.8 kg)   SpO2 97%   BMI 26.16 kg/m   BP Readings from Last 3 Encounters:  05/09/18 (!) 148/88  04/01/18 (!) 148/92   08/13/17 (!) 156/85    Wt Readings from Last 3 Encounters:  05/09/18 167 lb (75.8 kg)  04/01/18 167 lb (75.8 kg)  08/13/17 167 lb (75.8 kg)    Physical Exam  Constitutional: She appears well-developed. No distress.  HENT:  Head: Normocephalic.  Right Ear: External ear normal.  Left Ear: External ear normal.  Nose: Nose normal.  Mouth/Throat: Oropharynx is clear and moist.  Eyes: Pupils are equal, round, and reactive to light. Conjunctivae are normal. Right eye exhibits no discharge. Left eye exhibits no discharge.  Neck: Normal range of motion. Neck supple. No JVD present. No tracheal deviation present. No thyromegaly present.  Cardiovascular: Normal rate, regular rhythm and normal heart sounds.  Pulmonary/Chest: No stridor. No respiratory distress. She has no wheezes.  Abdominal: Soft. Bowel sounds are normal. She exhibits no distension and no mass. There is no tenderness. There is no rebound and no guarding.  Musculoskeletal: She exhibits no edema or tenderness.  Lymphadenopathy:    She has no cervical adenopathy.  Neurological: She displays normal reflexes. No cranial nerve deficit. She exhibits normal muscle tone. Coordination normal.  Skin: No rash noted. No erythema.  Psychiatric: She has a normal mood and affect. Her behavior is normal. Thought content normal.   Forgetful  Lab Results  Component Value Date   WBC 5.1 04/01/2018   HGB 14.5 04/01/2018   HCT 43.8 04/01/2018   PLT 322.0 04/01/2018   GLUCOSE 97 04/01/2018   CHOL 283 (H) 04/01/2018   TRIG  78.0 04/01/2018   HDL 61.60 04/01/2018   LDLCALC 206 (H) 04/01/2018   ALT 6 04/01/2018   AST 11 04/01/2018   NA 140 04/01/2018   K 3.0 (L) 04/01/2018   CL 103 04/01/2018   CREATININE 0.84 04/01/2018   BUN 14 04/01/2018   CO2 30 04/01/2018   TSH 4.55 (H) 04/01/2018    Ct Head Wo Contrast  Result Date: 04/09/2018 CLINICAL DATA:  Altered level of consciousness EXAM: CT HEAD WITHOUT CONTRAST TECHNIQUE: Contiguous  axial images were obtained from the base of the skull through the vertex without intravenous contrast. COMPARISON:  None. FINDINGS: Brain: No evidence of acute infarction, hemorrhage, extra-axial collection, ventriculomegaly, or mass effect. Generalized cerebral atrophy. Periventricular white matter low attenuation likely secondary to microangiopathy. Vascular: Cerebrovascular atherosclerotic calcifications are noted. Skull: Negative for fracture or focal lesion. Sinuses/Orbits: Visualized portions of the orbits are unremarkable. Visualized portions of the paranasal sinuses and mastoid air cells are unremarkable. Other: None. IMPRESSION: No acute intracranial pathology. Electronically Signed   By: Kathreen Devoid   On: 04/09/2018 14:57    Assessment & Plan:   There are no diagnoses linked to this encounter.   No orders of the defined types were placed in this encounter.    Follow-up: No follow-ups on file.  Walker Kehr, MD

## 2018-08-12 ENCOUNTER — Ambulatory Visit: Payer: Medicare Other | Admitting: Internal Medicine

## 2018-08-12 DIAGNOSIS — Z0289 Encounter for other administrative examinations: Secondary | ICD-10-CM

## 2018-08-13 NOTE — Progress Notes (Deleted)
Subjective:   Deborah Myers is a 82 y.o. female who presents for Medicare Annual (Subsequent) preventive examination.  Review of Systems:  No ROS.  Medicare Wellness Visit. Additional risk factors are reflected in the social history.    Sleep patterns: {SX; SLEEP PATTERNS:18802::"feels rested on waking","does not get up to void","gets up *** times nightly to void","sleeps *** hours nightly"}.    Home Safety/Smoke Alarms: Feels safe in home. Smoke alarms in place.  Living environment; residence and Firearm Safety: {Rehab home environment / accessibility:30080::"no firearms","firearms stored safely"}. Seat Belt Safety/Bike Helmet: Wears seat belt.      Objective:     Vitals: There were no vitals taken for this visit.  There is no height or weight on file to calculate BMI.  Advanced Directives 08/13/2017  Does Patient Have a Medical Advance Directive? No  Would patient like information on creating a medical advance directive? Yes (ED - Information included in AVS)    Tobacco Social History   Tobacco Use  Smoking Status Never Smoker  Smokeless Tobacco Never Used     Counseling given: Not Answered  Past Medical History:  Diagnosis Date  . Arthritis   . Cataract   . Cecum mass 2008   Benign   . HTN (hypertension)   . Hyperlipidemia   . Osteoporosis    no per pt   Past Surgical History:  Procedure Laterality Date  . COLECTOMY  2008   Benign mass - laparoscopic right  . COLONOSCOPY     Family History  Problem Relation Age of Onset  . Coronary artery disease Mother   . Diabetes Mother   . Heart disease Mother        CHF  . Coronary artery disease Father   . Diabetes Father   . Heart disease Father   . Diabetes Sister   . Diabetes Brother   . Cancer Neg Hx   . COPD Neg Hx   . Colon cancer Neg Hx   . Esophageal cancer Neg Hx   . Stomach cancer Neg Hx   . Rectal cancer Neg Hx    Social History   Socioeconomic History  . Marital status: Single   Spouse name: Not on file  . Number of children: Not on file  . Years of education: 72  . Highest education level: Not on file  Occupational History  . Occupation: RETIRED    Employer: [employer]  Social Needs  . Financial resource strain: Not on file  . Food insecurity:    Worry: Not on file    Inability: Not on file  . Transportation needs:    Medical: Not on file    Non-medical: Not on file  Tobacco Use  . Smoking status: Never Smoker  . Smokeless tobacco: Never Used  Substance and Sexual Activity  . Alcohol use: No    Alcohol/week: 4.0 standard drinks    Types: 4 Standard drinks or equivalent per week    Comment: 03/06/2016 states she does not drink ETOH   . Drug use: No  . Sexual activity: Not Currently  Lifestyle  . Physical activity:    Days per week: Not on file    Minutes per session: Not on file  . Stress: Not on file  Relationships  . Social connections:    Talks on phone: Not on file    Gets together: Not on file    Attends religious service: Not on file    Active member of club or organization:  Not on file    Attends meetings of clubs or organizations: Not on file    Relationship status: Not on file  Other Topics Concern  . Not on file  Social History Narrative   [employer], [employer]. Never married. No children. Work - retired from [employer] after 34 years. Lives alone.   End of Life Care - interested in this. Provided a packet -Mar 19, 2012.    Outpatient Encounter Medications as of 08/14/2018  Medication Sig  . carvedilol (COREG) 25 MG tablet Take 1 tablet (25 mg total) by mouth 2 (two) times daily with a meal.  . Cholecalciferol (VITAMIN D3) 2000 units capsule Take 1 capsule (2,000 Units total) by mouth daily.  . ciprofloxacin (CIPRO) 250 MG tablet Take 1 tablet (250 mg total) by mouth 2 (two) times daily.  . Memantine HCl-Donepezil HCl (NAMZARIC) 28-10 MG CP24 Take 1 capsule by mouth daily.  . potassium chloride SA (K-DUR,KLOR-CON) 20 MEQ tablet Take 1 tablet (20 mEq  total) by mouth daily.   No facility-administered encounter medications on file as of 08/14/2018.     Activities of Daily Living In your present state of health, do you have any difficulty performing the following activities: 08/13/2017  Hearing? N  Vision? N  Walking or climbing stairs? N  Dressing or bathing? N  Doing errands, shopping? N  Preparing Food and eating ? N  Using the Toilet? N  In the past six months, have you accidently leaked urine? N  Do you have problems with loss of bowel control? N  Managing your Medications? N  Managing your Finances? N  Housekeeping or managing your Housekeeping? N  Some recent data might be hidden    Patient Care Team: Plotnikov, Evie Lacks, MD as PCP - General (Internal Medicine) Rutherford Guys, MD (Ophthalmology) Irene Shipper, MD (Gastroenterology)    Assessment:   This is a routine wellness examination for [facility]. Physical assessment deferred to PCP.   Exercise Activities and Dietary recommendations   Diet (meal preparation, eat out, water intake, caffeinated beverages, dairy products, fruits and vegetables): {Desc; diets:16563}   Goals    . Exercise 150 minutes per week (moderate activity)     Will start back doing water aerobics    . Increase the amount of  water I drink, increase my physical activity     I will 3 bottles of water a day and place the water on the table so I will not forget. I will do chair exercises and walk in my neighborhood.       Fall Risk Fall Risk  08/13/2017 06/10/2017 03/06/2016 03/06/2016 02/09/2015  Falls in the past year? Yes No No No No  Comment - Emmi Telephone Survey: data to providers prior to load - - -  Number falls in past yr: 1 - - - -  Injury with Fall? No - - - -  Follow up Falls prevention discussed;Education provided - - - -    Depression Screen PHQ 2/9 Scores 08/13/2017 03/06/2016 03/06/2016 02/09/2015  PHQ - 2 Score 0 0 1 0  PHQ- 9 Score 0 - - -     Cognitive Function MMSE - Mini  Mental State Exam 08/13/2017 03/06/2016  Not completed: - (No Data)  Orientation to time 5 -  Orientation to Place 5 -  Registration 3 -  Attention/ Calculation 4 -  Recall 1 -  Language- name 2 objects 2 -  Language- repeat 1 -  Language- follow 3 step command  3 -  Language- read & follow direction 1 -  Write a sentence 1 -  Copy design 1 -  Total score 27 -        Immunization History  Administered Date(s) Administered  . Influenza, High Dose Seasonal PF 09/11/2016, 08/13/2017  . Influenza,inj,Quad PF,6+ Mos 11/03/2015  . Pneumococcal Conjugate-13 01/04/2014  . Pneumococcal Polysaccharide-23 03/24/2009, 04/17/2011  . Td 03/24/2009  . Zoster 06/11/2013   Screening Tests Health Maintenance  Topic Date Due  . DEXA SCAN  02/14/2003  . INFLUENZA VACCINE  06/19/2018  . TETANUS/TDAP  03/25/2019  . PNA vac Low Risk Adult  Completed      Plan:    I have personally reviewed and noted the following in the patient's chart:   . Medical and social history . Use of alcohol, tobacco or illicit drugs  . Current medications and supplements . Functional ability and status . Nutritional status . Physical activity . Advanced directives . List of other physicians . Vitals . Screenings to include cognitive, depression, and falls . Referrals and appointments  In addition, I have reviewed and discussed with patient certain preventive protocols, quality metrics, and best practice recommendations. A written personalized care plan for preventive services as well as general preventive health recommendations were provided to patient.     Michiel Cowboy, RN  08/13/2018

## 2018-08-14 ENCOUNTER — Ambulatory Visit: Payer: Medicare Other

## 2018-08-25 DIAGNOSIS — Z961 Presence of intraocular lens: Secondary | ICD-10-CM | POA: Diagnosis not present

## 2018-10-22 ENCOUNTER — Ambulatory Visit (INDEPENDENT_AMBULATORY_CARE_PROVIDER_SITE_OTHER): Payer: Medicare Other | Admitting: Internal Medicine

## 2018-10-22 ENCOUNTER — Encounter: Payer: Self-pay | Admitting: Internal Medicine

## 2018-10-22 ENCOUNTER — Other Ambulatory Visit: Payer: Self-pay

## 2018-10-22 VITALS — BP 136/88 | HR 97 | Temp 97.9°F | Ht 67.0 in | Wt 167.0 lb

## 2018-10-22 DIAGNOSIS — F0280 Dementia in other diseases classified elsewhere without behavioral disturbance: Secondary | ICD-10-CM

## 2018-10-22 DIAGNOSIS — G301 Alzheimer's disease with late onset: Secondary | ICD-10-CM | POA: Diagnosis not present

## 2018-10-22 DIAGNOSIS — I10 Essential (primary) hypertension: Secondary | ICD-10-CM | POA: Diagnosis not present

## 2018-10-22 DIAGNOSIS — Z23 Encounter for immunization: Secondary | ICD-10-CM | POA: Diagnosis not present

## 2018-10-22 DIAGNOSIS — E785 Hyperlipidemia, unspecified: Secondary | ICD-10-CM | POA: Diagnosis not present

## 2018-10-22 DIAGNOSIS — M810 Age-related osteoporosis without current pathological fracture: Secondary | ICD-10-CM

## 2018-10-22 MED ORDER — DONEPEZIL HCL 10 MG PO TABS: 10.0000 mg | ORAL_TABLET | Freq: Every day | ORAL | 11 refills | Status: DC

## 2018-10-22 MED ORDER — CARVEDILOL 25 MG PO TABS: 25.0000 mg | ORAL_TABLET | Freq: Two times a day (BID) | ORAL | 11 refills | Status: DC

## 2018-10-22 MED ORDER — MEMANTINE HCL 10 MG PO TABS: 10.0000 mg | ORAL_TABLET | Freq: Two times a day (BID) | ORAL | 11 refills | Status: DC

## 2018-10-22 MED ORDER — B COMPLEX PO TABS: 1.0000 | ORAL_TABLET | Freq: Every day | ORAL | 3 refills | Status: AC

## 2018-10-22 MED ORDER — VITAMIN D3 50 MCG (2000 UT) PO CAPS: 2000.0000 [IU] | ORAL_CAPSULE | Freq: Every day | ORAL | 3 refills | Status: AC

## 2018-10-22 NOTE — Assessment & Plan Note (Signed)
Coreg Labs 

## 2018-10-22 NOTE — Assessment & Plan Note (Signed)
On  Aricept. Namzaric - too $$$ We added [place] ref

## 2018-10-22 NOTE — Progress Notes (Signed)
Subjective:  Patient ID: Deborah Myers, female    DOB: 1936-07-01  Age: 82 y.o. MRN: [id number]  CC: No chief complaint on file.   HPI Deborah Myers presents for Alzheimer's, HTN, osteoporosis f/u. Comes w/sister [name]  Outpatient Medications Prior to Visit  Medication Sig Dispense Refill  . carvedilol (COREG) 25 MG tablet Take 1 tablet (25 mg total) by mouth 2 (two) times daily with a meal. 30 tablet 11  . Cholecalciferol (VITAMIN D3) 2000 units capsule Take 1 capsule (2,000 Units total) by mouth daily. 100 capsule 3  . ciprofloxacin (CIPRO) 250 MG tablet Take 1 tablet (250 mg total) by mouth 2 (two) times daily. 14 tablet 0  . Memantine HCl-Donepezil HCl (NAMZARIC) 28-10 MG CP24 Take 1 capsule by mouth daily. 30 capsule 11  . potassium chloride SA (K-DUR,KLOR-CON) 20 MEQ tablet Take 1 tablet (20 mEq total) by mouth daily. 30 tablet 0   No facility-administered medications prior to visit.     ROS: Review of Systems  Constitutional: Negative for activity change, appetite change, chills, fatigue and unexpected weight change.  HENT: Negative for congestion, mouth sores and sinus pressure.   Eyes: Negative for visual disturbance.  Respiratory: Negative for cough and chest tightness.   Gastrointestinal: Negative for abdominal pain and nausea.  Genitourinary: Negative for difficulty urinating, frequency and vaginal pain.  Musculoskeletal: Negative for back pain and gait problem.  Skin: Negative for pallor and rash.  Neurological: Negative for dizziness, tremors, weakness, numbness and headaches.  Psychiatric/Behavioral: Positive for confusion and decreased concentration. Negative for sleep disturbance and suicidal ideas.    Objective:  There were no vitals taken for this visit.  BP Readings from Last 3 Encounters:  05/09/18 (!) 148/88  04/01/18 (!) 148/92  08/13/17 (!) 156/85    Wt Readings from Last 3 Encounters:  05/09/18 167 lb (75.8 kg)  04/01/18 167 lb (75.8  kg)  08/13/17 167 lb (75.8 kg)    Physical Exam  Constitutional: She appears well-developed. No distress.  HENT:  Head: Normocephalic.  Right Ear: External ear normal.  Left Ear: External ear normal.  Nose: Nose normal.  Mouth/Throat: Oropharynx is clear and moist.  Eyes: Pupils are equal, round, and reactive to light. Conjunctivae are normal. Right eye exhibits no discharge. Left eye exhibits no discharge.  Neck: Normal range of motion. Neck supple. No JVD present. No tracheal deviation present. No thyromegaly present.  Cardiovascular: Normal rate, regular rhythm and normal heart sounds.  Pulmonary/Chest: No stridor. No respiratory distress. She has no wheezes.  Abdominal: Soft. Bowel sounds are normal. She exhibits no distension and no mass. There is no tenderness. There is no rebound and no guarding.  Musculoskeletal: She exhibits no edema or tenderness.  Lymphadenopathy:    She has no cervical adenopathy.  Neurological: She displays normal reflexes. No cranial nerve deficit. She exhibits normal muscle tone. Coordination normal.  Skin: No rash noted. No erythema.  Psychiatric: She has a normal mood and affect. Her behavior is normal. Judgment and thought content normal.  alert, cooperative, disoriented   Lab Results  Component Value Date   WBC 5.1 04/01/2018   HGB 14.5 04/01/2018   HCT 43.8 04/01/2018   PLT 322.0 04/01/2018   GLUCOSE 97 04/01/2018   CHOL 283 (H) 04/01/2018   TRIG 78.0 04/01/2018   HDL 61.60 04/01/2018   LDLCALC 206 (H) 04/01/2018   ALT 6 04/01/2018   AST 11 04/01/2018   NA 140 04/01/2018   K 3.0 (L)  04/01/2018   CL 103 04/01/2018   CREATININE 0.84 04/01/2018   BUN 14 04/01/2018   CO2 30 04/01/2018   TSH 4.55 (H) 04/01/2018    Ct Head Wo Contrast  Result Date: 04/09/2018 CLINICAL DATA:  Altered level of consciousness EXAM: CT HEAD WITHOUT CONTRAST TECHNIQUE: Contiguous axial images were obtained from the base of the skull through the vertex without  intravenous contrast. COMPARISON:  None. FINDINGS: Brain: No evidence of acute infarction, hemorrhage, extra-axial collection, ventriculomegaly, or mass effect. Generalized cerebral atrophy. Periventricular white matter low attenuation likely secondary to microangiopathy. Vascular: Cerebrovascular atherosclerotic calcifications are noted. Skull: Negative for fracture or focal lesion. Sinuses/Orbits: Visualized portions of the orbits are unremarkable. Visualized portions of the paranasal sinuses and mastoid air cells are unremarkable. Other: None. IMPRESSION: No acute intracranial pathology. Electronically Signed   By: Kathreen Devoid   On: 04/09/2018 14:57    Assessment & Plan:   There are no diagnoses linked to this encounter.   No orders of the defined types were placed in this encounter.    Follow-up: No follow-ups on file.  Walker Kehr, MD

## 2018-10-22 NOTE — Assessment & Plan Note (Signed)
Not taking meds 

## 2018-10-22 NOTE — Assessment & Plan Note (Signed)
Vit D 

## 2018-10-27 ENCOUNTER — Telehealth: Payer: Self-pay | Admitting: Internal Medicine

## 2018-10-27 NOTE — Telephone Encounter (Signed)
Copied from [place] [phone]. Topic: Quick Communication - Rx Refill/Question >> Oct 27, 2018  2:06 PM Scherrie Gerlach wrote: Medication: memantine (NAMENDA) 10 MG tablet  Pt states this med is not at the pharmacy, and pt was told it was to be called in.  It notes it was printed, but pt states she did not receive a paper Rx.  Also, pt states if this is expensive, she will need something else.  Advised pt to call back and let us know if it is.  [facility] [phone] - [place], [address] AT [address] [facility] [address] [place] [phone] ([phone] (Fax)

## 2018-10-28 MED ORDER — MEMANTINE HCL 10 MG PO TABS: 10.0000 mg | ORAL_TABLET | Freq: Two times a day (BID) | ORAL | 11 refills | Status: DC

## 2018-10-28 NOTE — Telephone Encounter (Signed)
Resent rx to pof electronically.Marland KitchenChryl Heck

## 2018-11-03 ENCOUNTER — Telehealth: Payer: Self-pay | Admitting: Internal Medicine

## 2018-11-03 NOTE — Telephone Encounter (Signed)
Copied from [place] [phone]. Topic: Quick Communication - Home Health Verbal Orders >> Nov 03, 2018  3:52 PM Alanda Slim E wrote: Caller/Agency: [facility] Number: [phone] Wanted to advise that the Skilled nursing and home health visit was delayed due to not being able to reach the Pt. They were able to reach the Pt today and will be seeing/opening her tomorrow [phone].

## 2018-11-03 NOTE — Telephone Encounter (Signed)
[name] with [facility] calling and states that the patient has refused starting skilled nursing and home health until Thursday 11/06/18.  CB#: [phone]

## 2018-11-04 NOTE — Telephone Encounter (Signed)
Noted thank you

## 2018-11-04 NOTE — Telephone Encounter (Signed)
FYI

## 2018-11-07 NOTE — Telephone Encounter (Signed)
noted 

## 2018-11-07 NOTE — Telephone Encounter (Signed)
Deborah Myers with [facility] states that the pts sister refused services for today and stated for them to call back after 12/30 to see if they would like to start service then.

## 2018-11-11 ENCOUNTER — Telehealth: Payer: Self-pay

## 2018-11-11 NOTE — Telephone Encounter (Signed)
Letter mailed to post office  Copied from [place] [phone]. Topic: General - Other >> Oct 31, 2018  2:35 PM Yvette Rack wrote: Reason for CRM: pt sister [name] [phone] calling stating that her sister need a letter to post office due to her sister not being able to go to curb to get her mail due to it being to hard to walk to curb and patient  would like to get a mailbox on outside of house  pt sister [name] will come by to pick letter up or the office could mail letter to post office   [place] Delight Stare

## 2018-11-20 ENCOUNTER — Telehealth: Payer: Self-pay | Admitting: Internal Medicine

## 2018-11-20 DIAGNOSIS — G301 Alzheimer's disease with late onset: Secondary | ICD-10-CM | POA: Diagnosis not present

## 2018-11-20 DIAGNOSIS — R269 Unspecified abnormalities of gait and mobility: Secondary | ICD-10-CM | POA: Diagnosis not present

## 2018-11-20 DIAGNOSIS — M810 Age-related osteoporosis without current pathological fracture: Secondary | ICD-10-CM | POA: Diagnosis not present

## 2018-11-20 DIAGNOSIS — I10 Essential (primary) hypertension: Secondary | ICD-10-CM | POA: Diagnosis not present

## 2018-11-20 DIAGNOSIS — Z9181 History of falling: Secondary | ICD-10-CM | POA: Diagnosis not present

## 2018-11-20 DIAGNOSIS — M1990 Unspecified osteoarthritis, unspecified site: Secondary | ICD-10-CM | POA: Diagnosis not present

## 2018-11-20 DIAGNOSIS — F0280 Dementia in other diseases classified elsewhere without behavioral disturbance: Secondary | ICD-10-CM | POA: Diagnosis not present

## 2018-11-20 NOTE — Telephone Encounter (Signed)
Copied from [place] [phone]. Topic: Quick Communication - Home Health Verbal Orders >> Nov 20, 2018  4:47 PM Yvette Rack wrote: Caller/Agency: Gaetano Net with [facility] Number: [phone] Requesting OT/PT/Skilled Nursing/Social Work: Skilled nursing  Frequency: 1 times a week for 1 week, 2 times a week for 5 weeks with 2 PRN visits  Almyra Free stated she has questions regarding the donepezil (ARICEPT) 10 MG tablet and clarification of the prescription. Almyra Free also asked if there is an assistance program for the memantine (NAMENDA) 10 MG tablet.

## 2018-11-21 NOTE — Telephone Encounter (Signed)
Thank you :)

## 2018-11-21 NOTE — Telephone Encounter (Signed)
Verbals given and meds clarified. FYI

## 2018-11-26 DIAGNOSIS — M79671 Pain in right foot: Secondary | ICD-10-CM | POA: Diagnosis not present

## 2018-11-26 DIAGNOSIS — I739 Peripheral vascular disease, unspecified: Secondary | ICD-10-CM | POA: Diagnosis not present

## 2018-11-26 DIAGNOSIS — M79672 Pain in left foot: Secondary | ICD-10-CM | POA: Diagnosis not present

## 2018-11-26 DIAGNOSIS — L603 Nail dystrophy: Secondary | ICD-10-CM | POA: Diagnosis not present

## 2018-11-27 ENCOUNTER — Telehealth: Payer: Self-pay | Admitting: Internal Medicine

## 2018-11-27 ENCOUNTER — Other Ambulatory Visit: Payer: Self-pay | Admitting: Internal Medicine

## 2018-11-27 DIAGNOSIS — R269 Unspecified abnormalities of gait and mobility: Secondary | ICD-10-CM | POA: Diagnosis not present

## 2018-11-27 DIAGNOSIS — F0280 Dementia in other diseases classified elsewhere without behavioral disturbance: Secondary | ICD-10-CM | POA: Diagnosis not present

## 2018-11-27 DIAGNOSIS — M810 Age-related osteoporosis without current pathological fracture: Secondary | ICD-10-CM | POA: Diagnosis not present

## 2018-11-27 DIAGNOSIS — I10 Essential (primary) hypertension: Secondary | ICD-10-CM | POA: Diagnosis not present

## 2018-11-27 DIAGNOSIS — G301 Alzheimer's disease with late onset: Secondary | ICD-10-CM | POA: Diagnosis not present

## 2018-11-27 DIAGNOSIS — M1990 Unspecified osteoarthritis, unspecified site: Secondary | ICD-10-CM | POA: Diagnosis not present

## 2018-11-27 MED ORDER — DONEPEZIL HCL 10 MG PO TABS: 10.0000 mg | ORAL_TABLET | Freq: Every day | ORAL | 5 refills | Status: DC

## 2018-11-27 NOTE — Telephone Encounter (Signed)
Copied from [place] [id number]. Topic: General - Other >> Nov 27, 2018  3:03 PM Keene Breath wrote: Reason for CRM: Patient called to check the status of her request to send the patient assistance paperwork for memantine (NAMENDA) 10 MG tablet to her sister [name].  Please advise and call back at [phone]

## 2018-11-27 NOTE — Telephone Encounter (Signed)
Requested Prescriptions  Pending Prescriptions Disp Refills  . donepezil (ARICEPT) 10 MG tablet 30 tablet 5    Sig: Take 1 tablet (10 mg total) by mouth at bedtime.     Neurology:  Alzheimer's Agents Passed - 11/27/2018  3:04 PM      Passed - Valid encounter within last 6 months    Recent Outpatient Visits          1 month ago Late onset Alzheimer's disease without behavioral disturbance ([place])   [place], MD   6 months ago Memory loss   [place], MD   8 months ago [place], Evie Lacks, MD   2 years ago Insomnia, unspecified type   [place], Evie Lacks, MD   2 years ago Well adult exam   [place], MD      Future Appointments            In 1 month Plotnikov, Evie Lacks, MD [place], [place]

## 2018-11-27 NOTE — Telephone Encounter (Signed)
Copied from [place] [phone]. Topic: Quick Communication - Rx Refill/Question >> Nov 27, 2018  3:01 PM Keene Breath wrote: Medication: donepezil (ARICEPT) 10 MG tablet  Patient called to request refill for the above medication  Preferred Pharmacy (with phone number or street name): [facility] [phone] - [place], [address] AT [address] [place] [phone] (Phone) [phone] (Fax)

## 2018-11-28 DIAGNOSIS — G301 Alzheimer's disease with late onset: Secondary | ICD-10-CM | POA: Diagnosis not present

## 2018-11-28 DIAGNOSIS — F0280 Dementia in other diseases classified elsewhere without behavioral disturbance: Secondary | ICD-10-CM | POA: Diagnosis not present

## 2018-11-28 DIAGNOSIS — R269 Unspecified abnormalities of gait and mobility: Secondary | ICD-10-CM | POA: Diagnosis not present

## 2018-11-28 DIAGNOSIS — I10 Essential (primary) hypertension: Secondary | ICD-10-CM | POA: Diagnosis not present

## 2018-11-28 DIAGNOSIS — M1990 Unspecified osteoarthritis, unspecified site: Secondary | ICD-10-CM | POA: Diagnosis not present

## 2018-11-28 DIAGNOSIS — M810 Age-related osteoporosis without current pathological fracture: Secondary | ICD-10-CM | POA: Diagnosis not present

## 2018-12-02 DIAGNOSIS — M810 Age-related osteoporosis without current pathological fracture: Secondary | ICD-10-CM | POA: Diagnosis not present

## 2018-12-02 DIAGNOSIS — M1990 Unspecified osteoarthritis, unspecified site: Secondary | ICD-10-CM | POA: Diagnosis not present

## 2018-12-02 DIAGNOSIS — R269 Unspecified abnormalities of gait and mobility: Secondary | ICD-10-CM | POA: Diagnosis not present

## 2018-12-02 DIAGNOSIS — I10 Essential (primary) hypertension: Secondary | ICD-10-CM | POA: Diagnosis not present

## 2018-12-02 DIAGNOSIS — F0280 Dementia in other diseases classified elsewhere without behavioral disturbance: Secondary | ICD-10-CM | POA: Diagnosis not present

## 2018-12-02 DIAGNOSIS — G301 Alzheimer's disease with late onset: Secondary | ICD-10-CM | POA: Diagnosis not present

## 2018-12-02 NOTE — Telephone Encounter (Signed)
LM notifying sister that forms were mailed to pts address, the only address we have on file

## 2018-12-04 DIAGNOSIS — F0280 Dementia in other diseases classified elsewhere without behavioral disturbance: Secondary | ICD-10-CM | POA: Diagnosis not present

## 2018-12-04 DIAGNOSIS — M810 Age-related osteoporosis without current pathological fracture: Secondary | ICD-10-CM | POA: Diagnosis not present

## 2018-12-04 DIAGNOSIS — I10 Essential (primary) hypertension: Secondary | ICD-10-CM | POA: Diagnosis not present

## 2018-12-04 DIAGNOSIS — M1990 Unspecified osteoarthritis, unspecified site: Secondary | ICD-10-CM | POA: Diagnosis not present

## 2018-12-04 DIAGNOSIS — G301 Alzheimer's disease with late onset: Secondary | ICD-10-CM | POA: Diagnosis not present

## 2018-12-04 DIAGNOSIS — R269 Unspecified abnormalities of gait and mobility: Secondary | ICD-10-CM | POA: Diagnosis not present

## 2018-12-04 NOTE — Telephone Encounter (Signed)
Patient's sister requesting a call back from [place] with additional questions about form. Specifically, if she should mail form to PO box listed on paperwork. Please advise.

## 2018-12-05 NOTE — Telephone Encounter (Signed)
LM notifying to mail papers to office

## 2018-12-10 DIAGNOSIS — G301 Alzheimer's disease with late onset: Secondary | ICD-10-CM | POA: Diagnosis not present

## 2018-12-10 DIAGNOSIS — M810 Age-related osteoporosis without current pathological fracture: Secondary | ICD-10-CM | POA: Diagnosis not present

## 2018-12-10 DIAGNOSIS — I10 Essential (primary) hypertension: Secondary | ICD-10-CM | POA: Diagnosis not present

## 2018-12-10 DIAGNOSIS — R269 Unspecified abnormalities of gait and mobility: Secondary | ICD-10-CM | POA: Diagnosis not present

## 2018-12-10 DIAGNOSIS — Z9181 History of falling: Secondary | ICD-10-CM | POA: Diagnosis not present

## 2018-12-10 DIAGNOSIS — M1990 Unspecified osteoarthritis, unspecified site: Secondary | ICD-10-CM | POA: Diagnosis not present

## 2018-12-10 DIAGNOSIS — F0280 Dementia in other diseases classified elsewhere without behavioral disturbance: Secondary | ICD-10-CM | POA: Diagnosis not present

## 2018-12-12 DIAGNOSIS — R269 Unspecified abnormalities of gait and mobility: Secondary | ICD-10-CM | POA: Diagnosis not present

## 2018-12-12 DIAGNOSIS — G301 Alzheimer's disease with late onset: Secondary | ICD-10-CM | POA: Diagnosis not present

## 2018-12-12 DIAGNOSIS — M810 Age-related osteoporosis without current pathological fracture: Secondary | ICD-10-CM | POA: Diagnosis not present

## 2018-12-12 DIAGNOSIS — M1990 Unspecified osteoarthritis, unspecified site: Secondary | ICD-10-CM | POA: Diagnosis not present

## 2018-12-12 DIAGNOSIS — F0280 Dementia in other diseases classified elsewhere without behavioral disturbance: Secondary | ICD-10-CM | POA: Diagnosis not present

## 2018-12-12 DIAGNOSIS — I10 Essential (primary) hypertension: Secondary | ICD-10-CM | POA: Diagnosis not present

## 2018-12-16 ENCOUNTER — Other Ambulatory Visit: Payer: Self-pay

## 2018-12-16 ENCOUNTER — Telehealth: Payer: Self-pay | Admitting: Internal Medicine

## 2018-12-16 DIAGNOSIS — G301 Alzheimer's disease with late onset: Secondary | ICD-10-CM | POA: Diagnosis not present

## 2018-12-16 DIAGNOSIS — M810 Age-related osteoporosis without current pathological fracture: Secondary | ICD-10-CM | POA: Diagnosis not present

## 2018-12-16 DIAGNOSIS — F0280 Dementia in other diseases classified elsewhere without behavioral disturbance: Secondary | ICD-10-CM | POA: Diagnosis not present

## 2018-12-16 DIAGNOSIS — M1990 Unspecified osteoarthritis, unspecified site: Secondary | ICD-10-CM | POA: Diagnosis not present

## 2018-12-16 DIAGNOSIS — I10 Essential (primary) hypertension: Secondary | ICD-10-CM | POA: Diagnosis not present

## 2018-12-16 DIAGNOSIS — R269 Unspecified abnormalities of gait and mobility: Secondary | ICD-10-CM | POA: Diagnosis not present

## 2018-12-16 MED ORDER — MEMANTINE HCL 10 MG PO TABS: 10.0000 mg | ORAL_TABLET | Freq: Two times a day (BID) | ORAL | 11 refills | Status: DC

## 2018-12-16 NOTE — Telephone Encounter (Signed)
Please advise 

## 2018-12-16 NOTE — Telephone Encounter (Signed)
Yes - morning is ok Thx

## 2018-12-16 NOTE — Telephone Encounter (Signed)
Copied from [place] [phone]. Topic: Quick Communication - See Telephone Encounter >> Dec 16, 2018  3:13 PM Antonieta Iba C wrote: CRM for notification. See Telephone encounter for: 12/16/18.  Almyra Free home health nurse is calling in to make provider aware that due to pt's dementia she has not taken her Aricept and carvedilol (COREG) 25 MG tablet since Sunday. Almyra Free said that she gave pt a dose today. But would like to know if these medications directions be changed to her taking them in the morning instead. Almyra Free said that pt's sister is going to come to her every morning to make sure that pt take her medication going forward.   Pt's BP today is 160/102   Almyra Free [phone]

## 2018-12-17 NOTE — Telephone Encounter (Signed)
Home health nurse notified.

## 2018-12-18 DIAGNOSIS — I10 Essential (primary) hypertension: Secondary | ICD-10-CM | POA: Diagnosis not present

## 2018-12-18 DIAGNOSIS — M1990 Unspecified osteoarthritis, unspecified site: Secondary | ICD-10-CM | POA: Diagnosis not present

## 2018-12-18 DIAGNOSIS — M810 Age-related osteoporosis without current pathological fracture: Secondary | ICD-10-CM | POA: Diagnosis not present

## 2018-12-18 DIAGNOSIS — G301 Alzheimer's disease with late onset: Secondary | ICD-10-CM | POA: Diagnosis not present

## 2018-12-18 DIAGNOSIS — F0280 Dementia in other diseases classified elsewhere without behavioral disturbance: Secondary | ICD-10-CM | POA: Diagnosis not present

## 2018-12-18 DIAGNOSIS — R269 Unspecified abnormalities of gait and mobility: Secondary | ICD-10-CM | POA: Diagnosis not present

## 2018-12-20 DIAGNOSIS — M1990 Unspecified osteoarthritis, unspecified site: Secondary | ICD-10-CM | POA: Diagnosis not present

## 2018-12-20 DIAGNOSIS — R269 Unspecified abnormalities of gait and mobility: Secondary | ICD-10-CM | POA: Diagnosis not present

## 2018-12-20 DIAGNOSIS — I10 Essential (primary) hypertension: Secondary | ICD-10-CM | POA: Diagnosis not present

## 2018-12-20 DIAGNOSIS — M810 Age-related osteoporosis without current pathological fracture: Secondary | ICD-10-CM | POA: Diagnosis not present

## 2018-12-20 DIAGNOSIS — F0280 Dementia in other diseases classified elsewhere without behavioral disturbance: Secondary | ICD-10-CM | POA: Diagnosis not present

## 2018-12-20 DIAGNOSIS — G301 Alzheimer's disease with late onset: Secondary | ICD-10-CM | POA: Diagnosis not present

## 2018-12-20 DIAGNOSIS — Z9181 History of falling: Secondary | ICD-10-CM | POA: Diagnosis not present

## 2018-12-22 ENCOUNTER — Telehealth: Payer: Self-pay | Admitting: Internal Medicine

## 2018-12-22 NOTE — Telephone Encounter (Signed)
Copied from [place] [phone]. Topic: Quick Communication - Home Health Verbal Orders >> Dec 22, 2018  3:31 PM Bea Graff, NT wrote: Caller/Agency: [facility] Callback Number: [phone] Requesting OT/PT/Skilled Nursing/Social Work: Nursing  Frequency: 1 time a week for 3 weeks for [facility] care

## 2018-12-22 NOTE — Telephone Encounter (Signed)
Routing to dr plotnikov, please advise, I will call home health back ,thanks

## 2018-12-23 DIAGNOSIS — G301 Alzheimer's disease with late onset: Secondary | ICD-10-CM | POA: Diagnosis not present

## 2018-12-23 DIAGNOSIS — M810 Age-related osteoporosis without current pathological fracture: Secondary | ICD-10-CM | POA: Diagnosis not present

## 2018-12-23 DIAGNOSIS — I10 Essential (primary) hypertension: Secondary | ICD-10-CM | POA: Diagnosis not present

## 2018-12-23 DIAGNOSIS — F0280 Dementia in other diseases classified elsewhere without behavioral disturbance: Secondary | ICD-10-CM | POA: Diagnosis not present

## 2018-12-23 DIAGNOSIS — M1990 Unspecified osteoarthritis, unspecified site: Secondary | ICD-10-CM | POA: Diagnosis not present

## 2018-12-23 DIAGNOSIS — R269 Unspecified abnormalities of gait and mobility: Secondary | ICD-10-CM | POA: Diagnosis not present

## 2018-12-23 NOTE — Telephone Encounter (Signed)
Ok What is Namenda care? Thx

## 2018-12-25 DIAGNOSIS — G301 Alzheimer's disease with late onset: Secondary | ICD-10-CM | POA: Diagnosis not present

## 2018-12-25 DIAGNOSIS — R269 Unspecified abnormalities of gait and mobility: Secondary | ICD-10-CM | POA: Diagnosis not present

## 2018-12-25 DIAGNOSIS — F0280 Dementia in other diseases classified elsewhere without behavioral disturbance: Secondary | ICD-10-CM | POA: Diagnosis not present

## 2018-12-25 DIAGNOSIS — I10 Essential (primary) hypertension: Secondary | ICD-10-CM | POA: Diagnosis not present

## 2018-12-25 DIAGNOSIS — M810 Age-related osteoporosis without current pathological fracture: Secondary | ICD-10-CM | POA: Diagnosis not present

## 2018-12-25 DIAGNOSIS — M1990 Unspecified osteoarthritis, unspecified site: Secondary | ICD-10-CM | POA: Diagnosis not present

## 2018-12-26 ENCOUNTER — Telehealth: Payer: Self-pay

## 2018-12-26 NOTE — Telephone Encounter (Signed)
Noted. Pls ask the sister to look at [facility] for [facility]. She could move in w/a relative if it is an option. Thx

## 2018-12-26 NOTE — Telephone Encounter (Signed)
Verbals given, FYI  Copied from [place] [phone]. Topic: Quick Communication - Home Health Verbal Orders >> Dec 24, 2018  9:19 AM Ivar Drape wrote: Caller/Agency:  Almyra Free - Nurse w/[facility] Number: [phone]  She wanted the provider to know that the patient's sister did not administer the patient's meds for 4 days.  So the patient's blood pressure went up.  So the nurse is going to stay twice a week instead of once a week.

## 2018-12-30 DIAGNOSIS — F0280 Dementia in other diseases classified elsewhere without behavioral disturbance: Secondary | ICD-10-CM | POA: Diagnosis not present

## 2018-12-30 DIAGNOSIS — G301 Alzheimer's disease with late onset: Secondary | ICD-10-CM | POA: Diagnosis not present

## 2018-12-30 DIAGNOSIS — M810 Age-related osteoporosis without current pathological fracture: Secondary | ICD-10-CM | POA: Diagnosis not present

## 2018-12-30 DIAGNOSIS — M1990 Unspecified osteoarthritis, unspecified site: Secondary | ICD-10-CM | POA: Diagnosis not present

## 2018-12-30 DIAGNOSIS — I10 Essential (primary) hypertension: Secondary | ICD-10-CM | POA: Diagnosis not present

## 2018-12-30 DIAGNOSIS — R269 Unspecified abnormalities of gait and mobility: Secondary | ICD-10-CM | POA: Diagnosis not present

## 2018-12-31 NOTE — Telephone Encounter (Signed)
Pts sister stated that the patient will not be moved into a facility because she is not that bad off and she is going over to her house everyday to make sure she is taking her medications.

## 2018-12-31 NOTE — Telephone Encounter (Signed)
Almyra Free with [facility] calling and states that she spoke with the patient's sister and states that the patient has received all of her medications the last 8 mornings since she reported the issue. States that everything is fine and that she will be in there for the next 3 weeks- 2x a week. Extended her care to make sure this was done.   CB#: [phone]

## 2018-12-31 NOTE — Telephone Encounter (Signed)
LMTCB

## 2019-01-01 DIAGNOSIS — G301 Alzheimer's disease with late onset: Secondary | ICD-10-CM | POA: Diagnosis not present

## 2019-01-01 DIAGNOSIS — F0280 Dementia in other diseases classified elsewhere without behavioral disturbance: Secondary | ICD-10-CM | POA: Diagnosis not present

## 2019-01-01 DIAGNOSIS — I10 Essential (primary) hypertension: Secondary | ICD-10-CM | POA: Diagnosis not present

## 2019-01-01 DIAGNOSIS — M1990 Unspecified osteoarthritis, unspecified site: Secondary | ICD-10-CM | POA: Diagnosis not present

## 2019-01-01 DIAGNOSIS — R269 Unspecified abnormalities of gait and mobility: Secondary | ICD-10-CM | POA: Diagnosis not present

## 2019-01-01 DIAGNOSIS — M810 Age-related osteoporosis without current pathological fracture: Secondary | ICD-10-CM | POA: Diagnosis not present

## 2019-01-01 NOTE — Telephone Encounter (Signed)
NOTED

## 2019-01-06 DIAGNOSIS — M810 Age-related osteoporosis without current pathological fracture: Secondary | ICD-10-CM | POA: Diagnosis not present

## 2019-01-06 DIAGNOSIS — M1990 Unspecified osteoarthritis, unspecified site: Secondary | ICD-10-CM | POA: Diagnosis not present

## 2019-01-06 DIAGNOSIS — R269 Unspecified abnormalities of gait and mobility: Secondary | ICD-10-CM | POA: Diagnosis not present

## 2019-01-06 DIAGNOSIS — I10 Essential (primary) hypertension: Secondary | ICD-10-CM | POA: Diagnosis not present

## 2019-01-06 DIAGNOSIS — F0280 Dementia in other diseases classified elsewhere without behavioral disturbance: Secondary | ICD-10-CM | POA: Diagnosis not present

## 2019-01-06 DIAGNOSIS — G301 Alzheimer's disease with late onset: Secondary | ICD-10-CM | POA: Diagnosis not present

## 2019-01-08 ENCOUNTER — Telehealth: Payer: Self-pay | Admitting: Internal Medicine

## 2019-01-08 DIAGNOSIS — G301 Alzheimer's disease with late onset: Secondary | ICD-10-CM | POA: Diagnosis not present

## 2019-01-08 DIAGNOSIS — R269 Unspecified abnormalities of gait and mobility: Secondary | ICD-10-CM | POA: Diagnosis not present

## 2019-01-08 DIAGNOSIS — F0280 Dementia in other diseases classified elsewhere without behavioral disturbance: Secondary | ICD-10-CM | POA: Diagnosis not present

## 2019-01-08 DIAGNOSIS — I10 Essential (primary) hypertension: Secondary | ICD-10-CM | POA: Diagnosis not present

## 2019-01-08 DIAGNOSIS — M1990 Unspecified osteoarthritis, unspecified site: Secondary | ICD-10-CM | POA: Diagnosis not present

## 2019-01-08 DIAGNOSIS — M810 Age-related osteoporosis without current pathological fracture: Secondary | ICD-10-CM | POA: Diagnosis not present

## 2019-01-08 NOTE — Telephone Encounter (Signed)
Copied from [place] [phone]. Topic: Quick Communication - See Telephone Encounter >> Jan 08, 2019  2:28 PM Rutherford Nail, [place] wrote: CRM for notification. See Telephone encounter for: 01/08/19. Almyra Free- Case Manager with [facility] calling in regards to any update/information on the patient assistance program for her Namenda medication. Please advise.  CB#: [phone]

## 2019-01-13 DIAGNOSIS — I10 Essential (primary) hypertension: Secondary | ICD-10-CM | POA: Diagnosis not present

## 2019-01-13 DIAGNOSIS — F0280 Dementia in other diseases classified elsewhere without behavioral disturbance: Secondary | ICD-10-CM | POA: Diagnosis not present

## 2019-01-13 DIAGNOSIS — R269 Unspecified abnormalities of gait and mobility: Secondary | ICD-10-CM | POA: Diagnosis not present

## 2019-01-13 DIAGNOSIS — M1990 Unspecified osteoarthritis, unspecified site: Secondary | ICD-10-CM | POA: Diagnosis not present

## 2019-01-13 DIAGNOSIS — M810 Age-related osteoporosis without current pathological fracture: Secondary | ICD-10-CM | POA: Diagnosis not present

## 2019-01-13 DIAGNOSIS — G301 Alzheimer's disease with late onset: Secondary | ICD-10-CM | POA: Diagnosis not present

## 2019-01-13 NOTE — Telephone Encounter (Signed)
Pt assistance stated that they have been trying to reach pt with a few questions. Almyra Free and sister [name] notified and [name] will call and answer questions.

## 2019-01-13 NOTE — Telephone Encounter (Signed)
Deborah Myers with Deborah Myers is requesting a call back from [place] directly.   Deborah Myers says that  [facility] is her teaching the pt how to take Namenda medication. Deborah Myers states that she has been waiting to receive order back since she started services. She says that she want to note that she is almost complete with services.   CB: [phone]

## 2019-01-13 NOTE — Telephone Encounter (Signed)
Pt assistance stated that they have been trying to reach pt with a few questions. Deborah Myers and sister [name] notified and [name] will call and answer question

## 2019-01-15 DIAGNOSIS — G301 Alzheimer's disease with late onset: Secondary | ICD-10-CM | POA: Diagnosis not present

## 2019-01-15 DIAGNOSIS — R269 Unspecified abnormalities of gait and mobility: Secondary | ICD-10-CM | POA: Diagnosis not present

## 2019-01-15 DIAGNOSIS — M810 Age-related osteoporosis without current pathological fracture: Secondary | ICD-10-CM | POA: Diagnosis not present

## 2019-01-15 DIAGNOSIS — I10 Essential (primary) hypertension: Secondary | ICD-10-CM | POA: Diagnosis not present

## 2019-01-15 DIAGNOSIS — F0280 Dementia in other diseases classified elsewhere without behavioral disturbance: Secondary | ICD-10-CM | POA: Diagnosis not present

## 2019-01-15 DIAGNOSIS — M1990 Unspecified osteoarthritis, unspecified site: Secondary | ICD-10-CM | POA: Diagnosis not present

## 2019-01-21 ENCOUNTER — Ambulatory Visit (INDEPENDENT_AMBULATORY_CARE_PROVIDER_SITE_OTHER): Payer: Medicare Other | Admitting: Internal Medicine

## 2019-01-21 ENCOUNTER — Encounter: Payer: Self-pay | Admitting: Internal Medicine

## 2019-01-21 DIAGNOSIS — M810 Age-related osteoporosis without current pathological fracture: Secondary | ICD-10-CM | POA: Diagnosis not present

## 2019-01-21 DIAGNOSIS — G301 Alzheimer's disease with late onset: Secondary | ICD-10-CM

## 2019-01-21 DIAGNOSIS — F0280 Dementia in other diseases classified elsewhere without behavioral disturbance: Secondary | ICD-10-CM | POA: Diagnosis not present

## 2019-01-21 DIAGNOSIS — I10 Essential (primary) hypertension: Secondary | ICD-10-CM | POA: Diagnosis not present

## 2019-01-21 MED ORDER — MEMANTINE HCL ER 21 MG PO CP24: 21.0000 mg | ORAL_CAPSULE | Freq: Every day | ORAL | 3 refills | Status: DC

## 2019-01-21 MED ORDER — CARVEDILOL PHOSPHATE ER 20 MG PO CP24: 20.0000 mg | ORAL_CAPSULE | Freq: Every day | ORAL | 3 refills | Status: DC

## 2019-01-21 NOTE — Assessment & Plan Note (Signed)
Change to Coreg CR

## 2019-01-21 NOTE — Progress Notes (Signed)
Subjective:  Patient ID: Deborah Myers, female    DOB: 1936-05-07  Age: 83 y.o. MRN: [id number]  CC: No chief complaint on file.   HPI Deborah Myers presents for HTN, dementia, osteoporosis f/u Here w/sister Deborah Myers  Outpatient Medications Prior to Visit  Medication Sig Dispense Refill  . b complex vitamins tablet Take 1 tablet by mouth daily. 100 tablet 3  . carvedilol (COREG) 25 MG tablet Take 1 tablet (25 mg total) by mouth 2 (two) times daily with a meal. 60 tablet 11  . Cholecalciferol (VITAMIN D3) 50 MCG (2000 UT) capsule Take 1 capsule (2,000 Units total) by mouth daily. 100 capsule 3  . donepezil (ARICEPT) 10 MG tablet Take 1 tablet (10 mg total) by mouth at bedtime. 30 tablet 5  . memantine (NAMENDA) 10 MG tablet Take 1 tablet (10 mg total) by mouth 2 (two) times daily. 60 tablet 11   No facility-administered medications prior to visit.     ROS: Review of Systems  Constitutional: Negative for activity change, appetite change, chills, fatigue and unexpected weight change.  HENT: Negative for congestion, mouth sores and sinus pressure.   Eyes: Negative for visual disturbance.  Respiratory: Negative for cough and chest tightness.   Gastrointestinal: Negative for abdominal pain and nausea.  Genitourinary: Negative for difficulty urinating, frequency and vaginal pain.  Musculoskeletal: Negative for back pain and gait problem.  Skin: Negative for pallor and rash.  Neurological: Negative for dizziness, tremors, weakness, numbness and headaches.  Psychiatric/Behavioral: Positive for confusion and decreased concentration. Negative for sleep disturbance and suicidal ideas.    Objective:  BP 134/84 (BP Location: Left Arm, Patient Position: Sitting, Cuff Size: Normal)   Pulse (!) 51   Temp 97.9 F (36.6 C) (Oral)   Ht 5\' 7"  (1.702 m)   Wt 165 lb (74.8 kg)   SpO2 99%   BMI 25.84 kg/m   BP Readings from Last 3 Encounters:  01/21/19 134/84  10/22/18 136/88    05/09/18 (!) 148/88    Wt Readings from Last 3 Encounters:  01/21/19 165 lb (74.8 kg)  10/22/18 167 lb (75.8 kg)  05/09/18 167 lb (75.8 kg)    Physical Exam Constitutional:      General: She is not in acute distress.    Appearance: She is well-developed.  HENT:     Head: Normocephalic.     Right Ear: External ear normal.     Left Ear: External ear normal.     Nose: Nose normal.  Eyes:     General:        Right eye: No discharge.        Left eye: No discharge.     Conjunctiva/sclera: Conjunctivae normal.     Pupils: Pupils are equal, round, and reactive to light.  Neck:     Musculoskeletal: Normal range of motion and neck supple.     Thyroid: No thyromegaly.     Vascular: No JVD.     Trachea: No tracheal deviation.  Cardiovascular:     Rate and Rhythm: Normal rate and regular rhythm.     Heart sounds: Normal heart sounds.  Pulmonary:     Effort: No respiratory distress.     Breath sounds: No stridor. No wheezing.  Abdominal:     General: Bowel sounds are normal. There is no distension.     Palpations: Abdomen is soft. There is no mass.     Tenderness: There is no abdominal tenderness. There is no guarding or  rebound.  Musculoskeletal:        General: No tenderness.  Lymphadenopathy:     Cervical: No cervical adenopathy.  Skin:    Findings: No erythema or rash.  Neurological:     Mental Status: She is disoriented.     Cranial Nerves: No cranial nerve deficit.     Motor: No abnormal muscle tone.     Coordination: Coordination normal.     Deep Tendon Reflexes: Reflexes normal.  Psychiatric:        Behavior: Behavior normal.        Thought Content: Thought content normal.        Judgment: Judgment normal.     Lab Results  Component Value Date   WBC 5.1 04/01/2018   HGB 14.5 04/01/2018   HCT 43.8 04/01/2018   PLT 322.0 04/01/2018   GLUCOSE 97 04/01/2018   CHOL 283 (H) 04/01/2018   TRIG 78.0 04/01/2018   HDL 61.60 04/01/2018   LDLCALC 206 (H) 04/01/2018    ALT 6 04/01/2018   AST 11 04/01/2018   NA 140 04/01/2018   K 3.0 (L) 04/01/2018   CL 103 04/01/2018   CREATININE 0.84 04/01/2018   BUN 14 04/01/2018   CO2 30 04/01/2018   TSH 4.55 (H) 04/01/2018    Ct Head Wo Contrast  Result Date: 04/09/2018 CLINICAL DATA:  Altered level of consciousness EXAM: CT HEAD WITHOUT CONTRAST TECHNIQUE: Contiguous axial images were obtained from the base of the skull through the vertex without intravenous contrast. COMPARISON:  None. FINDINGS: Brain: No evidence of acute infarction, hemorrhage, extra-axial collection, ventriculomegaly, or mass effect. Generalized cerebral atrophy. Periventricular white matter low attenuation likely secondary to microangiopathy. Vascular: Cerebrovascular atherosclerotic calcifications are noted. Skull: Negative for fracture or focal lesion. Sinuses/Orbits: Visualized portions of the orbits are unremarkable. Visualized portions of the paranasal sinuses and mastoid air cells are unremarkable. Other: None. IMPRESSION: No acute intracranial pathology. Electronically Signed   By: Kathreen Devoid   On: 04/09/2018 14:57    Assessment & Plan:   There are no diagnoses linked to this encounter.   No orders of the defined types were placed in this encounter.    Follow-up: No follow-ups on file.  Walker Kehr, MD

## 2019-01-21 NOTE — Assessment & Plan Note (Signed)
Vit D 

## 2019-01-21 NOTE — Assessment & Plan Note (Signed)
Aricept. Namzaric - too $$$ Change to [facility] ER

## 2019-02-18 ENCOUNTER — Other Ambulatory Visit: Payer: Self-pay | Admitting: Internal Medicine

## 2019-03-12 IMAGING — CT CT HEAD W/O CM
3 series · 15 of 33 positions shown, 18 images · non-contrast
Comparison: None.

CLINICAL DATA: Altered level of consciousness

EXAM:
CT HEAD WITHOUT CONTRAST
TECHNIQUE: Contiguous axial images were obtained from the base of the skull
through the vertex without intravenous contrast.

[Series 2: head 5.0 h37s · axial · 0.41mm/px · z∈[+156,+276]mm · 7 of 30 slices shown, 9 images]
[im 3/30  soft-tissue]
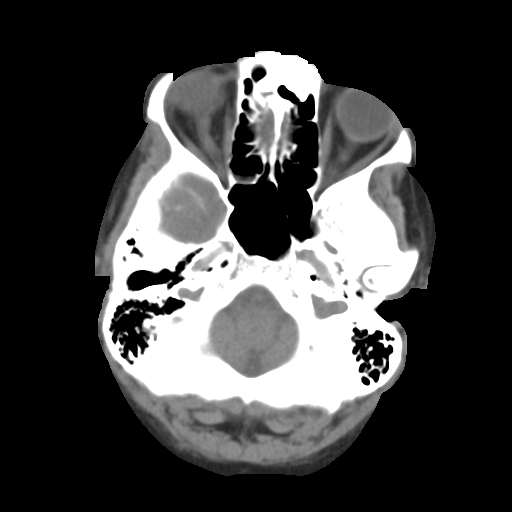
[im 3/30  bone]
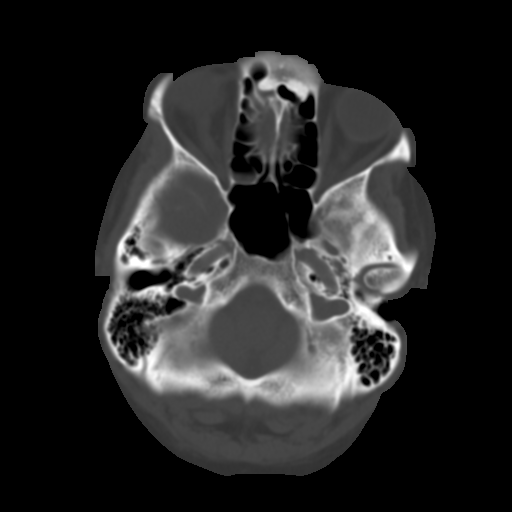
[im 7/30  bone]
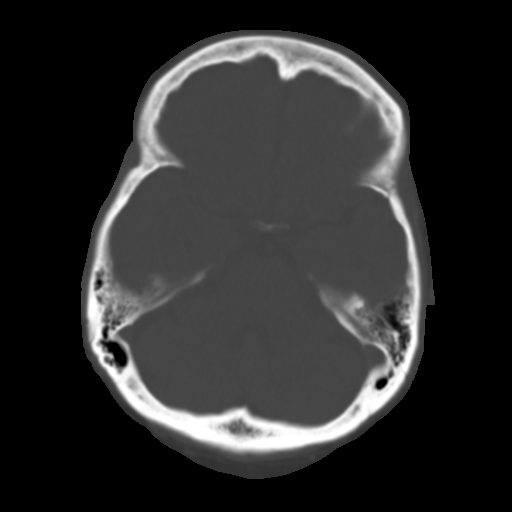
[im 12/30  bone]
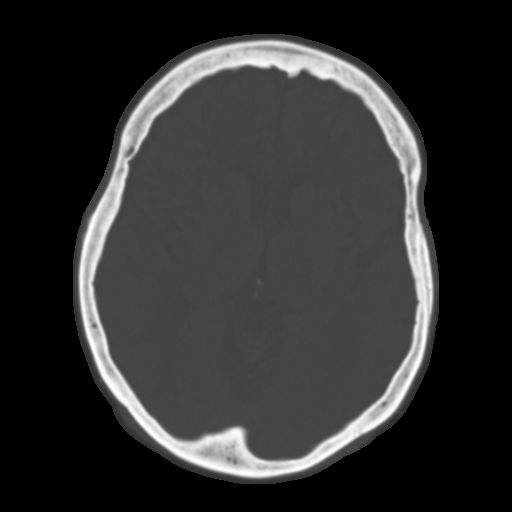
[im 16/30  bone]
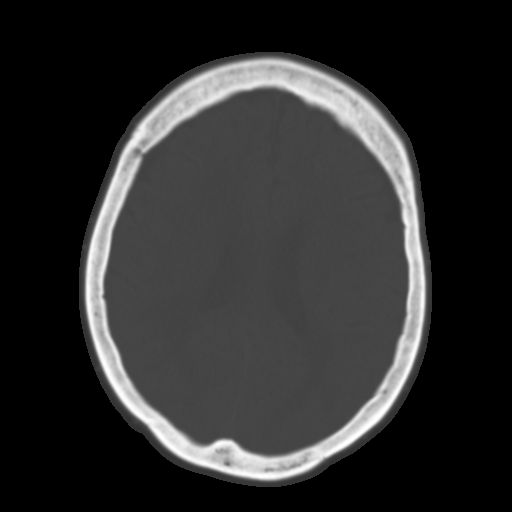
[im 18/30  soft-tissue]
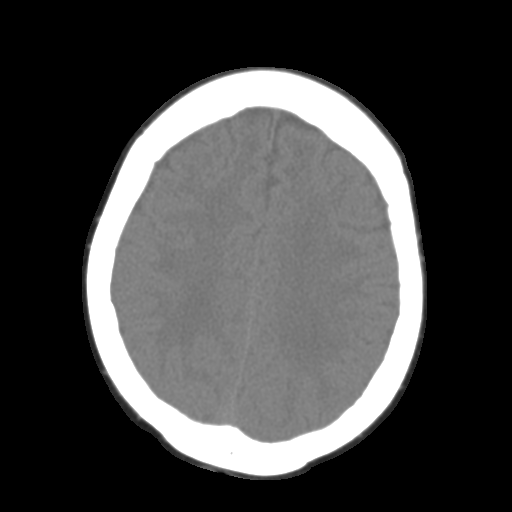
[im 18/30  bone]
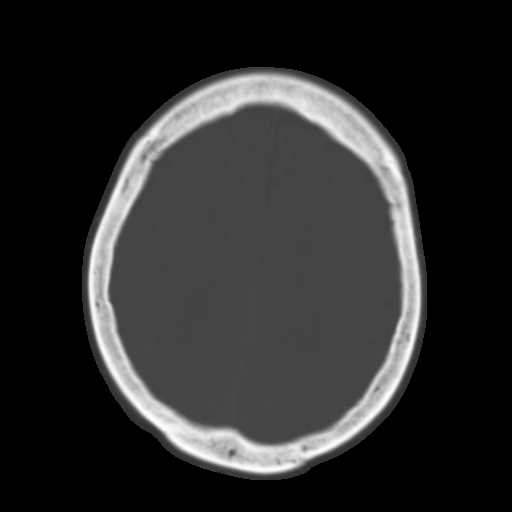
[im 23/30  bone]
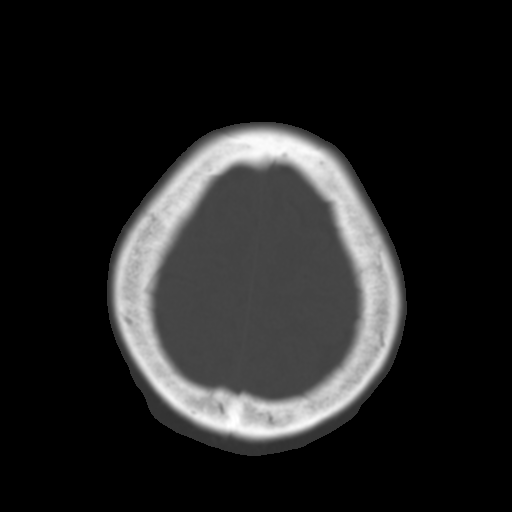
[im 27/30  bone]
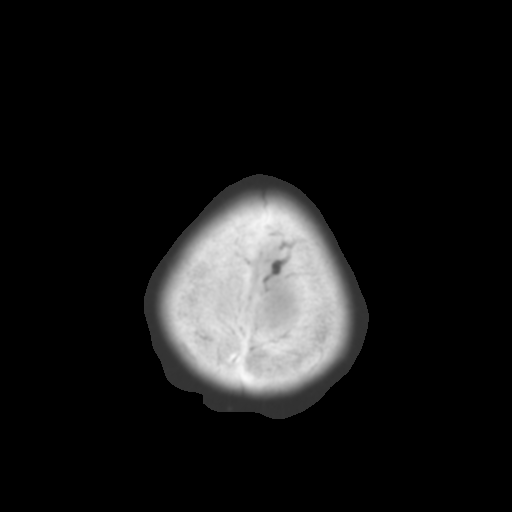

[Series 4: head 3.0 mpr cor · coronal · 0.30mm/px · 3 of 62 slices shown]
[im 13/62  bone]
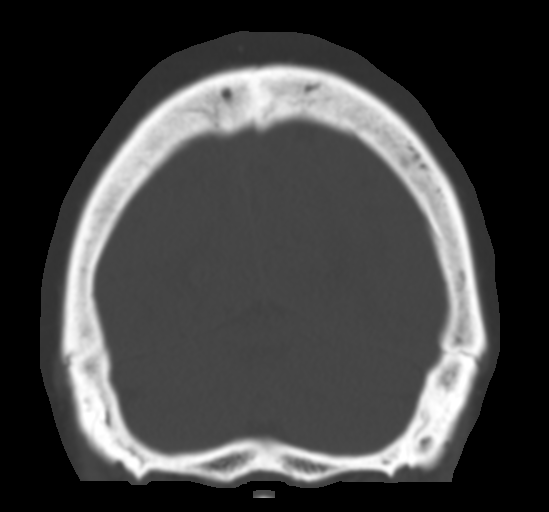
[im 25/62  bone]
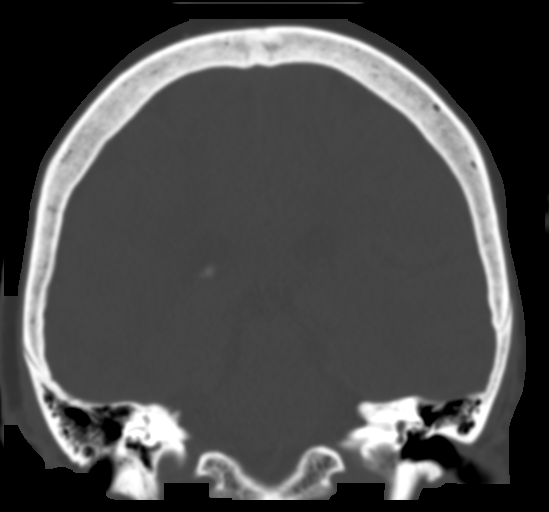
[im 37/62  bone]
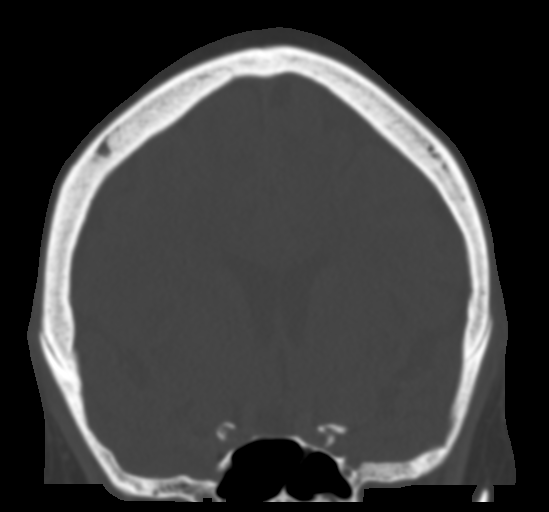

[Series 5: head 3.0 mpr sag · sagittal · 0.29mm/px · 5 of 54 slices shown, 6 images]
[im 18/54  bone]
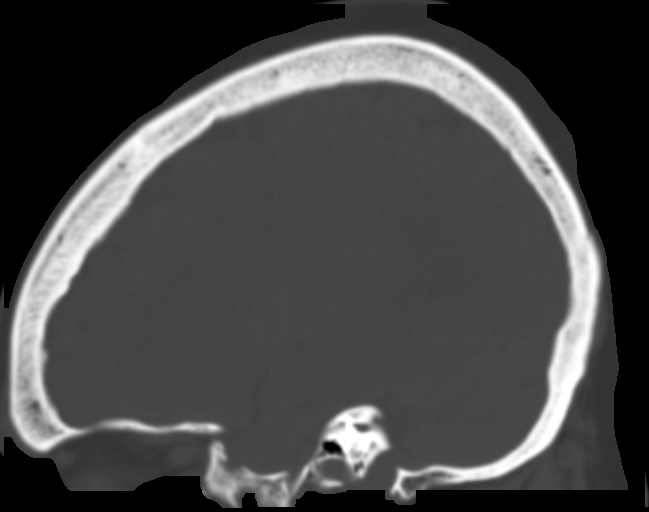
[im 23/54  bone]
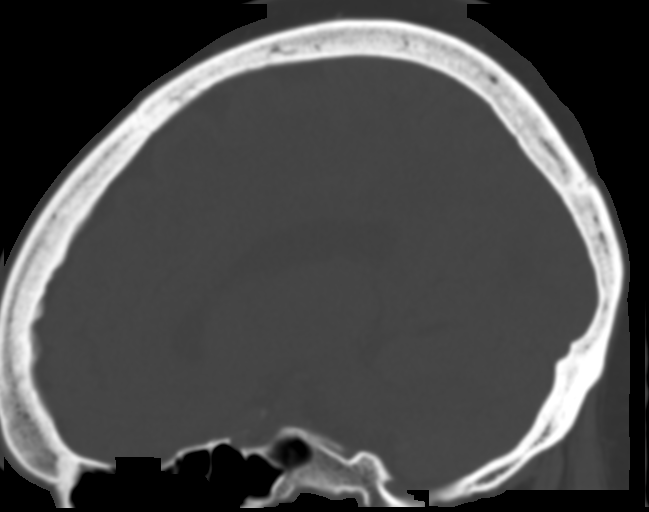
[im 27/54  soft-tissue]
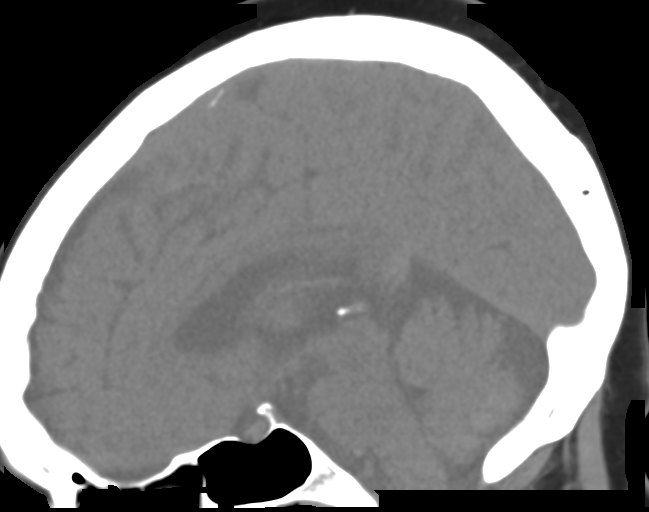
[im 27/54  bone]
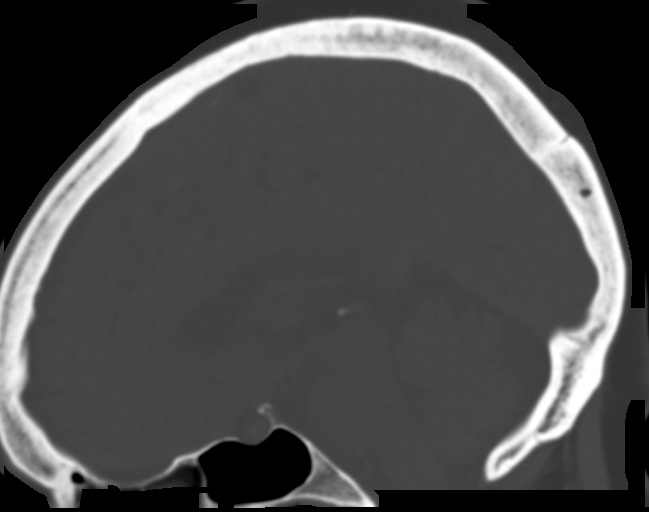
[im 31/54  bone]
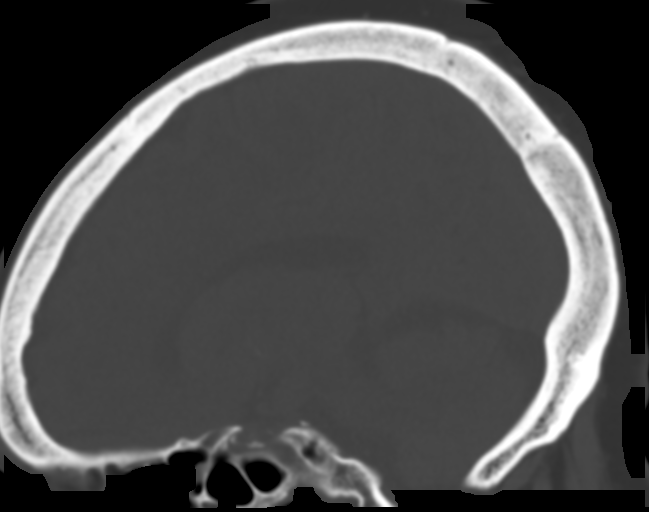
[im 36/54  bone]
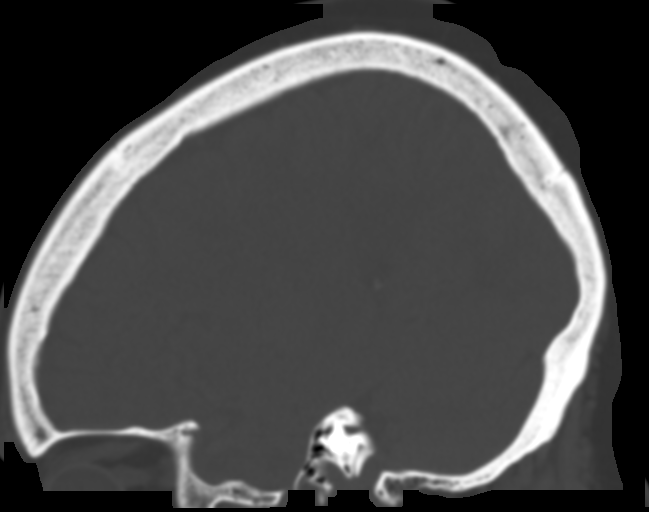

[15 of 33 positions shown; findings below may reference images not displayed]

FINDINGS: Brain: No evidence of acute infarction, hemorrhage, extra-axial
collection, ventriculomegaly, or mass effect. Generalized cerebral
atrophy. Periventricular white matter low attenuation likely
secondary to microangiopathy.

Vascular: Cerebrovascular atherosclerotic calcifications are noted.

Skull: Negative for fracture or focal lesion.

Sinuses/Orbits: Visualized portions of the orbits are unremarkable.
Visualized portions of the paranasal sinuses and mastoid air cells
are unremarkable.

Other: None.
IMPRESSION: No acute intracranial pathology.

## 2019-05-26 ENCOUNTER — Other Ambulatory Visit (INDEPENDENT_AMBULATORY_CARE_PROVIDER_SITE_OTHER): Payer: Medicare Other

## 2019-05-26 ENCOUNTER — Ambulatory Visit (INDEPENDENT_AMBULATORY_CARE_PROVIDER_SITE_OTHER): Payer: Medicare Other | Admitting: Internal Medicine

## 2019-05-26 ENCOUNTER — Encounter: Payer: Self-pay | Admitting: Internal Medicine

## 2019-05-26 ENCOUNTER — Other Ambulatory Visit: Payer: Self-pay

## 2019-05-26 DIAGNOSIS — E785 Hyperlipidemia, unspecified: Secondary | ICD-10-CM | POA: Diagnosis not present

## 2019-05-26 DIAGNOSIS — G301 Alzheimer's disease with late onset: Secondary | ICD-10-CM

## 2019-05-26 DIAGNOSIS — I10 Essential (primary) hypertension: Secondary | ICD-10-CM

## 2019-05-26 DIAGNOSIS — E876 Hypokalemia: Secondary | ICD-10-CM | POA: Diagnosis not present

## 2019-05-26 DIAGNOSIS — F0280 Dementia in other diseases classified elsewhere without behavioral disturbance: Secondary | ICD-10-CM

## 2019-05-26 DIAGNOSIS — R7989 Other specified abnormal findings of blood chemistry: Secondary | ICD-10-CM | POA: Diagnosis not present

## 2019-05-26 LAB — CBC WITH DIFFERENTIAL/PLATELET
Basophils Absolute: 0.1 10*3/uL (ref 0.0–0.1)
Basophils Relative: 1.3 % (ref 0.0–3.0)
Eosinophils Absolute: 0.6 10*3/uL (ref 0.0–0.7)
Eosinophils Relative: 11.2 % — ABNORMAL HIGH (ref 0.0–5.0)
HCT: 41.2 % (ref 36.0–46.0)
Hemoglobin: 13.5 g/dL (ref 12.0–15.0)
Lymphocytes Relative: 31.1 % (ref 12.0–46.0)
Lymphs Abs: 1.8 10*3/uL (ref 0.7–4.0)
MCHC: 32.7 g/dL (ref 30.0–36.0)
MCV: 85.9 fl (ref 78.0–100.0)
Monocytes Absolute: 0.7 10*3/uL (ref 0.1–1.0)
Monocytes Relative: 12 % (ref 3.0–12.0)
Neutro Abs: 2.6 10*3/uL (ref 1.4–7.7)
Neutrophils Relative %: 44.4 % (ref 43.0–77.0)
Platelets: 291 10*3/uL (ref 150.0–400.0)
RBC: 4.8 Mil/uL (ref 3.87–5.11)
RDW: 13.3 % (ref 11.5–15.5)
WBC: 5.8 10*3/uL (ref 4.0–10.5)

## 2019-05-26 LAB — HEPATIC FUNCTION PANEL
ALT: 6 U/L (ref 0–35)
AST: 11 U/L (ref 0–37)
Albumin: 4 g/dL (ref 3.5–5.2)
Alkaline Phosphatase: 69 U/L (ref 39–117)
Bilirubin, Direct: 0.1 mg/dL (ref 0.0–0.3)
Total Bilirubin: 0.6 mg/dL (ref 0.2–1.2)
Total Protein: 8 g/dL (ref 6.0–8.3)

## 2019-05-26 LAB — BASIC METABOLIC PANEL
BUN: 17 mg/dL (ref 6–23)
CO2: 30 mEq/L (ref 19–32)
Calcium: 9.4 mg/dL (ref 8.4–10.5)
Chloride: 104 mEq/L (ref 96–112)
Creatinine, Ser: 0.95 mg/dL (ref 0.40–1.20)
GFR: 68.27 mL/min (ref >60.00)
Glucose, Bld: 89 mg/dL (ref 70–99)
Potassium: 3.5 mEq/L (ref 3.5–5.1)
Sodium: 142 mEq/L (ref 135–145)

## 2019-05-26 LAB — TSH: TSH: 5.99 u[IU]/mL — ABNORMAL HIGH (ref 0.35–4.50)

## 2019-05-26 LAB — T4, FREE: Free T4: 0.74 ng/dL (ref 0.60–1.60)

## 2019-05-26 NOTE — Assessment & Plan Note (Signed)
Aricept and Namenda ER

## 2019-05-26 NOTE — Assessment & Plan Note (Signed)
Labs

## 2019-05-26 NOTE — Assessment & Plan Note (Signed)
Off Rx 

## 2019-05-26 NOTE — Patient Instructions (Signed)
If you have medicare related insurance (such as traditional Medicare, Blue Cross Medicare, United HealthCare Medicare, or similar), Please make an appointment at the scheduling desk with Jill, the Wellness Health Coach, for your Wellness visit in this office, which is a benefit with your insurance.  

## 2019-05-26 NOTE — Progress Notes (Signed)
Subjective:  Patient ID: Deborah Myers, female    DOB: 11-15-1936  Age: 83 y.o. MRN: [id number]  CC: No chief complaint on file.   HPI Deborah Myers presents for dementia - taking Namenda, Aricept. F/u HTN, OA  Outpatient Medications Prior to Visit  Medication Sig Dispense Refill  . b complex vitamins tablet Take 1 tablet by mouth daily. 100 tablet 3  . carvedilol (COREG CR) 20 MG 24 hr capsule Take 1 capsule (20 mg total) by mouth daily. 90 capsule 3  . carvedilol (COREG) 25 MG tablet TAKE 1 TABLET(25 MG) BY MOUTH TWICE DAILY WITH A MEAL 60 tablet 11  . Cholecalciferol (VITAMIN D3) 50 MCG (2000 UT) capsule Take 1 capsule (2,000 Units total) by mouth daily. 100 capsule 3  . donepezil (ARICEPT) 10 MG tablet Take 1 tablet (10 mg total) by mouth at bedtime. 30 tablet 5  . memantine (NAMENDA XR) 21 MG CP24 24 hr capsule Take 1 capsule (21 mg total) by mouth daily. 90 capsule 3   No facility-administered medications prior to visit.     ROS: Review of Systems  Constitutional: Positive for fatigue. Negative for activity change, appetite change, chills and unexpected weight change.  HENT: Negative for congestion, mouth sores and sinus pressure.   Eyes: Negative for visual disturbance.  Respiratory: Negative for cough and chest tightness.   Gastrointestinal: Negative for abdominal pain and nausea.  Genitourinary: Negative for difficulty urinating, frequency and vaginal pain.  Musculoskeletal: Negative for back pain and gait problem.  Skin: Negative for pallor and rash.  Neurological: Negative for dizziness, tremors, weakness, numbness and headaches.  Psychiatric/Behavioral: Positive for confusion and decreased concentration. Negative for sleep disturbance and suicidal ideas.    Objective:  BP 140/86 (BP Location: Right Arm, Patient Position: Sitting, Cuff Size: Normal)   Pulse 64   Temp 98.4 F (36.9 C) (Oral)   Ht 5\' 7"  (1.702 m)   Wt 171 lb (77.6 kg)   SpO2 98%   BMI  26.78 kg/m   BP Readings from Last 3 Encounters:  05/26/19 140/86  01/21/19 134/84  10/22/18 136/88    Wt Readings from Last 3 Encounters:  05/26/19 171 lb (77.6 kg)  01/21/19 165 lb (74.8 kg)  10/22/18 167 lb (75.8 kg)    Physical Exam Constitutional:      General: She is not in acute distress.    Appearance: She is well-developed.  HENT:     Head: Normocephalic.     Right Ear: External ear normal.     Left Ear: External ear normal.     Nose: Nose normal.  Eyes:     General:        Right eye: No discharge.        Left eye: No discharge.     Conjunctiva/sclera: Conjunctivae normal.     Pupils: Pupils are equal, round, and reactive to light.  Neck:     Musculoskeletal: Normal range of motion and neck supple.     Thyroid: No thyromegaly.     Vascular: No JVD.     Trachea: No tracheal deviation.  Cardiovascular:     Rate and Rhythm: Normal rate and regular rhythm.     Heart sounds: Normal heart sounds.  Pulmonary:     Effort: No respiratory distress.     Breath sounds: No stridor. No wheezing.  Abdominal:     General: Bowel sounds are normal. There is no distension.     Palpations: Abdomen is soft.  There is no mass.     Tenderness: There is no abdominal tenderness. There is no guarding or rebound.  Musculoskeletal:        General: No tenderness.  Lymphadenopathy:     Cervical: No cervical adenopathy.  Skin:    Findings: No erythema or rash.  Neurological:     Mental Status: She is disoriented.     Cranial Nerves: No cranial nerve deficit.     Motor: No abnormal muscle tone.     Coordination: Coordination normal.     Deep Tendon Reflexes: Reflexes normal.  Psychiatric:        Behavior: Behavior normal.        Thought Content: Thought content normal.        Judgment: Judgment normal.     Lab Results  Component Value Date   WBC 5.1 04/01/2018   HGB 14.5 04/01/2018   HCT 43.8 04/01/2018   PLT 322.0 04/01/2018   GLUCOSE 97 04/01/2018   CHOL 283 (H)  04/01/2018   TRIG 78.0 04/01/2018   HDL 61.60 04/01/2018   LDLCALC 206 (H) 04/01/2018   ALT 6 04/01/2018   AST 11 04/01/2018   NA 140 04/01/2018   K 3.0 (L) 04/01/2018   CL 103 04/01/2018   CREATININE 0.84 04/01/2018   BUN 14 04/01/2018   CO2 30 04/01/2018   TSH 4.55 (H) 04/01/2018    Ct Head Wo Contrast  Result Date: 04/09/2018 CLINICAL DATA:  Altered level of consciousness EXAM: CT HEAD WITHOUT CONTRAST TECHNIQUE: Contiguous axial images were obtained from the base of the skull through the vertex without intravenous contrast. COMPARISON:  None. FINDINGS: Brain: No evidence of acute infarction, hemorrhage, extra-axial collection, ventriculomegaly, or mass effect. Generalized cerebral atrophy. Periventricular white matter low attenuation likely secondary to microangiopathy. Vascular: Cerebrovascular atherosclerotic calcifications are noted. Skull: Negative for fracture or focal lesion. Sinuses/Orbits: Visualized portions of the orbits are unremarkable. Visualized portions of the paranasal sinuses and mastoid air cells are unremarkable. Other: None. IMPRESSION: No acute intracranial pathology. Electronically Signed   By: Kathreen Devoid   On: 04/09/2018 14:57    Assessment & Plan:   There are no diagnoses linked to this encounter.   No orders of the defined types were placed in this encounter.    Follow-up: No follow-ups on file.  Walker Kehr, MD

## 2019-06-06 ENCOUNTER — Other Ambulatory Visit: Payer: Self-pay | Admitting: Internal Medicine

## 2019-06-16 ENCOUNTER — Telehealth: Payer: Self-pay | Admitting: *Deleted

## 2019-06-16 NOTE — Telephone Encounter (Signed)
Namenda XR 21 mg # 90 received from patient assistance. Medication is upfront for p/u. Patient's sister informed.

## 2019-07-17 ENCOUNTER — Other Ambulatory Visit: Payer: Self-pay

## 2019-07-17 ENCOUNTER — Encounter: Payer: Self-pay | Admitting: Podiatry

## 2019-07-17 ENCOUNTER — Ambulatory Visit (INDEPENDENT_AMBULATORY_CARE_PROVIDER_SITE_OTHER): Payer: Medicare Other | Admitting: Podiatry

## 2019-07-17 VITALS — BP 160/100 | HR 82

## 2019-07-17 DIAGNOSIS — M2011 Hallux valgus (acquired), right foot: Secondary | ICD-10-CM

## 2019-07-17 DIAGNOSIS — M79675 Pain in left toe(s): Secondary | ICD-10-CM

## 2019-07-17 DIAGNOSIS — M2042 Other hammer toe(s) (acquired), left foot: Secondary | ICD-10-CM | POA: Diagnosis not present

## 2019-07-17 DIAGNOSIS — M79674 Pain in right toe(s): Secondary | ICD-10-CM | POA: Diagnosis not present

## 2019-07-17 DIAGNOSIS — M2041 Other hammer toe(s) (acquired), right foot: Secondary | ICD-10-CM

## 2019-07-17 DIAGNOSIS — B351 Tinea unguium: Secondary | ICD-10-CM | POA: Diagnosis not present

## 2019-07-17 DIAGNOSIS — M2012 Hallux valgus (acquired), left foot: Secondary | ICD-10-CM

## 2019-07-17 NOTE — Patient Instructions (Signed)

## 2019-07-26 NOTE — Progress Notes (Signed)
Subjective: Deborah Myers presents today referred by Plotnikov, Evie Lacks, MD with cc of painful, discolored, thick toenails x 2 months which interfere with daily activities.  Pain is aggravated when wearing enclosed shoe gear. She used to trim them herself, but is no longer able to do so.   Past Medical History:  Diagnosis Date  . Arthritis   . Cataract   . Cecum mass 2008   Benign   . HTN (hypertension)   . Hyperlipidemia   . Osteoporosis    no per pt     Patient Active Problem List   Diagnosis Date Noted  . Abnormal TSH 05/26/2019  . Urinary tract infection 05/09/2018  . Alzheimer disease ([place]) 04/01/2018  . Insomnia 09/11/2016  . Arthritis of right lower extremity 03/10/2015  . Edema 12/08/2014  . Pain in joint, lower leg 01/04/2014  . 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 Intermediate uveitis 05/10/2013  . Hypokalemia 03/11/2013  . PHN (postherpetic neuralgia) 10/21/2012  . Well adult exam 03/19/2012  . Hx of adenomatous colonic polyps 04/17/2011  . Thyrotoxicosis 03/24/2009  . Dyslipidemia 07/24/2007  . Essential hypertension 07/24/2007  . Osteoporosis 07/24/2007     Past Surgical History:  Procedure Laterality Date  . COLECTOMY  2008   Benign mass - laparoscopic right  . COLONOSCOPY        Current Outpatient Medications:  .  b complex vitamins tablet, Take 1 tablet by mouth daily., Disp: 100 tablet, Rfl: 3 .  carvedilol (COREG CR) 20 MG 24 hr capsule, Take 1 capsule (20 mg total) by mouth daily., Disp: 90 capsule, Rfl: 3 .  carvedilol (COREG) 25 MG tablet, TAKE 1 TABLET(25 MG) BY MOUTH TWICE DAILY WITH A MEAL, Disp: 60 tablet, Rfl: 11 .  Cholecalciferol (VITAMIN D3) 50 MCG (2000 UT) capsule, Take 1 capsule (2,000 Units total) by mouth daily., Disp: 100 capsule, Rfl: 3 .  donepezil (ARICEPT) 10 MG tablet, TAKE 1 TABLET(10 MG) BY MOUTH AT BEDTIME, Disp: 30 tablet, Rfl: 5 .  memantine (NAMENDA XR) 21 MG CP24 24 hr capsule, Take 1 capsule (21 mg total) by mouth daily.,  Disp: 90 capsule, Rfl: 3   Allergies  Allergen Reactions  . Tiazac [Diltiazem]     edema     Social History   Occupational History  . Occupation: RETIRED    Employer: [employer]  Tobacco Use  . Smoking status: Never Smoker  . Smokeless tobacco: Never Used  Substance and Sexual Activity  . Alcohol use: No    Alcohol/week: 4.0 standard drinks    Types: 4 Standard drinks or equivalent per week    Comment: 03/06/2016 states she does not drink ETOH   . Drug use: No  . Sexual activity: Not Currently     Family History  Problem Relation Age of Onset  . Coronary artery disease Mother   . Diabetes Mother   . Heart disease Mother        CHF  . Coronary artery disease Father   . Diabetes Father   . Heart disease Father   . Diabetes Sister   . Diabetes Brother   . Cancer Neg Hx   . COPD Neg Hx   . Colon cancer Neg Hx   . Esophageal cancer Neg Hx   . Stomach cancer Neg Hx   . Rectal cancer Neg Hx      Immunization History  Administered Date(s) Administered  . Influenza, High Dose Seasonal PF 09/11/2016, 08/13/2017, 10/22/2018  . Influenza,inj,Quad PF,6+ Mos 11/03/2015  .  Pneumococcal Conjugate-13 01/04/2014  . Pneumococcal Polysaccharide-23 03/24/2009, 04/17/2011  . Td 03/24/2009  . Zoster 06/11/2013     Review of systems: Positive Findings in bold print.  Constitutional:  chills, fatigue, fever, sweats, weight change Communication: Optometrist, sign Ecologist, hand writing, iPad/Android device Head: headaches, head injury Eyes: changes in vision, eye pain, glaucoma, cataracts, macular degeneration, diplopia, glare,  light sensitivity, eyeglasses or contacts, blindness Ears nose mouth throat: hearing impaired, hearing aids,  ringing in ears, deaf, sign language,  vertigo,   nosebleeds,  rhinitis,  cold sores, snoring, swollen glands Cardiovascular: HTN, edema, arrhythmia, pacemaker in place, defibrillator in place, chest pain/tightness, chronic anticoagulation,  blood clot, heart failure, MI Peripheral Vascular: leg cramps, varicose veins, blood clots, lymphedema, varicosities Respiratory:  difficulty breathing, denies congestion, SOB, wheezing, cough, emphysema Gastrointestinal: change in appetite or weight, abdominal pain, constipation, diarrhea, nausea, vomiting, vomiting blood, change in bowel habits, abdominal pain, jaundice, rectal bleeding, hemorrhoids, GERD Genitourinary:  nocturia,  pain on urination, polyuria,  blood in urine, Foley catheter, urinary urgency, ESRD on hemodialysis Musculoskeletal: amputation, cramping, stiff joints, painful joints, decreased joint motion, fractures, OA, gout, hemiplegia, paraplegia, uses cane, wheelchair bound, uses walker, uses rollator Skin: +changes in toenails, color change, dryness, itching, mole changes,  rash, wound(s) Neurological: headaches, numbness in feet, paresthesias in feet, burning in feet, fainting,  seizures, change in speech. denies headaches, memory problems/poor historian, cerebral palsy, weakness, paralysis, CVA, TIA Endocrine: diabetes, hypothyroidism, hyperthyroidism,  goiter, dry mouth, flushing, heat intolerance,  cold intolerance,  excessive thirst, denies polyuria,  nocturia Hematological:  easy bleeding, excessive bleeding, easy bruising, enlarged lymph nodes, on long term blood thinner, history of past transusions Allergy/immunological:  hives, eczema, frequent infections, multiple drug allergies, seasonal allergies, transplant recipient, multiple food allergies Psychiatric:  anxiety, depression, mood disorder, suicidal ideations, hallucinations, insomnia  Objective: Vitals:   07/17/19 1137  BP: (!) 160/100  Pulse: 82    Vascular Examination: Capillary refill time immediate  x 10 digits.  Dorsalis pedis pulses palpable right LE; decreased LLE.   Posterior tibial pulses faintly palpable b/l.   No digital hair x 10 digits.  Skin temperature gradient WNL b/l  Dermatological  Examination: Skin with normal turgor, texture and tone b/l.  Toenails 1-5 b/l discolored, thick, dystrophic with subungual debris and pain with palpation to nailbeds due to thickness of nails.  Musculoskeletal: Muscle strength 5/5 to all LE muscle groups.  HAV with bunion b/l.  Hammertoes 2nd b/l.  Neurological: Patient unable to comply with commands of neurological examination due to cognition status, but does respond to external noxious stimuli.  Assessment: 1. Painful onychomycosis toenails 1-5 b/l   Plan: 1. Discussed onychomycosis and treatment options.  Literature dispensed on today. 2. Toenails 1-5 b/l were debrided in length and girth without iatrogenic bleeding. 3. Patient to continue soft, supportive shoe gear daily. 4. Patient to report any pedal injuries to medical professional immediately. 5. Follow up 3 months.  6. Patient/POA to call should there be a concern in the interim.

## 2019-08-18 ENCOUNTER — Telehealth: Payer: Self-pay | Admitting: Internal Medicine

## 2019-08-18 NOTE — Telephone Encounter (Signed)
Sister [name] calling to advise the carvedilol (COREG) 25 MG tablet  30 day was   $170.00 for a month supply.  She is calling to ask if this med can be put on the same plan as the memantine (NAMENDA XR) 21 MG CP24 24 hr capsule?  If not, they are going to have to make a change, because this is too much and they cannot pay this every month.  She did take the discount card and it was still this much.

## 2019-08-19 NOTE — Telephone Encounter (Signed)
Sister notified that each medicaiton would need a different patient assistant program, got the carvedilol CR it looks like its just a form that they have to fill out but in the mean time they can go through [facility] and get a discounted rate.

## 2019-08-27 DIAGNOSIS — Z961 Presence of intraocular lens: Secondary | ICD-10-CM | POA: Diagnosis not present

## 2019-08-27 DIAGNOSIS — Z0100 Encounter for examination of eyes and vision without abnormal findings: Secondary | ICD-10-CM | POA: Diagnosis not present

## 2019-09-25 ENCOUNTER — Other Ambulatory Visit: Payer: Self-pay

## 2019-09-25 MED ORDER — MEMANTINE HCL ER 21 MG PO CP24: 21.0000 mg | ORAL_CAPSULE | Freq: Every day | ORAL | 3 refills | Status: DC

## 2019-09-28 ENCOUNTER — Encounter: Payer: Self-pay | Admitting: Internal Medicine

## 2019-09-28 ENCOUNTER — Ambulatory Visit (INDEPENDENT_AMBULATORY_CARE_PROVIDER_SITE_OTHER): Payer: Medicare Other | Admitting: Internal Medicine

## 2019-09-28 ENCOUNTER — Other Ambulatory Visit (INDEPENDENT_AMBULATORY_CARE_PROVIDER_SITE_OTHER): Payer: Medicare Other

## 2019-09-28 ENCOUNTER — Other Ambulatory Visit: Payer: Self-pay

## 2019-09-28 DIAGNOSIS — R7989 Other specified abnormal findings of blood chemistry: Secondary | ICD-10-CM | POA: Diagnosis not present

## 2019-09-28 DIAGNOSIS — F0280 Dementia in other diseases classified elsewhere without behavioral disturbance: Secondary | ICD-10-CM

## 2019-09-28 DIAGNOSIS — I10 Essential (primary) hypertension: Secondary | ICD-10-CM

## 2019-09-28 DIAGNOSIS — R635 Abnormal weight gain: Secondary | ICD-10-CM | POA: Diagnosis not present

## 2019-09-28 DIAGNOSIS — G301 Alzheimer's disease with late onset: Secondary | ICD-10-CM | POA: Diagnosis not present

## 2019-09-28 LAB — BASIC METABOLIC PANEL
BUN: 14 mg/dL (ref 6–23)
CO2: 30 mEq/L (ref 19–32)
Calcium: 9.6 mg/dL (ref 8.4–10.5)
Chloride: 102 mEq/L (ref 96–112)
Creatinine, Ser: 0.96 mg/dL (ref 0.40–1.20)
GFR: 67.39 mL/min (ref >60.00)
Glucose, Bld: 83 mg/dL (ref 70–99)
Potassium: 3.6 mEq/L (ref 3.5–5.1)
Sodium: 139 mEq/L (ref 135–145)

## 2019-09-28 LAB — T4, FREE: Free T4: 0.83 ng/dL (ref 0.60–1.60)

## 2019-09-28 LAB — TSH: TSH: 5.62 u[IU]/mL — ABNORMAL HIGH (ref 0.35–4.50)

## 2019-09-28 MED ORDER — CARVEDILOL PHOSPHATE ER 20 MG PO CP24: 20.0000 mg | ORAL_CAPSULE | Freq: Every day | ORAL | 3 refills | Status: DC

## 2019-09-28 MED ORDER — DONEPEZIL HCL 10 MG PO TABS: ORAL_TABLET | ORAL | 3 refills | Status: DC

## 2019-09-28 NOTE — Assessment & Plan Note (Signed)
Aricept, Namenda

## 2019-09-28 NOTE — Patient Instructions (Signed)
If you have medicare related insurance (such as traditional Medicare, Blue Cross Medicare, United HealthCare Medicare, or similar), Please make an appointment at the scheduling desk with Jill, the Wellness Health Coach, for your Wellness visit in this office, which is a benefit with your insurance.  

## 2019-09-28 NOTE — Assessment & Plan Note (Signed)
TSH, FT4 

## 2019-09-28 NOTE — Progress Notes (Signed)
Subjective:  Patient ID: Deborah Myers, female    DOB: [birth year]  Age: 83 y.o. MRN: [id number]  CC: No chief complaint on file.   HPI  Deborah Myers presents for HTN, dementia, dyslipidemia f/u Comes w/sister Deborah Myers  Outpatient Medications Prior to Visit  Medication Sig Dispense Refill  . b complex vitamins tablet Take 1 tablet by mouth daily. 100 tablet 3  . carvedilol (COREG CR) 20 MG 24 hr capsule Take 1 capsule (20 mg total) by mouth daily. 90 capsule 3  . carvedilol (COREG) 25 MG tablet TAKE 1 TABLET(25 MG) BY MOUTH TWICE DAILY WITH A MEAL 60 tablet 11  . Cholecalciferol (VITAMIN D3) 50 MCG (2000 UT) capsule Take 1 capsule (2,000 Units total) by mouth daily. 100 capsule 3  . donepezil (ARICEPT) 10 MG tablet TAKE 1 TABLET(10 MG) BY MOUTH AT BEDTIME 30 tablet 5  . memantine (NAMENDA XR) 21 MG CP24 24 hr capsule Take 1 capsule (21 mg total) by mouth daily. 90 capsule 3   No facility-administered medications prior to visit.     ROS: Review of Systems  Constitutional: Positive for unexpected weight change. Negative for activity change, appetite change, chills and fatigue.  HENT: Negative for congestion, mouth sores and sinus pressure.   Eyes: Negative for visual disturbance.  Respiratory: Negative for cough and chest tightness.   Gastrointestinal: Negative for abdominal pain and nausea.  Genitourinary: Negative for difficulty urinating, frequency and vaginal pain.  Musculoskeletal: Negative for back pain and gait problem.  Skin: Negative for pallor and rash.  Neurological: Negative for dizziness, tremors, weakness, numbness and headaches.  Psychiatric/Behavioral: Positive for confusion and decreased concentration. Negative for sleep disturbance.    Objective:  BP (!) 142/94 (BP Location: Right Arm, Patient Position: Sitting, Cuff Size: Normal)   Pulse (!) 56   Temp 98.4 F (36.9 C) (Oral)   Ht 5\' 7"  (1.702 m)   Wt 180 lb (81.6 kg)   SpO2 99%   BMI 28.19 kg/m    BP Readings from Last 3 Encounters:  09/28/19 (!) 142/94  07/17/19 (!) 160/100  05/26/19 140/86    Wt Readings from Last 3 Encounters:  09/28/19 180 lb (81.6 kg)  05/26/19 171 lb (77.6 kg)  01/21/19 165 lb (74.8 kg)    Physical Exam Constitutional:      General: She is not in acute distress.    Appearance: She is well-developed. She is obese.  HENT:     Head: Normocephalic.     Right Ear: External ear normal.     Left Ear: External ear normal.     Nose: Nose normal.  Eyes:     General:        Right eye: No discharge.        Left eye: No discharge.     Conjunctiva/sclera: Conjunctivae normal.     Pupils: Pupils are equal, round, and reactive to light.  Neck:     Musculoskeletal: Normal range of motion and neck supple.     Thyroid: No thyromegaly.     Vascular: No JVD.     Trachea: No tracheal deviation.  Cardiovascular:     Rate and Rhythm: Normal rate and regular rhythm.     Heart sounds: Normal heart sounds.  Pulmonary:     Effort: No respiratory distress.     Breath sounds: No stridor. No wheezing.  Abdominal:     General: Bowel sounds are normal. There is no distension.     Palpations: Abdomen  is soft. There is no mass.     Tenderness: There is no abdominal tenderness. There is no guarding or rebound.  Musculoskeletal:        General: No tenderness.  Lymphadenopathy:     Cervical: No cervical adenopathy.  Skin:    Findings: No erythema or rash.  Neurological:     Mental Status: She is disoriented.     Cranial Nerves: No cranial nerve deficit.     Motor: No abnormal muscle tone.     Coordination: Coordination normal.     Deep Tendon Reflexes: Reflexes normal.  Psychiatric:        Behavior: Behavior normal.        Thought Content: Thought content normal.        Judgment: Judgment normal.     Lab Results  Component Value Date   WBC 5.8 05/26/2019   HGB 13.5 05/26/2019   HCT 41.2 05/26/2019   PLT 291.0 05/26/2019   GLUCOSE 89 05/26/2019   CHOL 283  (H) 04/01/2018   TRIG 78.0 04/01/2018   HDL 61.60 04/01/2018   LDLCALC 206 (H) 04/01/2018   ALT 6 05/26/2019   AST 11 05/26/2019   NA 142 05/26/2019   K 3.5 05/26/2019   CL 104 05/26/2019   CREATININE 0.95 05/26/2019   BUN 17 05/26/2019   CO2 30 05/26/2019   TSH 5.99 (H) 05/26/2019    Ct Head Wo Contrast  Result Date: 04/09/2018 CLINICAL DATA:  Altered level of consciousness EXAM: CT HEAD WITHOUT CONTRAST TECHNIQUE: Contiguous axial images were obtained from the base of the skull through the vertex without intravenous contrast. COMPARISON:  None. FINDINGS: Brain: No evidence of acute infarction, hemorrhage, extra-axial collection, ventriculomegaly, or mass effect. Generalized cerebral atrophy. Periventricular white matter low attenuation likely secondary to microangiopathy. Vascular: Cerebrovascular atherosclerotic calcifications are noted. Skull: Negative for fracture or focal lesion. Sinuses/Orbits: Visualized portions of the orbits are unremarkable. Visualized portions of the paranasal sinuses and mastoid air cells are unremarkable. Other: None. IMPRESSION: No acute intracranial pathology. Electronically Signed   By: Kathreen Devoid   On: 04/09/2018 14:57    Assessment & Plan:   Diagnoses and all orders for this visit:  Abnormal TSH  Essential hypertension     No orders of the defined types were placed in this encounter.    Follow-up: No follow-ups on file.  Walker Kehr, MD

## 2019-09-28 NOTE — Assessment & Plan Note (Signed)
Labs Cut back on crbs

## 2019-09-28 NOTE — Assessment & Plan Note (Signed)
Taking BP meds Cont to monitor BP

## 2019-10-13 ENCOUNTER — Telehealth: Payer: Self-pay | Admitting: Internal Medicine

## 2019-10-13 NOTE — Telephone Encounter (Signed)
[name] ( sister ) requesting memantine (NAMENDA XR) 21 MG CP24 24 hr capsule and states she would like to pick up the actually medication in office and would like a call when ready for pick up.

## 2019-10-23 NOTE — Telephone Encounter (Signed)
Order number [id number]  They also stated they received re-enrollment paperwork and will need it refill out. Will call when recieved

## 2019-10-23 NOTE — Telephone Encounter (Signed)
LM with sister notifying her we need a notarized POA form since she signed the paperwork

## 2019-10-26 ENCOUNTER — Telehealth: Payer: Self-pay | Admitting: Internal Medicine

## 2019-10-26 MED ORDER — MEMANTINE HCL ER 21 MG PO CP24: 21.0000 mg | ORAL_CAPSULE | Freq: Every day | ORAL | 0 refills | Status: DC

## 2019-10-26 NOTE — Telephone Encounter (Signed)
Copied from [place] [id number]. Topic: General - Other >> Oct 26, 2019  9:31 AM Keene Breath wrote: Reason for CRM: Patient's sister, [name], would like the doctor or nurse to call her regarding patient's medication.  CB# [phone], or Cell# [phone]

## 2019-10-26 NOTE — Telephone Encounter (Signed)
Contacted sister and she is going to bring patient to sign paperwork so we can refax it

## 2019-10-30 ENCOUNTER — Ambulatory Visit: Payer: Medicare Other | Admitting: Podiatry

## 2019-12-11 ENCOUNTER — Other Ambulatory Visit: Payer: Self-pay | Admitting: Internal Medicine

## 2019-12-24 ENCOUNTER — Other Ambulatory Visit: Payer: Self-pay | Admitting: Internal Medicine

## 2019-12-24 NOTE — Telephone Encounter (Signed)
   Sister, [name] requesting refill on memantine (NAMENDA XR) 21 MG CP24 24 hr capsule to pick up from office.  Please call

## 2019-12-25 NOTE — Telephone Encounter (Signed)
Pt get her Namenda from pt assistant. Last done 3/20. Will hold for Inez Catalina to complete whn she return.Marland KitchenJohny Chess

## 2019-12-29 NOTE — Telephone Encounter (Signed)
Sister notified that she will have to bring patient up here to sign forms before patient assistance will send anymore medication

## 2020-01-11 ENCOUNTER — Telehealth: Payer: Self-pay | Admitting: Internal Medicine

## 2020-01-11 NOTE — Telephone Encounter (Signed)
LM notifying to patient assistance was approved and it is being shipped from [place] which has caused delays due to the weather but it should be here this week.

## 2020-01-11 NOTE — Telephone Encounter (Signed)
Patient's sister is calling to follow up on some patient assistance forms that was completed. She is requesting a call back to discuss.

## 2020-01-15 ENCOUNTER — Encounter: Payer: Self-pay | Admitting: Podiatry

## 2020-01-15 ENCOUNTER — Ambulatory Visit (INDEPENDENT_AMBULATORY_CARE_PROVIDER_SITE_OTHER): Payer: Medicare HMO | Admitting: Podiatry

## 2020-01-15 ENCOUNTER — Other Ambulatory Visit: Payer: Self-pay

## 2020-01-15 VITALS — Temp 97.8°F

## 2020-01-15 DIAGNOSIS — B351 Tinea unguium: Secondary | ICD-10-CM | POA: Diagnosis not present

## 2020-01-15 DIAGNOSIS — M79674 Pain in right toe(s): Secondary | ICD-10-CM

## 2020-01-15 DIAGNOSIS — M79675 Pain in left toe(s): Secondary | ICD-10-CM

## 2020-01-15 NOTE — Patient Instructions (Signed)

## 2020-01-16 NOTE — Progress Notes (Signed)
Subjective: Deborah Myers presents today for follow up of painful mycotic nails b/l that are difficult to trim. Pain interferes with ambulation. Aggravating factors include wearing enclosed shoe gear. Pain is relieved with periodic professional debridement.   Allergies  Allergen Reactions  . Tiazac [Diltiazem]     edema     Objective: Vitals:   01/15/20 1332  Temp: 97.8 F (36.6 C)    Vascular Examination:  Capillary refill time to digits immediate b/l, DP pulse palpable right LE and decreased left LE, faintly palpable PT pulses b/l, pedal hair absent b/l and skin temperature gradient within normal limits b/l  Dermatological Examination: Pedal skin with normal turgor, texture and tone bilaterally, no open wounds bilaterally, no interdigital macerations bilaterally and toenails 1-5 b/l elongated, dystrophic, thickened, crumbly with subungual debris  Musculoskeletal: Normal muscle strength 5/5 to all lower extremity muscle groups bilaterally, no pain crepitus or joint limitation noted with ROM b/l, bunion deformity noted b/l and hammertoes noted to the  L 2nd toe and R 2nd toe  Neurological: Protective sensation intact 5/5 intact bilaterally with 10g monofilament b/l and unable to comply with commands of objective examination, but she does respond to external noxious stimuli  Assessment: 1. Pain due to onychomycosis of toenails of both feet    Plan: -Toenails 1-5 b/l were debrided in length and girth with sterile nail nippers and dremel without iatrogenic bleeding. -Patient to continue soft, supportive shoe gear daily. -Patient to report any pedal injuries to medical professional immediately. -Patient/POA to call should there be question/concern in the interim.  Return in about 3 months (around 04/13/2020) for nail trim.

## 2020-01-19 ENCOUNTER — Telehealth: Payer: Self-pay

## 2020-01-19 NOTE — Telephone Encounter (Signed)
Patient's sister, [name], calling to check and see if the office has received the memantine (NAMENDA XR) 21 MG CP24 24 hr capsule  that comes from patient assistance? Please advise.  CB#: [phone]

## 2020-01-21 NOTE — Telephone Encounter (Signed)
Picked up medication yesterday

## 2020-03-18 ENCOUNTER — Telehealth: Payer: Self-pay | Admitting: Internal Medicine

## 2020-03-18 MED ORDER — DONEPEZIL HCL 10 MG PO TABS: 10.0000 mg | ORAL_TABLET | Freq: Every day | ORAL | 0 refills | Status: DC

## 2020-03-18 NOTE — Telephone Encounter (Signed)
    1.Medication Requested: donepezil (ARICEPT) 10 MG tablet  2. Pharmacy (Name, Street, [place]): [facility]  3. On Med List: yes   4. Last Visit with PCP:   5. Next visit date with PCP: 03/31/20   Agent: Please be advised that RX refills may take up to 3 business days. We ask that you follow-up with your pharmacy.

## 2020-03-18 NOTE — Telephone Encounter (Signed)
Pt is due in July for annual appt sent 90 day script until appt.Marland KitchenJohny Myers

## 2020-03-31 ENCOUNTER — Ambulatory Visit (INDEPENDENT_AMBULATORY_CARE_PROVIDER_SITE_OTHER): Payer: Medicare HMO | Admitting: Internal Medicine

## 2020-03-31 ENCOUNTER — Encounter: Payer: Self-pay | Admitting: Internal Medicine

## 2020-03-31 ENCOUNTER — Other Ambulatory Visit: Payer: Self-pay

## 2020-03-31 DIAGNOSIS — F0280 Dementia in other diseases classified elsewhere without behavioral disturbance: Secondary | ICD-10-CM | POA: Diagnosis not present

## 2020-03-31 DIAGNOSIS — I10 Essential (primary) hypertension: Secondary | ICD-10-CM | POA: Diagnosis not present

## 2020-03-31 DIAGNOSIS — G301 Alzheimer's disease with late onset: Secondary | ICD-10-CM | POA: Diagnosis not present

## 2020-03-31 DIAGNOSIS — E876 Hypokalemia: Secondary | ICD-10-CM | POA: Diagnosis not present

## 2020-03-31 DIAGNOSIS — R7989 Other specified abnormal findings of blood chemistry: Secondary | ICD-10-CM

## 2020-03-31 DIAGNOSIS — E785 Hyperlipidemia, unspecified: Secondary | ICD-10-CM

## 2020-03-31 LAB — CBC WITH DIFFERENTIAL/PLATELET
Basophils Absolute: 0 10*3/uL (ref 0.0–0.1)
Basophils Relative: 0.6 % (ref 0.0–3.0)
Eosinophils Absolute: 0.6 10*3/uL (ref 0.0–0.7)
Eosinophils Relative: 8 % — ABNORMAL HIGH (ref 0.0–5.0)
HCT: 43.4 % (ref 36.0–46.0)
Hemoglobin: 14 g/dL (ref 12.0–15.0)
Lymphocytes Relative: 20.7 % (ref 12.0–46.0)
Lymphs Abs: 1.6 10*3/uL (ref 0.7–4.0)
MCHC: 32.3 g/dL (ref 30.0–36.0)
MCV: 86.1 fl (ref 78.0–100.0)
Monocytes Absolute: 0.9 10*3/uL (ref 0.1–1.0)
Monocytes Relative: 11.8 % (ref 3.0–12.0)
Neutro Abs: 4.4 10*3/uL (ref 1.4–7.7)
Neutrophils Relative %: 58.9 % (ref 43.0–77.0)
Platelets: 292 10*3/uL (ref 150.0–400.0)
RBC: 5.04 Mil/uL (ref 3.87–5.11)
RDW: 13.6 % (ref 11.5–15.5)
WBC: 7.5 10*3/uL (ref 4.0–10.5)

## 2020-03-31 LAB — BASIC METABOLIC PANEL
BUN: 17 mg/dL (ref 6–23)
CO2: 32 mEq/L (ref 19–32)
Calcium: 9.6 mg/dL (ref 8.4–10.5)
Chloride: 102 mEq/L (ref 96–112)
Creatinine, Ser: 0.89 mg/dL (ref 0.40–1.20)
GFR: 73.45 mL/min (ref >60.00)
Glucose, Bld: 85 mg/dL (ref 70–99)
Potassium: 4 mEq/L (ref 3.5–5.1)
Sodium: 138 mEq/L (ref 135–145)

## 2020-03-31 LAB — HEPATIC FUNCTION PANEL
ALT: 8 U/L (ref 0–35)
AST: 14 U/L (ref 0–37)
Albumin: 3.8 g/dL (ref 3.5–5.2)
Alkaline Phosphatase: 74 U/L (ref 39–117)
Bilirubin, Direct: 0.2 mg/dL (ref 0.0–0.3)
Total Bilirubin: 0.6 mg/dL (ref 0.2–1.2)
Total Protein: 7.8 g/dL (ref 6.0–8.3)

## 2020-03-31 LAB — T4, FREE: Free T4: 0.78 ng/dL (ref 0.60–1.60)

## 2020-03-31 LAB — TSH: TSH: 6.26 u[IU]/mL — ABNORMAL HIGH (ref 0.35–4.50)

## 2020-03-31 MED ORDER — DONEPEZIL HCL 10 MG PO TABS: 10.0000 mg | ORAL_TABLET | Freq: Every day | ORAL | 3 refills | Status: DC

## 2020-03-31 NOTE — Assessment & Plan Note (Signed)
Aricept, Namenda Isolation is affecting the pt negatively

## 2020-03-31 NOTE — Progress Notes (Signed)
Subjective:  Patient ID: Deborah Myers, female    DOB: 1936-10-22  Age: 84 y.o. MRN: [id number]  CC: No chief complaint on file.   HPI Deborah Myers presents for dementia, HTN C/o abd discomfort this am, no n/v/d.  Outpatient Medications Prior to Visit  Medication Sig Dispense Refill  . b complex vitamins tablet Take 1 tablet by mouth daily. 100 tablet 3  . carvedilol (COREG CR) 20 MG 24 hr capsule Take 1 capsule (20 mg total) by mouth daily. 90 capsule 3  . Cholecalciferol (VITAMIN D3) 50 MCG (2000 UT) capsule Take 1 capsule (2,000 Units total) by mouth daily. 100 capsule 3  . donepezil (ARICEPT) 10 MG tablet Take 1 tablet (10 mg total) by mouth at bedtime. Annual appt due in July must see provider for future refills 90 tablet 0  . memantine (NAMENDA XR) 21 MG CP24 24 hr capsule Take 1 capsule (21 mg total) by mouth daily. 30 capsule 0   No facility-administered medications prior to visit.    ROS: Review of Systems  Constitutional: Negative for activity change, appetite change, chills, fatigue and unexpected weight change.  HENT: Negative for congestion, mouth sores and sinus pressure.   Eyes: Negative for visual disturbance.  Respiratory: Negative for cough and chest tightness.   Gastrointestinal: Negative for abdominal pain and nausea.  Genitourinary: Negative for difficulty urinating, frequency and vaginal pain.  Musculoskeletal: Negative for back pain and gait problem.  Skin: Negative for pallor and rash.  Neurological: Negative for dizziness, tremors, weakness, numbness and headaches.  Psychiatric/Behavioral: Positive for confusion and decreased concentration. Negative for sleep disturbance and suicidal ideas.    Objective:  BP 140/90 (BP Location: Left Arm, Patient Position: Sitting, Cuff Size: Normal)   Pulse 61   Temp 98.2 F (36.8 C) (Oral)   Ht 5\' 7"  (1.702 m)   Wt 173 lb (78.5 kg)   SpO2 98%   BMI 27.10 kg/m   BP Readings from Last 3 Encounters:   03/31/20 140/90  09/28/19 (!) 142/94  07/17/19 (!) 160/100    Wt Readings from Last 3 Encounters:  03/31/20 173 lb (78.5 kg)  09/28/19 180 lb (81.6 kg)  05/26/19 171 lb (77.6 kg)    Physical Exam Constitutional:      General: She is not in acute distress.    Appearance: She is well-developed.  HENT:     Head: Normocephalic.     Right Ear: External ear normal.     Left Ear: External ear normal.     Nose: Nose normal.  Eyes:     General:        Right eye: No discharge.        Left eye: No discharge.     Conjunctiva/sclera: Conjunctivae normal.     Pupils: Pupils are equal, round, and reactive to light.  Neck:     Thyroid: No thyromegaly.     Vascular: No JVD.     Trachea: No tracheal deviation.  Cardiovascular:     Rate and Rhythm: Normal rate and regular rhythm.     Heart sounds: Normal heart sounds.  Pulmonary:     Effort: No respiratory distress.     Breath sounds: No stridor. No wheezing.  Abdominal:     General: Bowel sounds are normal. There is no distension.     Palpations: Abdomen is soft. There is no mass.     Tenderness: There is no abdominal tenderness. There is no guarding or rebound.  Musculoskeletal:  General: No tenderness.     Cervical back: Normal range of motion and neck supple.  Lymphadenopathy:     Cervical: No cervical adenopathy.  Skin:    Findings: No erythema or rash.  Neurological:     Mental Status: She is disoriented.     Cranial Nerves: No cranial nerve deficit.     Motor: No abnormal muscle tone.     Coordination: Coordination normal.     Deep Tendon Reflexes: Reflexes normal.  Psychiatric:        Behavior: Behavior normal.     Lab Results  Component Value Date   WBC 5.8 05/26/2019   HGB 13.5 05/26/2019   HCT 41.2 05/26/2019   PLT 291.0 05/26/2019   GLUCOSE 83 09/28/2019   CHOL 283 (H) 04/01/2018   TRIG 78.0 04/01/2018   HDL 61.60 04/01/2018   LDLCALC 206 (H) 04/01/2018   ALT 6 05/26/2019   AST 11 05/26/2019    NA 139 09/28/2019   K 3.6 09/28/2019   CL 102 09/28/2019   CREATININE 0.96 09/28/2019   BUN 14 09/28/2019   CO2 30 09/28/2019   TSH 5.62 (H) 09/28/2019    CT Head Wo Contrast  Result Date: 04/09/2018 CLINICAL DATA:  Altered level of consciousness EXAM: CT HEAD WITHOUT CONTRAST TECHNIQUE: Contiguous axial images were obtained from the base of the skull through the vertex without intravenous contrast. COMPARISON:  None. FINDINGS: Brain: No evidence of acute infarction, hemorrhage, extra-axial collection, ventriculomegaly, or mass effect. Generalized cerebral atrophy. Periventricular white matter low attenuation likely secondary to microangiopathy. Vascular: Cerebrovascular atherosclerotic calcifications are noted. Skull: Negative for fracture or focal lesion. Sinuses/Orbits: Visualized portions of the orbits are unremarkable. Visualized portions of the paranasal sinuses and mastoid air cells are unremarkable. Other: None. IMPRESSION: No acute intracranial pathology. Electronically Signed   By: Kathreen Devoid   On: 04/09/2018 14:57    Assessment & Plan:    Deborah Kehr, MD

## 2020-03-31 NOTE — Assessment & Plan Note (Signed)
FT4 

## 2020-03-31 NOTE — Addendum Note (Signed)
Addended by: Cresenciano Lick on: 03/31/2020 02:45 PM   Modules accepted: Orders

## 2020-03-31 NOTE — Assessment & Plan Note (Signed)
BMET 

## 2020-03-31 NOTE — Assessment & Plan Note (Signed)
BP ok at home Coreg CR Risks associated with treatment noncompliance were discussed. Compliance was encouraged 

## 2020-03-31 NOTE — Assessment & Plan Note (Signed)
ft4

## 2020-04-05 ENCOUNTER — Telehealth: Payer: Self-pay

## 2020-04-05 MED ORDER — MEMANTINE HCL ER 21 MG PO CP24: 21.0000 mg | ORAL_CAPSULE | Freq: Every day | ORAL | 3 refills | Status: DC

## 2020-04-05 NOTE — Telephone Encounter (Signed)
Spoke with patient assistance and they stated they need a new RX that states "ship to patient home"  RX faxed

## 2020-04-05 NOTE — Telephone Encounter (Signed)
New message   The patient is asking the CMA to call [facility] [phone] to have medication come to the home was told the MD has to approve this.   memantine (NAMENDA XR) 21 MG CP24 24 hr capsule

## 2020-04-13 ENCOUNTER — Telehealth: Payer: Self-pay | Admitting: Internal Medicine

## 2020-04-13 MED ORDER — CARVEDILOL PHOSPHATE ER 20 MG PO CP24: 20.0000 mg | ORAL_CAPSULE | Freq: Every day | ORAL | 3 refills | Status: DC

## 2020-04-13 NOTE — Telephone Encounter (Signed)
Refill sent. See meds.  

## 2020-04-13 NOTE — Telephone Encounter (Signed)
     1.Medication Requested: carvedilol (COREG CR) 20 MG 24 hr capsule  2. Pharmacy (Name, Street, [place]):[facility] ([facility] [place] - [address]  3. On Med List: yes  4. Last Visit with PCP: 03/31/2020  5. Next visit date with PCP:   Agent: Please be advised that RX refills may take up to 3 business days. We ask that you follow-up with your pharmacy.

## 2020-04-15 ENCOUNTER — Other Ambulatory Visit: Payer: Self-pay

## 2020-04-15 ENCOUNTER — Ambulatory Visit: Payer: Medicare HMO | Admitting: Podiatry

## 2020-04-15 ENCOUNTER — Encounter: Payer: Self-pay | Admitting: Podiatry

## 2020-04-15 DIAGNOSIS — B351 Tinea unguium: Secondary | ICD-10-CM | POA: Diagnosis not present

## 2020-04-15 DIAGNOSIS — M79675 Pain in left toe(s): Secondary | ICD-10-CM

## 2020-04-15 DIAGNOSIS — M79674 Pain in right toe(s): Secondary | ICD-10-CM

## 2020-04-15 NOTE — Progress Notes (Signed)
Subjective: Deborah Myers is a 84 y.o. female patient seen today painful mycotic nails b/l that are difficult to trim. Pain interferes with ambulation. Aggravating factors include wearing enclosed shoe gear. Pain is relieved with periodic professional debridement  Patient Active Problem List   Diagnosis Date Noted  . Weight gain 09/28/2019  . Abnormal TSH 05/26/2019  . Urinary tract infection 05/09/2018  . Alzheimer disease ([place]) 04/01/2018  . Insomnia 09/11/2016  . Arthritis of right lower extremity 03/10/2015  . Edema 12/08/2014  . Pain in joint, lower leg 01/04/2014  . 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 Intermediate uveitis 05/10/2013  . Hypokalemia 03/11/2013  . PHN (postherpetic neuralgia) 10/21/2012  . Well adult exam 03/19/2012  . Hx of adenomatous colonic polyps 04/17/2011  . Thyrotoxicosis 03/24/2009  . Dyslipidemia 07/24/2007  . Essential hypertension 07/24/2007  . Osteoporosis 07/24/2007    Current Outpatient Medications on File Prior to Visit  Medication Sig Dispense Refill  . b complex vitamins tablet Take 1 tablet by mouth daily. 100 tablet 3  . carvedilol (COREG CR) 20 MG 24 hr capsule Take 1 capsule (20 mg total) by mouth daily. 90 capsule 3  . Cholecalciferol (VITAMIN D3) 50 MCG (2000 UT) capsule Take 1 capsule (2,000 Units total) by mouth daily. 100 capsule 3  . donepezil (ARICEPT) 10 MG tablet Take 1 tablet (10 mg total) by mouth at bedtime. Annual appt due in July must see provider for future refills 90 tablet 3  . memantine (NAMENDA XR) 21 MG CP24 24 hr capsule Take 1 capsule (21 mg total) by mouth daily. 90 capsule 3   No current facility-administered medications on file prior to visit.    Allergies  Allergen Reactions  . Tiazac [Diltiazem]     edema    Objective: Physical Exam  General: Devanshi Cruse is a pleasant 84 y.o. African American female, in NAD. AAO x 3.   Vascular:  Neurovascular status unchanged b/l. Capillary refill time to digits  immediate b/l. Faintly palpable PT pulses b/l. DP pulse decreased left foot. DP pulse palpable right foot. Pedal hair absent b/l Skin temperature gradient within normal limits b/l. Trace edema noted b/l feet.  Dermatological:  Pedal skin with normal turgor, texture and tone bilaterally. No open wounds bilaterally. No interdigital macerations bilaterally. Toenails 1-5 b/l elongated, dystrophic, thickened, crumbly with subungual debris and tenderness to dorsal palpation.  Musculoskeletal:  Normal muscle strength 5/5 to all lower extremity muscle groups bilaterally. No pain crepitus or joint limitation noted with ROM b/l. Hallux valgus with bunion deformity noted b/l. Hammertoes noted to the L 2nd toe and R 2nd toe.  Neurological:  Pt unable to comply with commands of objective examination, but she does respond to external noxious stimuli.  Assessment and Plan:  1. Pain due to onychomycosis of toenails of both feet    -Examined patient. -No new findings. No new orders. -Toenails 1-5 b/l were debrided in length and girth with sterile nail nippers and dremel without iatrogenic bleeding.  -Patient to continue soft, supportive shoe gear daily. -Patient to report any pedal injuries to medical professional immediately. -Patient/POA to call should there be question/concern in the interim.  Return in about 10 weeks (around 06/24/2020) for nail trim.  Marzetta Board, DPM

## 2020-05-18 ENCOUNTER — Telehealth: Payer: Self-pay | Admitting: Internal Medicine

## 2020-05-18 MED ORDER — CARVEDILOL PHOSPHATE ER 20 MG PO CP24: 20.0000 mg | ORAL_CAPSULE | Freq: Every day | ORAL | 3 refills | Status: DC

## 2020-05-18 NOTE — Telephone Encounter (Signed)
New message:  1.Medication Requested: carvedilol (COREG CR) 20 MG 24 hr capsule  2. Pharmacy (Name, Street, [place]): [id number] [place], [place] 3. On Med List: Yes  4. Last Visit with PCP: 03/31/20  5. Next visit date with PCP: none   Agent: Please be advised that RX refills may take up to 3 business days. We ask that you follow-up with your pharmacy.   Pt states this prescription carvedilol (COREG CR) 20 MG 24 hr capsule needs to be transferred [facility] [address], [place]

## 2020-05-18 NOTE — Telephone Encounter (Signed)
Rx sent. See meds.  

## 2020-06-14 ENCOUNTER — Other Ambulatory Visit: Payer: Self-pay | Admitting: Internal Medicine

## 2020-06-23 ENCOUNTER — Telehealth: Payer: Self-pay | Admitting: Internal Medicine

## 2020-06-23 NOTE — Telephone Encounter (Signed)
° ° °  Please send refill request for memantine (NAMENDA XR) 21 MG CP24 24 hr capsule to assistance program Phone [phone] Fax [phone] Requesting medication be mailed to home address

## 2020-06-24 NOTE — Telephone Encounter (Signed)
Spoke with someone from [place] assist that states pt's enrollment is still active and she has 2 refills remaining but I have to send a new form (she faxed form to me) with office change of address. I have filled out form and have to wait MD to sign, will fax back next week.

## 2020-06-24 NOTE — Telephone Encounter (Signed)
Called to order Refill for Memantine (Namenda), pt's enrollment has expired will start a new application

## 2020-06-27 ENCOUNTER — Encounter: Payer: Self-pay | Admitting: Podiatry

## 2020-06-27 ENCOUNTER — Other Ambulatory Visit: Payer: Self-pay

## 2020-06-27 ENCOUNTER — Ambulatory Visit (INDEPENDENT_AMBULATORY_CARE_PROVIDER_SITE_OTHER): Payer: PRIVATE HEALTH INSURANCE | Admitting: Podiatry

## 2020-06-27 DIAGNOSIS — B351 Tinea unguium: Secondary | ICD-10-CM

## 2020-06-27 DIAGNOSIS — M2011 Hallux valgus (acquired), right foot: Secondary | ICD-10-CM

## 2020-06-27 DIAGNOSIS — M79674 Pain in right toe(s): Secondary | ICD-10-CM

## 2020-06-27 DIAGNOSIS — M2042 Other hammer toe(s) (acquired), left foot: Secondary | ICD-10-CM

## 2020-06-27 DIAGNOSIS — M79675 Pain in left toe(s): Secondary | ICD-10-CM

## 2020-06-27 DIAGNOSIS — M2012 Hallux valgus (acquired), left foot: Secondary | ICD-10-CM

## 2020-06-27 DIAGNOSIS — M2041 Other hammer toe(s) (acquired), right foot: Secondary | ICD-10-CM

## 2020-06-28 NOTE — Telephone Encounter (Signed)
F/u   Sister [name] calling checking on the status of patient assistance.

## 2020-06-28 NOTE — Progress Notes (Signed)
Subjective: Deborah Myers is a 84 y.o. female patient seen today painful mycotic nails b/l that are difficult to trim. Pain interferes with ambulation. Aggravating factors include wearing enclosed shoe gear. Pain is relieved with periodic professional debridement.  She voices no new pedal problems on today's visit. She is accompanied by her caregiver.  Patient Active Problem List   Diagnosis Date Noted  . Weight gain 09/28/2019  . Abnormal TSH 05/26/2019  . Urinary tract infection 05/09/2018  . Alzheimer disease ([place]) 04/01/2018  . Insomnia 09/11/2016  . Arthritis of right lower extremity 03/10/2015  . Edema 12/08/2014  . Pain in joint, lower leg 01/04/2014  . 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 Intermediate uveitis 05/10/2013  . Hypokalemia 03/11/2013  . PHN (postherpetic neuralgia) 10/21/2012  . Well adult exam 03/19/2012  . Hx of adenomatous colonic polyps 04/17/2011  . Thyrotoxicosis 03/24/2009  . Dyslipidemia 07/24/2007  . Essential hypertension 07/24/2007  . Osteoporosis 07/24/2007    Current Outpatient Medications on File Prior to Visit  Medication Sig Dispense Refill  . b complex vitamins tablet Take 1 tablet by mouth daily. 100 tablet 3  . carvedilol (COREG CR) 20 MG 24 hr capsule Take 1 capsule (20 mg total) by mouth daily. 90 capsule 3  . Cholecalciferol (VITAMIN D3) 50 MCG (2000 UT) capsule Take 1 capsule (2,000 Units total) by mouth daily. 100 capsule 3  . donepezil (ARICEPT) 10 MG tablet TAKE 1 TABLET BY MOUTH AT BEDTIME 90 tablet 3  . memantine (NAMENDA XR) 21 MG CP24 24 hr capsule Take 1 capsule (21 mg total) by mouth daily. 90 capsule 3   No current facility-administered medications on file prior to visit.    Allergies  Allergen Reactions  . Tiazac [Diltiazem]     edema    Objective: Physical Exam  General: Deborah Myers is a pleasant 84 y.o. African American female, in NAD. AAO x 3.   Vascular:  Neurovascular status unchanged b/l. Capillary refill  time to digits immediate b/l. Faintly palpable PT pulses b/l. DP pulse decreased left foot. DP pulse palpable right foot. Pedal hair absent b/l Skin temperature gradient within normal limits b/l. Trace edema noted b/l feet.  Dermatological:  Pedal skin with normal turgor, texture and tone bilaterally. No open wounds bilaterally. No interdigital macerations bilaterally. Toenails 1-5 b/l elongated, dystrophic, thickened, crumbly with subungual debris and tenderness to dorsal palpation.  Musculoskeletal:  Normal muscle strength 5/5 to all lower extremity muscle groups bilaterally. No pain crepitus or joint limitation noted with ROM b/l. Hallux valgus with bunion deformity noted b/l. Hammertoes noted to the L 2nd toe and R 2nd toe.  Neurological:  Pt unable to comply with commands of objective examination, but she does respond to external noxious stimuli.  Assessment and Plan:  1. Pain due to onychomycosis of toenails of both feet   2. Hallux valgus, acquired, bilateral   3. Acquired hammertoes of both feet    -Examined patient. -No new findings. No new orders. -Toenails 1-5 b/l were debrided in length and girth with sterile nail nippers and dremel without iatrogenic bleeding.  -Patient to continue soft, supportive shoe gear daily. -Patient to report any pedal injuries to medical professional immediately. -Patient/POA to call should there be question/concern in the interim.  Return in about 9 weeks (around 08/29/2020) for nail trim.  Marzetta Board, DPM

## 2020-07-05 NOTE — Telephone Encounter (Signed)
Spoke with pt's sister and informed her that I have tried to fax paperwork and have not yet been successful and will contact abbvie to get another fax number

## 2020-07-06 ENCOUNTER — Telehealth: Payer: Self-pay

## 2020-07-06 NOTE — Telephone Encounter (Signed)
New message    [name] calling back regarding  memantine (NAMENDA XR) 21 MG CP24 24 hr capsule asking for a call back from the [place].

## 2020-07-08 NOTE — Telephone Encounter (Signed)
F/u    The patient sister [name] is calling back on yesterday message aware the MD/ CMA are not in the office today

## 2020-07-12 NOTE — Telephone Encounter (Signed)
  Follow up message  Patients sister [name] requesting call to discuss memantine (NAMENDA XR) 21 MG CP24 24 hr capsule

## 2020-07-20 NOTE — Telephone Encounter (Signed)
Left Pt's sister a vm informing her that pt's Namenda should be arriving within 5-7 business days

## 2020-09-27 ENCOUNTER — Ambulatory Visit: Payer: Medicare Other | Admitting: Podiatry

## 2020-10-11 ENCOUNTER — Telehealth: Payer: Self-pay | Admitting: Internal Medicine

## 2020-10-11 NOTE — Telephone Encounter (Signed)
Tried calling pt to inform her we received her Pt assistance for her Namenda XR. There was no answer had to leave msg on vm.../lmb  Namenda XR, [address]th[facility] [address], Product # [id number], Delivery # [id number]

## 2020-10-11 NOTE — Telephone Encounter (Signed)
Abbvie calling to confirm that patient's Namenda XR had arrived through patient assistance.  Confirmed arrival and patient will be contacted.

## 2021-01-02 ENCOUNTER — Other Ambulatory Visit: Payer: Self-pay

## 2021-01-02 ENCOUNTER — Encounter: Payer: Self-pay | Admitting: Podiatry

## 2021-01-02 ENCOUNTER — Ambulatory Visit: Payer: Medicare HMO | Admitting: Podiatry

## 2021-01-02 DIAGNOSIS — M2041 Other hammer toe(s) (acquired), right foot: Secondary | ICD-10-CM

## 2021-01-02 DIAGNOSIS — M2012 Hallux valgus (acquired), left foot: Secondary | ICD-10-CM

## 2021-01-02 DIAGNOSIS — B351 Tinea unguium: Secondary | ICD-10-CM | POA: Diagnosis not present

## 2021-01-02 DIAGNOSIS — M79675 Pain in left toe(s): Secondary | ICD-10-CM | POA: Diagnosis not present

## 2021-01-02 DIAGNOSIS — M2011 Hallux valgus (acquired), right foot: Secondary | ICD-10-CM

## 2021-01-02 DIAGNOSIS — M79674 Pain in right toe(s): Secondary | ICD-10-CM

## 2021-01-02 DIAGNOSIS — M2042 Other hammer toe(s) (acquired), left foot: Secondary | ICD-10-CM

## 2021-01-03 ENCOUNTER — Other Ambulatory Visit: Payer: Self-pay

## 2021-01-04 ENCOUNTER — Other Ambulatory Visit: Payer: Self-pay

## 2021-01-04 ENCOUNTER — Encounter: Payer: Self-pay | Admitting: Internal Medicine

## 2021-01-04 ENCOUNTER — Ambulatory Visit (INDEPENDENT_AMBULATORY_CARE_PROVIDER_SITE_OTHER): Payer: Medicare HMO | Admitting: Internal Medicine

## 2021-01-04 VITALS — BP 138/84 | HR 54 | Temp 98.0°F | Ht 67.0 in | Wt 184.6 lb

## 2021-01-04 DIAGNOSIS — R739 Hyperglycemia, unspecified: Secondary | ICD-10-CM

## 2021-01-04 DIAGNOSIS — I10 Essential (primary) hypertension: Secondary | ICD-10-CM | POA: Diagnosis not present

## 2021-01-04 DIAGNOSIS — F0280 Dementia in other diseases classified elsewhere without behavioral disturbance: Secondary | ICD-10-CM | POA: Diagnosis not present

## 2021-01-04 DIAGNOSIS — R7989 Other specified abnormal findings of blood chemistry: Secondary | ICD-10-CM | POA: Diagnosis not present

## 2021-01-04 DIAGNOSIS — R635 Abnormal weight gain: Secondary | ICD-10-CM

## 2021-01-04 DIAGNOSIS — G309 Alzheimer's disease, unspecified: Secondary | ICD-10-CM | POA: Diagnosis not present

## 2021-01-04 LAB — COMPREHENSIVE METABOLIC PANEL
ALT: 8 U/L (ref 0–35)
AST: 12 U/L (ref 0–37)
Albumin: 3.7 g/dL (ref 3.5–5.2)
Alkaline Phosphatase: 70 U/L (ref 39–117)
BUN: 15 mg/dL (ref 6–23)
CO2: 32 mEq/L (ref 19–32)
Calcium: 9.8 mg/dL (ref 8.4–10.5)
Chloride: 104 mEq/L (ref 96–112)
Creatinine, Ser: 1.01 mg/dL (ref 0.40–1.20)
GFR: 51.7 mL/min — ABNORMAL LOW (ref >60.00)
Glucose, Bld: 76 mg/dL (ref 70–99)
Potassium: 4 mEq/L (ref 3.5–5.1)
Sodium: 143 mEq/L (ref 135–145)
Total Bilirubin: 0.5 mg/dL (ref 0.2–1.2)
Total Protein: 7.2 g/dL (ref 6.0–8.3)

## 2021-01-04 LAB — T4, FREE: Free T4: 0.78 ng/dL (ref 0.60–1.60)

## 2021-01-04 LAB — TSH: TSH: 3.7 u[IU]/mL (ref 0.35–4.50)

## 2021-01-04 LAB — HEMOGLOBIN A1C: Hgb A1c MFr Bld: 5.5 % (ref 4.6–6.5)

## 2021-01-04 MED ORDER — DONEPEZIL HCL 10 MG PO TABS: 10.0000 mg | ORAL_TABLET | Freq: Every day | ORAL | 3 refills | Status: DC

## 2021-01-04 MED ORDER — MEMANTINE HCL ER 21 MG PO CP24: 21.0000 mg | ORAL_CAPSULE | Freq: Every day | ORAL | 3 refills | Status: DC

## 2021-01-04 MED ORDER — CARVEDILOL PHOSPHATE ER 20 MG PO CP24: 20.0000 mg | ORAL_CAPSULE | Freq: Every day | ORAL | 3 refills | Status: DC

## 2021-01-04 NOTE — Assessment & Plan Note (Signed)
BP ok at home Coreg CR Risks associated with treatment noncompliance were discussed. Compliance was encouraged

## 2021-01-04 NOTE — Assessment & Plan Note (Signed)
Check TSH 

## 2021-01-04 NOTE — Progress Notes (Signed)
Subjective:  Patient ID: Deborah Myers, female    DOB: January 06, 1936  Age: 85 y.o. MRN: [id number]  CC: Annual Exam   HPI Deborah Myers presents for HTN, dementia f/u   Outpatient Medications Prior to Visit  Medication Sig Dispense Refill  . b complex vitamins tablet Take 1 tablet by mouth daily. 100 tablet 3  . carvedilol (COREG CR) 20 MG 24 hr capsule Take 1 capsule (20 mg total) by mouth daily. 90 capsule 3  . Cholecalciferol (VITAMIN D3) 50 MCG (2000 UT) capsule Take 1 capsule (2,000 Units total) by mouth daily. 100 capsule 3  . donepezil (ARICEPT) 10 MG tablet TAKE 1 TABLET BY MOUTH AT BEDTIME 90 tablet 3  . memantine (NAMENDA XR) 21 MG CP24 24 hr capsule Take 1 capsule (21 mg total) by mouth daily. (Patient taking differently: Take 21 mg by mouth daily. GOES TO [facility]) 90 capsule 3   No facility-administered medications prior to visit.    ROS: Review of Systems  Constitutional: Positive for unexpected weight change. Negative for activity change, appetite change, chills and fatigue.  HENT: Negative for congestion, mouth sores and sinus pressure.   Eyes: Negative for visual disturbance.  Respiratory: Negative for cough and chest tightness.   Gastrointestinal: Negative for abdominal pain and nausea.  Genitourinary: Negative for difficulty urinating, frequency and vaginal pain.  Musculoskeletal: Negative for back pain and gait problem.  Skin: Negative for pallor and rash.  Neurological: Negative for dizziness, tremors, weakness, numbness and headaches.  Psychiatric/Behavioral: Positive for behavioral problems, confusion and decreased concentration. Negative for agitation, dysphoric mood, sleep disturbance and suicidal ideas. The patient is not nervous/anxious.     Objective:  BP 138/84 (BP Location: Left Arm)   Pulse (!) 54   Temp 98 F (36.7 C) (Oral)   Ht 5\' 7"  (1.702 m)   Wt 184 lb 9.6 oz (83.7 kg)   SpO2 98%   BMI 28.91 kg/m   BP Readings from Last  3 Encounters:  01/04/21 138/84  03/31/20 140/90  09/28/19 (!) 142/94    Wt Readings from Last 3 Encounters:  01/04/21 184 lb 9.6 oz (83.7 kg)  03/31/20 173 lb (78.5 kg)  09/28/19 180 lb (81.6 kg)    Physical Exam Constitutional:      General: She is not in acute distress.    Appearance: She is well-developed. She is obese.  HENT:     Head: Normocephalic.     Right Ear: External ear normal.     Left Ear: External ear normal.     Nose: Nose normal.     Mouth/Throat:     Mouth: Oropharynx is clear and moist.  Eyes:     General:        Right eye: No discharge.        Left eye: No discharge.     Conjunctiva/sclera: Conjunctivae normal.     Pupils: Pupils are equal, round, and reactive to light.  Neck:     Thyroid: No thyromegaly.     Vascular: No JVD.     Trachea: No tracheal deviation.  Cardiovascular:     Rate and Rhythm: Normal rate and regular rhythm.     Heart sounds: Normal heart sounds.  Pulmonary:     Effort: No respiratory distress.     Breath sounds: No stridor. No wheezing.  Abdominal:     General: Bowel sounds are normal. There is no distension.     Palpations: Abdomen is soft. There is  no mass.     Tenderness: There is no abdominal tenderness. There is no guarding or rebound.  Musculoskeletal:        General: No tenderness or edema.     Cervical back: Normal range of motion and neck supple.  Lymphadenopathy:     Cervical: No cervical adenopathy.  Skin:    Findings: No erythema or rash.  Neurological:     Mental Status: Mental status is at baseline.     Cranial Nerves: No cranial nerve deficit.     Motor: No abnormal muscle tone.     Coordination: Coordination normal.     Deep Tendon Reflexes: Reflexes normal.  Psychiatric:        Mood and Affect: Mood and affect normal.        Behavior: Behavior normal.        Thought Content: Thought content normal.        Judgment: Judgment normal.     Lab Results  Component Value Date   WBC 7.5 03/31/2020    HGB 14.0 03/31/2020   HCT 43.4 03/31/2020   PLT 292.0 03/31/2020   GLUCOSE 85 03/31/2020   CHOL 283 (H) 04/01/2018   TRIG 78.0 04/01/2018   HDL 61.60 04/01/2018   LDLCALC 206 (H) 04/01/2018   ALT 8 03/31/2020   AST 14 03/31/2020   NA 138 03/31/2020   K 4.0 03/31/2020   CL 102 03/31/2020   CREATININE 0.89 03/31/2020   BUN 17 03/31/2020   CO2 32 03/31/2020   TSH 6.26 (H) 03/31/2020    CT Head Wo Contrast  Result Date: 04/09/2018 CLINICAL DATA:  Altered level of consciousness EXAM: CT HEAD WITHOUT CONTRAST TECHNIQUE: Contiguous axial images were obtained from the base of the skull through the vertex without intravenous contrast. COMPARISON:  None. FINDINGS: Brain: No evidence of acute infarction, hemorrhage, extra-axial collection, ventriculomegaly, or mass effect. Generalized cerebral atrophy. Periventricular white matter low attenuation likely secondary to microangiopathy. Vascular: Cerebrovascular atherosclerotic calcifications are noted. Skull: Negative for fracture or focal lesion. Sinuses/Orbits: Visualized portions of the orbits are unremarkable. Visualized portions of the paranasal sinuses and mastoid air cells are unremarkable. Other: None. IMPRESSION: No acute intracranial pathology. Electronically Signed   By: Kathreen Devoid   On: 04/09/2018 14:57    Assessment & Plan:   There are no diagnoses linked to this encounter.   No orders of the defined types were placed in this encounter.    Follow-up: No follow-ups on file.  Walker Kehr, MD

## 2021-01-04 NOTE — Addendum Note (Signed)
Addended by: Boris Lown B on: 01/04/2021 03:10 PM   Modules accepted: Orders

## 2021-01-04 NOTE — Assessment & Plan Note (Signed)
Stable on Aricept, Namenda

## 2021-01-04 NOTE — Assessment & Plan Note (Signed)
Ongoing Cont w/carbs control

## 2021-01-05 ENCOUNTER — Encounter: Payer: Self-pay | Admitting: Internal Medicine

## 2021-01-05 ENCOUNTER — Other Ambulatory Visit: Payer: Self-pay

## 2021-01-05 ENCOUNTER — Telehealth: Payer: Self-pay | Admitting: Internal Medicine

## 2021-01-05 DIAGNOSIS — N183 Chronic kidney disease, stage 3 unspecified: Secondary | ICD-10-CM | POA: Insufficient documentation

## 2021-01-05 MED ORDER — DONEPEZIL HCL 10 MG PO TABS: 10.0000 mg | ORAL_TABLET | Freq: Every day | ORAL | 3 refills | Status: DC

## 2021-01-05 MED ORDER — MEMANTINE HCL ER 21 MG PO CP24: 21.0000 mg | ORAL_CAPSULE | Freq: Every day | ORAL | 3 refills | Status: DC

## 2021-01-05 MED ORDER — CARVEDILOL PHOSPHATE ER 20 MG PO CP24: 20.0000 mg | ORAL_CAPSULE | Freq: Every day | ORAL | 3 refills | Status: DC

## 2021-01-05 NOTE — Telephone Encounter (Signed)
Patient states she needs to make some changes to where her medications go and would like to speak about it with someome

## 2021-01-05 NOTE — Telephone Encounter (Signed)
Pt's sister states that Dina Rich is requesting her to complete paperwork for Namenda, however, she feels like it has not been a year & should be covered.  Will inquire.

## 2021-01-05 NOTE — Telephone Encounter (Signed)
Pt sister states that they do not use any [facility] pharmcies & need medications sent to [facility].  [place] removed from list & meds sent where requested.

## 2021-01-08 NOTE — Progress Notes (Signed)
  Subjective:  Patient ID: Deborah Myers, female    DOB: [birth year],  MRN: [id number]  Deborah Myers presents to clinic today for painful thick toenails that are difficult to trim. Pain interferes with ambulation. Aggravating factors include wearing enclosed shoe gear. Pain is relieved with periodic professional debridement.   Caregiver is present during today's visit.  PCP is Dr. Lew Dawes and last visit was 03/31/2020.  Allergies  Allergen Reactions  . Tiazac [Diltiazem]     edema    Review of Systems: Negative except as noted in the HPI. Objective:   Constitutional Deborah Myers is a pleasant 85 y.o. African American female, in NAD. AAO x 3.   Vascular Capillary refill time to digits immediate b/l. Palpable DP pulse(s) right lower extremity Faintly palpable DP pulse(s) left lower extremity. Faintly palpable PT pulse(s) b/l lower extremities. Pedal hair absent. Lower extremity skin temperature gradient within normal limits. Trace edema noted b/l lower extremities. No cyanosis or clubbing noted.  Neurologic  Patient unable to follow commands of LE neurological examination due to cognitive deficits. Patient does respond to external noxious stimuli. Proprioception intact bilaterally.  Dermatologic Pedal skin with normal turgor, texture and tone bilaterally. No open wounds bilaterally. No interdigital macerations bilaterally. Toenails 1-5 b/l elongated, discolored, dystrophic, thickened, crumbly with subungual debris and tenderness to dorsal palpation.  Orthopedic: Normal muscle strength 5/5 to all lower extremity muscle groups bilaterally. No pain crepitus or joint limitation noted with ROM b/l. Hallux valgus with bunion deformity noted b/l lower extremities. Hammertoes noted to the L 2nd toe and R 2nd toe.   Radiographs: None Assessment:   1. Pain due to onychomycosis of toenails of both feet   2. Hallux valgus, acquired, bilateral   3. Acquired hammertoes of both  feet    Plan:  Patient was evaluated and treated and all questions answered.  Onychomycosis with pain -Nails palliatively debridement as below -Educated on self-care  Procedure: Nail Debridement Rationale: Pain Type of Debridement: manual, sharp debridement. Instrumentation: Nail nipper, rotary burr. Number of Nails: 10 -Examined patient. -Patient to continue soft, supportive shoe gear daily. -Toenails 1-5 b/l were debrided in length and girth with sterile nail nippers and dremel without iatrogenic bleeding.  -Patient to report any pedal injuries to medical professional immediately. -Patient/POA to call should there be question/concern in the interim.  Return in about 3 months (around 04/01/2021).  Marzetta Board, DPM

## 2021-01-31 ENCOUNTER — Telehealth: Payer: Self-pay | Admitting: *Deleted

## 2021-01-31 ENCOUNTER — Other Ambulatory Visit: Payer: Self-pay | Admitting: *Deleted

## 2021-01-31 MED ORDER — DONEPEZIL HCL 10 MG PO TABS: 10.0000 mg | ORAL_TABLET | Freq: Every day | ORAL | 3 refills | Status: DC

## 2021-01-31 NOTE — Telephone Encounter (Signed)
Pt sister walk in needing  Pt assistance forms completed for pt Namenda XR. Completed forms and faxed to [facility]. Sister wanted original forms so I made copyt, abd gave original to pt sister.....Johny Chess

## 2021-02-01 MED ORDER — MEMANTINE HCL ER 21 MG PO CP24: 21.0000 mg | ORAL_CAPSULE | Freq: Every day | ORAL | 3 refills | Status: DC

## 2021-02-01 NOTE — Telephone Encounter (Signed)
Resent application w/ prescription.Marland KitchenJohny Myers

## 2021-02-03 NOTE — Telephone Encounter (Signed)
Rec'd letter back from [employer] pt assistance..pt has been approved for the Namenda through Dec 31, 22. Notified pt/sister w/approval status they should be receiving med within 7-10 days.Marland KitchenJohny Chess

## 2021-02-06 MED ORDER — MEMANTINE HCL ER 21 MG PO CP24: 21.0000 mg | ORAL_CAPSULE | Freq: Every day | ORAL | 0 refills | Status: DC

## 2021-02-06 NOTE — Addendum Note (Signed)
Addended by: Earnstine Regal on: 02/06/2021 02:04 PM   Modules accepted: Orders

## 2021-02-06 NOTE — Telephone Encounter (Signed)
Patient sister called and is requesting a call back in regards to memantine (NAMENDA XR) 21 MG CP24 24 hr capsule. She can be reached at [phone]

## 2021-02-06 NOTE — Telephone Encounter (Signed)
Called pt sister back [name]) she states pt took last pill today wanting to know what they ca do. Inform sister can send in 7 day supply to her local pharmacy until she receive medication. Sent rx to [facility].Marland KitchenJohny Chess

## 2021-02-06 NOTE — Telephone Encounter (Signed)
rec'd medication from the pt assistance program for pt Namenda. Warehouse order # [facility] DEL # [id number] #90 PILLS. Called pt sister [name]) there ws no answer. LMOM will leave at the front desk for pick-up.Marland KitchenJohny Chess

## 2021-04-12 ENCOUNTER — Encounter: Payer: Self-pay | Admitting: Podiatry

## 2021-04-12 ENCOUNTER — Other Ambulatory Visit: Payer: Self-pay

## 2021-04-12 ENCOUNTER — Ambulatory Visit: Payer: Medicare HMO | Admitting: Podiatry

## 2021-04-12 DIAGNOSIS — M79675 Pain in left toe(s): Secondary | ICD-10-CM

## 2021-04-12 DIAGNOSIS — M79674 Pain in right toe(s): Secondary | ICD-10-CM

## 2021-04-12 DIAGNOSIS — B351 Tinea unguium: Secondary | ICD-10-CM

## 2021-04-14 ENCOUNTER — Telehealth: Payer: Self-pay | Admitting: Internal Medicine

## 2021-04-14 NOTE — Telephone Encounter (Signed)
Patient saw Dr. Rogers Blocker this week. Noticed ankles swollen  Dr Rogers Blocker suggested a script for socks/stockings to help  POA [name] would like a call  [phone] before five [phone] after five

## 2021-04-15 NOTE — Telephone Encounter (Signed)
Please get over-the-counter support compression knee-high's or sleeves.  For them on the morning take them off at night.  Prescription ones are very hard to put on and to wear.  Thanks

## 2021-04-16 NOTE — Progress Notes (Signed)
  Subjective:  Patient ID: Deborah Myers, female    DOB: 18-Apr-1936,  MRN: [id number]  85 y.o. female presents thick, elongated toenails b/l feet which are tender when wearing enclosed shoe gear.  She is concerned about swelling of her ankles. She has worn compression hose in the past.   Allergies  Allergen Reactions  . Tiazac [Diltiazem]     edema   Review of Systems: Negative except as noted in the HPI.   Objective:   Constitutional Pt is a pleasant 85 y.o. African American female in NAD. AAO x 3.   Vascular Capillary refill time to digits immediate b/l. Palpable DP pulse(s) right lower extremity Faintly palpable DP pulse(s) left lower extremity. Faintly palpable PT pulse(s) b/l lower extremities. Pedal hair present. Lower extremity skin temperature gradient within normal limits. Nonpitting edema noted left ankle and right ankle.  Neurologic Protective sensation intact 5/5 intact bilaterally with 10g monofilament b/l.  Dermatologic Pedal skin with normal turgor, texture and tone bilaterally. No open wounds bilaterally. No interdigital macerations bilaterally. Toenails 1-5 b/l elongated, discolored, dystrophic, thickened, crumbly with subungual debris and tenderness to dorsal palpation.  Orthopedic: Normal muscle strength 5/5 to all lower extremity muscle groups bilaterally. No pain crepitus or joint limitation noted with ROM b/l. Hallux valgus with bunion deformity noted b/l lower extremities. Hammertoe(s) noted to the L 2nd toe and R 2nd toe.   Radiographs: None Assessment:   1. Pain due to onychomycosis of toenails of both feet    Plan:  Patient was evaluated and treated and all questions answered.  Onychomycosis with pain -Nails palliatively debridement as below. -Educated on self-care  Procedure: Nail Debridement Rationale: Pain Type of Debridement: manual, sharp debridement. Instrumentation: Nail nipper, rotary burr. Number of Nails: 10  -Examined patient. -Patient  to continue soft, supportive shoe gear daily. -Toenails 1-5 b/l were debrided in length and girth with sterile nail nippers and dremel without iatrogenic bleeding.  -Patient to report any pedal injuries to medical professional immediately. -Advised her to discuss use of compression hose with her PCP. -Patient/POA to call should there be question/concern in the interim.  Return in about 3 months (around 07/13/2021).  Marzetta Board, DPM

## 2021-04-18 NOTE — Telephone Encounter (Signed)
Notified POA (joyce) w/MD response.Marland KitchenJohny Chess

## 2021-04-25 ENCOUNTER — Telehealth: Payer: Self-pay | Admitting: *Deleted

## 2021-04-25 NOTE — Telephone Encounter (Signed)
Rec'd pt Namenda in from [facility] # 90 pills . Warehouse order # [facility] Del # [id number]. Called sister [name]) inform ready for pick-up.Marland KitchenJohny Chess

## 2021-05-04 ENCOUNTER — Ambulatory Visit: Payer: Medicare HMO | Admitting: Internal Medicine

## 2021-05-11 ENCOUNTER — Encounter: Payer: Self-pay | Admitting: Internal Medicine

## 2021-05-11 ENCOUNTER — Ambulatory Visit (INDEPENDENT_AMBULATORY_CARE_PROVIDER_SITE_OTHER): Payer: Medicare HMO | Admitting: Internal Medicine

## 2021-05-11 ENCOUNTER — Other Ambulatory Visit: Payer: Self-pay

## 2021-05-11 DIAGNOSIS — I10 Essential (primary) hypertension: Secondary | ICD-10-CM

## 2021-05-11 DIAGNOSIS — G309 Alzheimer's disease, unspecified: Secondary | ICD-10-CM

## 2021-05-11 DIAGNOSIS — R635 Abnormal weight gain: Secondary | ICD-10-CM | POA: Diagnosis not present

## 2021-05-11 DIAGNOSIS — F0280 Dementia in other diseases classified elsewhere without behavioral disturbance: Secondary | ICD-10-CM

## 2021-05-11 DIAGNOSIS — E785 Hyperlipidemia, unspecified: Secondary | ICD-10-CM

## 2021-05-11 DIAGNOSIS — N183 Chronic kidney disease, stage 3 unspecified: Secondary | ICD-10-CM

## 2021-05-11 LAB — BASIC METABOLIC PANEL
BUN: 22 mg/dL (ref 6–23)
CO2: 28 mEq/L (ref 19–32)
Calcium: 9.8 mg/dL (ref 8.4–10.5)
Chloride: 104 mEq/L (ref 96–112)
Creatinine, Ser: 1.09 mg/dL (ref 0.40–1.20)
GFR: 47.07 mL/min — ABNORMAL LOW (ref >60.00)
Glucose, Bld: 75 mg/dL (ref 70–99)
Potassium: 3.6 mEq/L (ref 3.5–5.1)
Sodium: 140 mEq/L (ref 135–145)

## 2021-05-11 NOTE — Progress Notes (Signed)
Subjective:  Patient ID: Deborah Myers, female    DOB: 05/01/1936  Age: 85 y.o. MRN: [id number]  CC: Follow-up (4 month f/u)   HPI Deborah Myers presents for dementia, HTN C/o wt gain She is here w/her sister - who provides hx  Outpatient Medications Prior to Visit  Medication Sig Dispense Refill   b complex vitamins tablet Take 1 tablet by mouth daily. 100 tablet 3   carvedilol (COREG CR) 20 MG 24 hr capsule Take 1 capsule (20 mg total) by mouth daily. 90 capsule 3   Cholecalciferol (VITAMIN D3) 50 MCG (2000 UT) capsule Take 1 capsule (2,000 Units total) by mouth daily. 100 capsule 3   donepezil (ARICEPT) 10 MG tablet Take 1 tablet (10 mg total) by mouth at bedtime. 90 tablet 3   memantine (NAMENDA XR) 21 MG CP24 24 hr capsule Take 1 capsule (21 mg total) by mouth daily. Week supply until she received pt assistance 7 capsule 0   No facility-administered medications prior to visit.    ROS: Review of Systems  Constitutional:  Negative for activity change, appetite change, chills, fatigue and unexpected weight change.  HENT:  Negative for congestion, mouth sores and sinus pressure.   Eyes:  Negative for visual disturbance.  Respiratory:  Negative for cough and chest tightness.   Gastrointestinal:  Negative for abdominal pain and nausea.  Genitourinary:  Negative for difficulty urinating, frequency and vaginal pain.  Musculoskeletal:  Negative for back pain and gait problem.  Skin:  Negative for pallor and rash.  Neurological:  Negative for dizziness, tremors, weakness, numbness and headaches.  Psychiatric/Behavioral:  Positive for confusion and decreased concentration. Negative for behavioral problems and sleep disturbance.    Objective:  BP 132/82 (BP Location: Left Arm)   Pulse 60   Temp 98.4 F (36.9 C) (Oral)   Ht 5\' 7"  (1.702 m)   Wt 189 lb (85.7 kg)   SpO2 98%   BMI 29.60 kg/m   BP Readings from Last 3 Encounters:  05/11/21 132/82  01/04/21 138/84   03/31/20 140/90    Wt Readings from Last 3 Encounters:  05/11/21 189 lb (85.7 kg)  01/04/21 184 lb 9.6 oz (83.7 kg)  03/31/20 173 lb (78.5 kg)    Physical Exam Constitutional:      General: She is not in acute distress.    Appearance: She is well-developed. She is obese.  HENT:     Head: Normocephalic.     Right Ear: External ear normal.     Left Ear: External ear normal.     Nose: Nose normal.  Eyes:     General:        Right eye: No discharge.        Left eye: No discharge.     Conjunctiva/sclera: Conjunctivae normal.     Pupils: Pupils are equal, round, and reactive to light.  Neck:     Thyroid: No thyromegaly.     Vascular: No JVD.     Trachea: No tracheal deviation.  Cardiovascular:     Rate and Rhythm: Normal rate and regular rhythm.     Heart sounds: Normal heart sounds.  Pulmonary:     Effort: No respiratory distress.     Breath sounds: No stridor. No wheezing.  Abdominal:     General: Bowel sounds are normal. There is no distension.     Palpations: Abdomen is soft. There is no mass.     Tenderness: There is no abdominal tenderness. There  is no guarding or rebound.  Musculoskeletal:        General: No tenderness.     Cervical back: Normal range of motion and neck supple. No rigidity.  Lymphadenopathy:     Cervical: No cervical adenopathy.  Skin:    Findings: No erythema or rash.  Neurological:     Cranial Nerves: No cranial nerve deficit.     Motor: No abnormal muscle tone.     Coordination: Coordination normal.     Deep Tendon Reflexes: Reflexes normal.  Psychiatric:        Behavior: Behavior normal.   Ankles w/trace edema  Lab Results  Component Value Date   WBC 7.5 03/31/2020   HGB 14.0 03/31/2020   HCT 43.4 03/31/2020   PLT 292.0 03/31/2020   GLUCOSE 76 01/04/2021   CHOL 283 (H) 04/01/2018   TRIG 78.0 04/01/2018   HDL 61.60 04/01/2018   LDLCALC 206 (H) 04/01/2018   ALT 8 01/04/2021   AST 12 01/04/2021   NA 143 01/04/2021   K 4.0  01/04/2021   CL 104 01/04/2021   CREATININE 1.01 01/04/2021   BUN 15 01/04/2021   CO2 32 01/04/2021   TSH 3.70 01/04/2021   HGBA1C 5.5 01/04/2021    CT Head Wo Contrast  Result Date: 04/09/2018 CLINICAL DATA:  Altered level of consciousness EXAM: CT HEAD WITHOUT CONTRAST TECHNIQUE: Contiguous axial images were obtained from the base of the skull through the vertex without intravenous contrast. COMPARISON:  None. FINDINGS: Brain: No evidence of acute infarction, hemorrhage, extra-axial collection, ventriculomegaly, or mass effect. Generalized cerebral atrophy. Periventricular white matter low attenuation likely secondary to microangiopathy. Vascular: Cerebrovascular atherosclerotic calcifications are noted. Skull: Negative for fracture or focal lesion. Sinuses/Orbits: Visualized portions of the orbits are unremarkable. Visualized portions of the paranasal sinuses and mastoid air cells are unremarkable. Other: None. IMPRESSION: No acute intracranial pathology. Electronically Signed   By: Kathreen Devoid   On: 04/09/2018 14:57    Assessment & Plan:   There are no diagnoses linked to this encounter.   No orders of the defined types were placed in this encounter.    Follow-up: No follow-ups on file.  Walker Kehr, MD

## 2021-05-11 NOTE — Addendum Note (Signed)
Addended by: Boris Lown B on: 05/11/2021 02:52 PM   Modules accepted: Orders

## 2021-05-11 NOTE — Assessment & Plan Note (Signed)
Monitor GFR 

## 2021-05-11 NOTE — Assessment & Plan Note (Signed)
  Cont w/Aricept, Namenda Isolation is affecting the pt negatively.

## 2021-05-11 NOTE — Assessment & Plan Note (Signed)
Wt loss, better diet, activity discussed

## 2021-05-11 NOTE — Patient Instructions (Signed)
Cortaid 1% cream for the ear  Ankle sleeves or socks for swelling

## 2021-05-11 NOTE — Assessment & Plan Note (Signed)
On Coreg CR. Cont w/NAS diet, loose wt

## 2021-05-29 ENCOUNTER — Telehealth: Payer: Self-pay | Admitting: Internal Medicine

## 2021-05-29 NOTE — Telephone Encounter (Signed)
LVM for pt to rtn my call to schedule AWV with [facility]. Please schedule AWV if pt calls the office  

## 2021-07-13 ENCOUNTER — Telehealth: Payer: Self-pay

## 2021-07-14 MED ORDER — CARVEDILOL PHOSPHATE ER 20 MG PO CP24: 20.0000 mg | ORAL_CAPSULE | Freq: Every day | ORAL | 3 refills | Status: DC

## 2021-07-14 NOTE — Telephone Encounter (Signed)
Rx sent to [facility].Marland KitchenJohny Chess

## 2021-07-19 ENCOUNTER — Ambulatory Visit (INDEPENDENT_AMBULATORY_CARE_PROVIDER_SITE_OTHER): Payer: Medicare HMO | Admitting: Podiatry

## 2021-07-19 ENCOUNTER — Telehealth: Payer: Self-pay | Admitting: *Deleted

## 2021-07-19 ENCOUNTER — Other Ambulatory Visit: Payer: Self-pay

## 2021-07-19 DIAGNOSIS — M79675 Pain in left toe(s): Secondary | ICD-10-CM | POA: Diagnosis not present

## 2021-07-19 DIAGNOSIS — B351 Tinea unguium: Secondary | ICD-10-CM | POA: Diagnosis not present

## 2021-07-19 DIAGNOSIS — M79674 Pain in right toe(s): Secondary | ICD-10-CM | POA: Diagnosis not present

## 2021-07-19 NOTE — Telephone Encounter (Signed)
Patient's caretaker is calling and wanted to know if they should keep their appointment this afternoon, has been exposed to Covid at church. They have no symptoms and will wear mask for the visit. Returned the call and explained per clinical nurse (Tonika)that it would be ok as long as no symptoms and wearing mask for visit.

## 2021-07-21 ENCOUNTER — Telehealth: Payer: Self-pay | Admitting: Internal Medicine

## 2021-07-24 ENCOUNTER — Encounter: Payer: Self-pay | Admitting: Podiatry

## 2021-07-24 NOTE — Progress Notes (Signed)
Subjective: Deborah Myers is a pleasant 85 y.o. female patient seen today for painful thick toenails that are difficult to trim. Pain interferes with ambulation. Aggravating factors include wearing enclosed shoe gear. Pain is relieved with periodic professional debridement.   PCP is Plotnikov, Evie Lacks, MD. Last visit was: 05/11/2021.  Allergies  Allergen Reactions   Tiazac [Diltiazem]     edema    Objective: Physical Exam  General: Deborah Myers is a pleasant 85 y.o. African American female, in NAD. AAO x 3.   Vascular:  Capillary refill time to digits immediate b/l. Palpable DP pulse(s) right lower extremity Faintly palpable DP pulse(s) left lower extremity. Faintly palpable PT pulse(s) b/l lower extremities. Pedal hair present. Lower extremity skin temperature gradient within normal limits. Nonpitting edema noted bilateral ankles.  Dermatological:  Skin warm and supple b/l lower extremities. No open wounds b/l lower extremities. No interdigital macerations b/l lower extremities. Toenails 1-5 b/l elongated, discolored, dystrophic, thickened, crumbly with subungual debris and tenderness to dorsal palpation.  Musculoskeletal:  Normal muscle strength 5/5 to all lower extremity muscle groups bilaterally. Hallux valgus with bunion deformity noted b/l lower extremities. Hammertoe(s) noted to the L 2nd toe and R 2nd toe.  Neurological:  Protective sensation intact 5/5 intact bilaterally with 10g monofilament b/l.  Assessment and Plan:  1. Pain due to onychomycosis of toenails of both feet      -No new findings. No new orders. -Patient to continue soft, supportive shoe gear daily. -Toenails 1-5 b/l were debrided in length and girth with sterile nail nippers and dremel without iatrogenic bleeding.  -Patient to report any pedal injuries to medical professional immediately. -Patient/POA to call should there be question/concern in the interim.  Return in about 3 months (around  10/18/2021).  Marzetta Board, DPM

## 2021-07-25 NOTE — Telephone Encounter (Signed)
Duplicate refill already sent 07/14/21.Marland KitchenJohny Myers

## 2021-07-28 ENCOUNTER — Telehealth: Payer: Self-pay

## 2021-07-31 ENCOUNTER — Telehealth: Payer: Self-pay

## 2021-07-31 MED ORDER — DONEPEZIL HCL 10 MG PO TABS: 10.0000 mg | ORAL_TABLET | Freq: Every day | ORAL | 2 refills | Status: DC

## 2021-07-31 MED ORDER — ATENOLOL 25 MG PO TABS: 25.0000 mg | ORAL_TABLET | Freq: Every day | ORAL | 3 refills | Status: DC

## 2021-07-31 NOTE — Telephone Encounter (Signed)
Please advise on carvedilol... Never received msg  to order Memantine pt assistance. Will reorder today.Marland KitchenJohny Chess

## 2021-07-31 NOTE — Telephone Encounter (Signed)
Per chart refill was sent back in March for year supply. Resent rx to [facility].Marland KitchenJohny Chess

## 2021-07-31 NOTE — Telephone Encounter (Signed)
Please advise as the pts sister has stated that the medication cost for thecarvedilol (COREG CR) 20 MG 24 hr capsule  is to high and is wanting to know if something else can be sent in place of it.  **Pt sister is also wanting to know if the pts memantine (NAMENDA XR) 21 MG CP24 24 hr capsule has arrived at the office as she has stated she called a week ago for the pts refill.  Pt sister [name] can be reached at [phone].

## 2021-07-31 NOTE — Telephone Encounter (Signed)
ForWe will switch to atenolol.  Prescription atenolol was emailed to [facility]: Lambert Rhonda W.  Thanks

## 2021-07-31 NOTE — Addendum Note (Signed)
Addended by: Cassandria Anger on: 07/31/2021 11:31 PM   Modules accepted: Orders

## 2021-08-01 NOTE — Telephone Encounter (Signed)
Called [facility] to order Memantine. Rep states they received called on 07/21/21 for refill. Order was placed on 07/28/21, and is waiting ti be shipped out. He states it should be ship today. Called sister gave her status on the pt assisting, also inform her med change from Dr. Alain Marion see msg below.Marland KitchenAndee Poles

## 2021-08-03 ENCOUNTER — Other Ambulatory Visit: Payer: Self-pay | Admitting: *Deleted

## 2021-08-03 MED ORDER — ATENOLOL 25 MG PO TABS: 25.0000 mg | ORAL_TABLET | Freq: Every day | ORAL | 0 refills | Status: DC

## 2021-08-03 NOTE — Telephone Encounter (Signed)
Called sister back there was no answer LMOM will send a 2 week supply of the atenolol to start since MD change med. Rx has been sent to [facility].Marland KitchenJohny Chess

## 2021-08-03 NOTE — Telephone Encounter (Signed)
Patient is out of carvedilol, what should she do until her other medicine arrives?  She has been out for two days  Please call [name] [phone]

## 2021-08-04 NOTE — Telephone Encounter (Signed)
Called pt sister [name]) there was no answer LMOM received Pt Assistance " Namenda" in mail today. Will leave up front for pick up. #90 pills. [facility] DELIVERY # [id number].Marland KitchenChryl Heck

## 2021-09-11 ENCOUNTER — Other Ambulatory Visit: Payer: Self-pay

## 2021-09-11 ENCOUNTER — Ambulatory Visit (INDEPENDENT_AMBULATORY_CARE_PROVIDER_SITE_OTHER): Payer: Medicare HMO | Admitting: Internal Medicine

## 2021-09-11 ENCOUNTER — Encounter: Payer: Self-pay | Admitting: Internal Medicine

## 2021-09-11 VITALS — BP 152/86 | HR 54 | Temp 98.2°F | Ht 67.0 in | Wt 193.6 lb

## 2021-09-11 DIAGNOSIS — R635 Abnormal weight gain: Secondary | ICD-10-CM | POA: Diagnosis not present

## 2021-09-11 DIAGNOSIS — Z23 Encounter for immunization: Secondary | ICD-10-CM

## 2021-09-11 DIAGNOSIS — F0280 Dementia in other diseases classified elsewhere without behavioral disturbance: Secondary | ICD-10-CM | POA: Diagnosis not present

## 2021-09-11 DIAGNOSIS — N183 Chronic kidney disease, stage 3 unspecified: Secondary | ICD-10-CM

## 2021-09-11 DIAGNOSIS — G309 Alzheimer's disease, unspecified: Secondary | ICD-10-CM

## 2021-09-11 DIAGNOSIS — I10 Essential (primary) hypertension: Secondary | ICD-10-CM | POA: Diagnosis not present

## 2021-09-11 LAB — COMPREHENSIVE METABOLIC PANEL
ALT: 7 U/L (ref 0–35)
AST: 15 U/L (ref 0–37)
Albumin: 4.3 g/dL (ref 3.5–5.2)
Alkaline Phosphatase: 71 U/L (ref 39–117)
BUN: 22 mg/dL (ref 6–23)
CO2: 27 mEq/L (ref 19–32)
Calcium: 10.3 mg/dL (ref 8.4–10.5)
Chloride: 106 mEq/L (ref 96–112)
Creatinine, Ser: 1.21 mg/dL — ABNORMAL HIGH (ref 0.40–1.20)
GFR: 41.43 mL/min — ABNORMAL LOW (ref >60.00)
Glucose, Bld: 75 mg/dL (ref 70–99)
Potassium: 3.5 mEq/L (ref 3.5–5.1)
Sodium: 143 mEq/L (ref 135–145)
Total Bilirubin: 0.6 mg/dL (ref 0.2–1.2)
Total Protein: 8.5 g/dL — ABNORMAL HIGH (ref 6.0–8.3)

## 2021-09-11 LAB — TSH: TSH: 9.13 u[IU]/mL — ABNORMAL HIGH (ref 0.35–5.50)

## 2021-09-11 MED ORDER — MEMANTINE HCL 10 MG PO TABS: 10.0000 mg | ORAL_TABLET | Freq: Two times a day (BID) | ORAL | 3 refills | Status: DC

## 2021-09-11 NOTE — Progress Notes (Signed)
Subjective:  Patient ID: Deborah Myers, female    DOB: [birth year]  Age: 85 y.o. MRN: [id number]  CC: Follow-up (4 month f/u- Flu shot)   HPI Deborah Myers presents for dementia, wt gain, HTN f/u Comes w/sister Deborah Myers who is helping w/HPI  Outpatient Medications Prior to Visit  Medication Sig Dispense Refill   atenolol (TENORMIN) 25 MG tablet Take 1 tablet (25 mg total) by mouth daily. Enough until received mail order 14 tablet 0   b complex vitamins tablet Take 1 tablet by mouth daily. 100 tablet 3   Cholecalciferol (VITAMIN D3) 50 MCG (2000 UT) capsule Take 1 capsule (2,000 Units total) by mouth daily. 100 capsule 3   donepezil (ARICEPT) 10 MG tablet Take 1 tablet (10 mg total) by mouth at bedtime. 90 tablet 2   memantine (NAMENDA XR) 21 MG CP24 24 hr capsule Take 1 capsule (21 mg total) by mouth daily. Week supply until she received pt assistance 7 capsule 0   No facility-administered medications prior to visit.    ROS: Review of Systems  Constitutional:  Positive for unexpected weight change. Negative for activity change, appetite change, chills and fatigue.  HENT:  Negative for congestion, mouth sores and sinus pressure.   Eyes:  Negative for visual disturbance.  Respiratory:  Negative for cough and chest tightness.   Gastrointestinal:  Negative for abdominal pain and nausea.  Genitourinary:  Negative for difficulty urinating, frequency and vaginal pain.  Musculoskeletal:  Positive for gait problem. Negative for back pain.  Skin:  Negative for pallor and rash.  Neurological:  Negative for dizziness, tremors, weakness, numbness and headaches.  Psychiatric/Behavioral:  Positive for confusion and decreased concentration. Negative for sleep disturbance.    Objective:  BP (!) 152/86 (BP Location: Left Arm)   Pulse (!) 54   Temp 98.2 F (36.8 C) (Oral)   Ht 5\' 7"  (1.702 m)   Wt 193 lb 9.6 oz (87.8 kg)   SpO2 98%   BMI 30.32 kg/m   BP Readings from Last 3  Encounters:  09/11/21 (!) 152/86  05/11/21 132/82  01/04/21 138/84    Wt Readings from Last 3 Encounters:  09/11/21 193 lb 9.6 oz (87.8 kg)  05/11/21 189 lb (85.7 kg)  01/04/21 184 lb 9.6 oz (83.7 kg)    Physical Exam Constitutional:      General: She is not in acute distress.    Appearance: She is well-developed. She is obese.  HENT:     Head: Normocephalic.     Right Ear: External ear normal.     Left Ear: External ear normal.     Nose: Nose normal.  Eyes:     General:        Right eye: No discharge.        Left eye: No discharge.     Conjunctiva/sclera: Conjunctivae normal.     Pupils: Pupils are equal, round, and reactive to light.  Neck:     Thyroid: No thyromegaly.     Vascular: No JVD.     Trachea: No tracheal deviation.  Cardiovascular:     Rate and Rhythm: Normal rate and regular rhythm.     Heart sounds: Normal heart sounds.  Pulmonary:     Effort: No respiratory distress.     Breath sounds: No stridor. No wheezing.  Abdominal:     General: Bowel sounds are normal. There is no distension.     Palpations: Abdomen is soft. There is no mass.  Tenderness: There is no abdominal tenderness. There is no guarding or rebound.  Musculoskeletal:        General: No tenderness.     Cervical back: Normal range of motion and neck supple. No rigidity.  Lymphadenopathy:     Cervical: No cervical adenopathy.  Skin:    Findings: No erythema or rash.  Neurological:     Mental Status: She is disoriented.     Cranial Nerves: No cranial nerve deficit.     Motor: No abnormal muscle tone.     Coordination: Coordination abnormal.     Gait: Gait abnormal.     Deep Tendon Reflexes: Reflexes normal.  Psychiatric:        Behavior: Behavior normal.        Thought Content: Thought content normal.        Judgment: Judgment normal.  Using a cane  Lab Results  Component Value Date   WBC 7.5 03/31/2020   HGB 14.0 03/31/2020   HCT 43.4 03/31/2020   PLT 292.0 03/31/2020    GLUCOSE 75 05/11/2021   CHOL 283 (H) 04/01/2018   TRIG 78.0 04/01/2018   HDL 61.60 04/01/2018   LDLCALC 206 (H) 04/01/2018   ALT 8 01/04/2021   AST 12 01/04/2021   NA 140 05/11/2021   K 3.6 05/11/2021   CL 104 05/11/2021   CREATININE 1.09 05/11/2021   BUN 22 05/11/2021   CO2 28 05/11/2021   TSH 3.70 01/04/2021   HGBA1C 5.5 01/04/2021    CT Head Wo Contrast  Result Date: 04/09/2018 CLINICAL DATA:  Altered level of consciousness EXAM: CT HEAD WITHOUT CONTRAST TECHNIQUE: Contiguous axial images were obtained from the base of the skull through the vertex without intravenous contrast. COMPARISON:  None. FINDINGS: Brain: No evidence of acute infarction, hemorrhage, extra-axial collection, ventriculomegaly, or mass effect. Generalized cerebral atrophy. Periventricular white matter low attenuation likely secondary to microangiopathy. Vascular: Cerebrovascular atherosclerotic calcifications are noted. Skull: Negative for fracture or focal lesion. Sinuses/Orbits: Visualized portions of the orbits are unremarkable. Visualized portions of the paranasal sinuses and mastoid air cells are unremarkable. Other: None. IMPRESSION: No acute intracranial pathology. Electronically Signed   By: Kathreen Devoid   On: 04/09/2018 14:57    Assessment & Plan:   Problem List Items Addressed This Visit     Alzheimer disease ([place])    Cont on Aricept, Namenda Cut back on carbs due to wt gain      Relevant Medications   memantine (NAMENDA) 10 MG tablet   CRF (chronic renal failure), stage 3 (moderate) (HCC)    Cont to monitor GFR Hydrate well      Essential hypertension    Check BP at home      Relevant Orders   Comprehensive metabolic panel   Weight gain - Primary    Aricept, Namenda Cut back on carbs due to wt gain Check TSH      Relevant Orders   Comprehensive metabolic panel   TSH   Other Visit Diagnoses     Needs flu shot       Relevant Orders   Flu Vaccine QUAD High Dose(Fluad)  (Completed)         Meds ordered this encounter  Medications   memantine (NAMENDA) 10 MG tablet    Sig: Take 1 tablet (10 mg total) by mouth 2 (two) times daily.    Dispense:  180 tablet    Refill:  3       Follow-up: Return in about 4 months (  around 01/12/2022) for a follow-up visit.  Walker Kehr, MD

## 2021-09-11 NOTE — Addendum Note (Signed)
Addended by: Jacobo Forest on: 09/11/2021 02:38 PM   Modules accepted: Orders

## 2021-09-11 NOTE — Assessment & Plan Note (Signed)
Cont on Aricept, Namenda Cut back on carbs due to wt gain

## 2021-09-11 NOTE — Assessment & Plan Note (Addendum)
Aricept, Namenda Cut back on carbs due to wt gain Check TSH

## 2021-09-11 NOTE — Assessment & Plan Note (Signed)
Check BP at home

## 2021-09-11 NOTE — Assessment & Plan Note (Signed)
Cont to monitor GFR Hydrate well

## 2021-09-12 ENCOUNTER — Other Ambulatory Visit (INDEPENDENT_AMBULATORY_CARE_PROVIDER_SITE_OTHER): Payer: Medicare HMO

## 2021-09-12 DIAGNOSIS — R635 Abnormal weight gain: Secondary | ICD-10-CM | POA: Diagnosis not present

## 2021-09-12 DIAGNOSIS — R7989 Other specified abnormal findings of blood chemistry: Secondary | ICD-10-CM | POA: Diagnosis not present

## 2021-09-12 LAB — T4, FREE: Free T4: 0.74 ng/dL (ref 0.60–1.60)

## 2021-09-13 DIAGNOSIS — H524 Presbyopia: Secondary | ICD-10-CM | POA: Diagnosis not present

## 2021-09-13 DIAGNOSIS — Z961 Presence of intraocular lens: Secondary | ICD-10-CM | POA: Diagnosis not present

## 2021-10-27 ENCOUNTER — Ambulatory Visit: Payer: Medicare HMO | Admitting: Podiatry

## 2021-10-27 ENCOUNTER — Other Ambulatory Visit: Payer: Self-pay

## 2021-10-27 ENCOUNTER — Telehealth: Payer: Self-pay | Admitting: Internal Medicine

## 2021-10-27 DIAGNOSIS — B351 Tinea unguium: Secondary | ICD-10-CM

## 2021-10-27 DIAGNOSIS — M79675 Pain in left toe(s): Secondary | ICD-10-CM | POA: Diagnosis not present

## 2021-10-27 DIAGNOSIS — M79674 Pain in right toe(s): Secondary | ICD-10-CM

## 2021-10-27 NOTE — Telephone Encounter (Signed)
Called sister [name]) there was no answer LMOM pt assistance for Namenda is ready for pick-up 3 bottles. Will leave at front desk to for pick-up.Marland KitchenJohny Chess

## 2021-10-27 NOTE — Telephone Encounter (Signed)
Patient Assistance came in the mail 10/27/21. Placed in providers box.

## 2021-11-01 ENCOUNTER — Telehealth: Payer: Self-pay | Admitting: *Deleted

## 2021-11-01 MED ORDER — MEMANTINE HCL 10 MG PO TABS: 10.0000 mg | ORAL_TABLET | Freq: Two times a day (BID) | ORAL | 3 refills | Status: DC

## 2021-11-01 NOTE — Telephone Encounter (Signed)
Sister [name]) brought Pt Asstance form to be fill out for pt Namenda. Completed form & printed rx for Namenda. Place in purple folder for MD to sign.Marland KitchenJohny Myers

## 2021-11-02 ENCOUNTER — Encounter: Payer: Self-pay | Admitting: Podiatry

## 2021-11-02 NOTE — Telephone Encounter (Signed)
Duplicate msg.. See previous msg. Sister dropped forms off on Tues of this week. There is a protocol 7-14 days on paper work. MD has forms will call sister when ready for pick-up,,/lmb

## 2021-11-02 NOTE — Telephone Encounter (Signed)
Patient sister Deborah Myers calling in  Calling in to confirm if provider has completed the patient assistance forms that she brought into the office last week  Please confirm & call back [phone]

## 2021-11-02 NOTE — Progress Notes (Signed)
°  Subjective:  Patient ID: Deborah Myers, female    DOB: 1936/01/14,  MRN: [id number]  Deborah Myers presents to clinic today for painful elongated mycotic toenails 1-5 bilaterally which are tender when wearing enclosed shoe gear. Pain is relieved with periodic professional debridement.  Patient relates no new pedal problems on today's visit.  PCP is Plotnikov, Evie Lacks, MD , and last visit was 05/11/2021.  Allergies  Allergen Reactions   Tiazac [Diltiazem]     edema    Review of Systems: Negative except as noted in the HPI. Objective:   Constitutional Deborah Myers is a pleasant 85 y.o. African American female, WD, WN in NAD. AAO x 3.   Vascular Capillary refill time to digits immediate b/l. Palpable DP pulse(s) right lower extremity Faintly palpable DP pulse(s) left lower extremity. Faintly palpable PT pulse(s) b/l LE. Pedal hair present. No pain with calf compression b/l. Nonpitting edema noted BLE.  Neurologic Normal speech. Oriented to person, place, and time. Protective sensation intact 5/5 intact bilaterally with 10g monofilament b/l.  Dermatologic Pedal integument with normal turgor, texture and tone b/l LE. No open wounds b/l. No interdigital macerations b/l. Toenails 1-5 b/l elongated, thickened, discolored with subungual debris. +Tenderness with dorsal palpation of nailplates. No hyperkeratotic or porokeratotic lesions present.  Orthopedic: Normal muscle strength 5/5 to all lower extremity muscle groups bilaterally. HAV with bunion deformity noted b/l LE. Hammertoe(s) noted to the bilateral 2nd toes.. No pain, crepitus or joint limitation noted with ROM b/l LE.  Patient ambulates independently without assistive aids.   Radiographs: None  Last A1c:  Hemoglobin A1C Latest Ref Rng & Units 01/04/2021  HGBA1C 4.6 - 6.5 % 5.5  Some recent data might be hidden    Assessment:   1. Pain due to onychomycosis of toenails of both feet    Plan:  Patient was evaluated  and treated and all questions answered. Consent given for treatment as described below: -Examined patient. -Mycotic toenails 1-5 bilaterally were debrided in length and girth with sterile nail nippers and dremel without incident. -Patient/POA to call should there be question/concern in the interim.  Return in about 3 months (around 01/25/2022).  Marzetta Board, DPM

## 2021-11-03 NOTE — Telephone Encounter (Signed)
Called pt sister there was no answer LMOM form ready for pick-up.Marland KitchenJohny Myers

## 2021-11-03 NOTE — Telephone Encounter (Signed)
I have probably signed it.  Thanks

## 2021-11-29 ENCOUNTER — Telehealth: Payer: Self-pay | Admitting: *Deleted

## 2021-11-29 MED ORDER — MEMANTINE HCL 10 MG PO TABS: 10.0000 mg | ORAL_TABLET | Freq: Two times a day (BID) | ORAL | 3 refills | Status: DC

## 2021-11-29 NOTE — Telephone Encounter (Signed)
Rec'd fax from [place] [employer] stating the application was missing the provider address. Per application address was written on correctly. Printed rx w/ pt address and faxed to [facility].Marland KitchenJohny Chess

## 2021-12-13 ENCOUNTER — Telehealth: Payer: Self-pay | Admitting: Internal Medicine

## 2021-12-13 NOTE — Telephone Encounter (Signed)
Noted../llmb

## 2021-12-13 NOTE — Telephone Encounter (Signed)
Patient's sister informing provider she will drop of an assessment form pertaining to patient's assistant living  Caller requesting to pick form up once completed

## 2021-12-14 NOTE — Telephone Encounter (Signed)
Rec'd form for [facility] completed MD portion placed in purple folder for completion.Marland KitchenJohny Myers

## 2021-12-15 NOTE — Telephone Encounter (Signed)
MD signed notified sister forms are ready for pick-up.Marland KitchenJohny Myers

## 2021-12-18 NOTE — Telephone Encounter (Signed)
Rep w/ [facility] states pg 2 of medical form received on 12-15-2021 is missing "special diet" portion that is required  Rep states form can be faxed to [phone] attn: nicole reynold

## 2021-12-18 NOTE — Telephone Encounter (Signed)
Faxed form back to [place] w/ completion of special diet.Marland KitchenJohny Myers

## 2022-01-08 ENCOUNTER — Other Ambulatory Visit: Payer: Self-pay

## 2022-01-08 ENCOUNTER — Ambulatory Visit (INDEPENDENT_AMBULATORY_CARE_PROVIDER_SITE_OTHER): Payer: Medicare HMO | Admitting: Internal Medicine

## 2022-01-08 ENCOUNTER — Encounter: Payer: Self-pay | Admitting: Internal Medicine

## 2022-01-08 VITALS — BP 140/88 | HR 80 | Temp 98.5°F | Ht 67.0 in | Wt 189.4 lb

## 2022-01-08 DIAGNOSIS — R7989 Other specified abnormal findings of blood chemistry: Secondary | ICD-10-CM

## 2022-01-08 DIAGNOSIS — F0280 Dementia in other diseases classified elsewhere without behavioral disturbance: Secondary | ICD-10-CM | POA: Diagnosis not present

## 2022-01-08 DIAGNOSIS — G309 Alzheimer's disease, unspecified: Secondary | ICD-10-CM

## 2022-01-08 DIAGNOSIS — N183 Chronic kidney disease, stage 3 unspecified: Secondary | ICD-10-CM

## 2022-01-08 MED ORDER — DONEPEZIL HCL 10 MG PO TABS: 10.0000 mg | ORAL_TABLET | Freq: Every day | ORAL | 3 refills | Status: DC

## 2022-01-08 MED ORDER — ATENOLOL 25 MG PO TABS: 25.0000 mg | ORAL_TABLET | Freq: Every day | ORAL | 3 refills | Status: DC

## 2022-01-08 NOTE — Assessment & Plan Note (Addendum)
Cont on Aricept, Namenda Starting to attend a Day care at [facility] 2 d/wk

## 2022-01-08 NOTE — Progress Notes (Signed)
Subjective:  Patient ID: Deborah Myers, female    DOB: [birth year]  Age: 86 y.o. MRN: [id number]  CC: Annual Exam   HPI Deborah Myers presents for wt gain - lost wt, dementia, HTN, CRI  She is here w/Joyce - her sister, h=she is helping w/hx Starting to attend a Day care at [facility] 2 d/wk  Outpatient Medications Prior to Visit  Medication Sig Dispense Refill   b complex vitamins tablet Take 1 tablet by mouth daily. 100 tablet 3   Cholecalciferol (VITAMIN D3) 50 MCG (2000 UT) capsule Take 1 capsule (2,000 Units total) by mouth daily. 100 capsule 3   memantine (NAMENDA) 10 MG tablet Take 1 tablet (10 mg total) by mouth 2 (two) times daily. 180 tablet 3   atenolol (TENORMIN) 25 MG tablet Take 1 tablet (25 mg total) by mouth daily. Enough until received mail order 14 tablet 0   donepezil (ARICEPT) 10 MG tablet Take 1 tablet (10 mg total) by mouth at bedtime. 90 tablet 2   No facility-administered medications prior to visit.    ROS: Review of Systems  Constitutional:  Negative for activity change, appetite change, chills, fatigue and unexpected weight change.  HENT:  Negative for congestion, mouth sores and sinus pressure.   Eyes:  Negative for visual disturbance.  Respiratory:  Negative for cough and chest tightness.   Gastrointestinal:  Negative for abdominal pain and nausea.  Genitourinary:  Negative for difficulty urinating, frequency and vaginal pain.  Musculoskeletal:  Negative for back pain and gait problem.  Skin:  Negative for pallor and rash.  Neurological:  Negative for dizziness, tremors, weakness, numbness and headaches.  Psychiatric/Behavioral:  Positive for confusion and decreased concentration. Negative for behavioral problems and sleep disturbance. The patient is nervous/anxious.    Objective:  BP 140/88    Pulse 80    Temp 98.5 F (36.9 C) (Oral)    Ht 5\' 7"  (1.702 m)    Wt 189 lb 6 oz (85.9 kg)    SpO2 96%    BMI 29.66 kg/m   BP Readings from Last  3 Encounters:  01/08/22 140/88  09/11/21 (!) 152/86  05/11/21 132/82    Wt Readings from Last 3 Encounters:  01/08/22 189 lb 6 oz (85.9 kg)  09/11/21 193 lb 9.6 oz (87.8 kg)  05/11/21 189 lb (85.7 kg)    Physical Exam Constitutional:      General: She is not in acute distress.    Appearance: She is well-developed. She is obese.  HENT:     Head: Normocephalic.     Right Ear: External ear normal.     Left Ear: External ear normal.     Nose: Nose normal.  Eyes:     General:        Right eye: No discharge.        Left eye: No discharge.     Conjunctiva/sclera: Conjunctivae normal.     Pupils: Pupils are equal, round, and reactive to light.  Neck:     Thyroid: No thyromegaly.     Vascular: No JVD.     Trachea: No tracheal deviation.  Cardiovascular:     Rate and Rhythm: Normal rate and regular rhythm.     Heart sounds: Normal heart sounds.  Pulmonary:     Effort: No respiratory distress.     Breath sounds: No stridor. No wheezing.  Abdominal:     General: Bowel sounds are normal. There is no distension.  Palpations: Abdomen is soft. There is no mass.     Tenderness: There is no abdominal tenderness. There is no guarding or rebound.  Musculoskeletal:        General: No tenderness.     Cervical back: Normal range of motion and neck supple. No rigidity.  Lymphadenopathy:     Cervical: No cervical adenopathy.  Skin:    Findings: No erythema or rash.  Neurological:     Mental Status: She is disoriented.     Cranial Nerves: No cranial nerve deficit.     Motor: No abnormal muscle tone.     Coordination: Coordination abnormal.     Gait: Gait abnormal.     Deep Tendon Reflexes: Reflexes normal.  Using a walker  Lab Results  Component Value Date   WBC 7.5 03/31/2020   HGB 14.0 03/31/2020   HCT 43.4 03/31/2020   PLT 292.0 03/31/2020   GLUCOSE 75 09/11/2021   CHOL 283 (H) 04/01/2018   TRIG 78.0 04/01/2018   HDL 61.60 04/01/2018   LDLCALC 206 (H) 04/01/2018   ALT  7 09/11/2021   AST 15 09/11/2021   NA 143 09/11/2021   K 3.5 09/11/2021   CL 106 09/11/2021   CREATININE 1.21 (H) 09/11/2021   BUN 22 09/11/2021   CO2 27 09/11/2021   TSH 9.13 (H) 09/11/2021   HGBA1C 5.5 01/04/2021    CT Head Wo Contrast  Result Date: 04/09/2018 CLINICAL DATA:  Altered level of consciousness EXAM: CT HEAD WITHOUT CONTRAST TECHNIQUE: Contiguous axial images were obtained from the base of the skull through the vertex without intravenous contrast. COMPARISON:  None. FINDINGS: Brain: No evidence of acute infarction, hemorrhage, extra-axial collection, ventriculomegaly, or mass effect. Generalized cerebral atrophy. Periventricular white matter low attenuation likely secondary to microangiopathy. Vascular: Cerebrovascular atherosclerotic calcifications are noted. Skull: Negative for fracture or focal lesion. Sinuses/Orbits: Visualized portions of the orbits are unremarkable. Visualized portions of the paranasal sinuses and mastoid air cells are unremarkable. Other: None. IMPRESSION: No acute intracranial pathology. Electronically Signed   By: Kathreen Devoid   On: 04/09/2018 14:57    Assessment & Plan:   Problem List Items Addressed This Visit     Abnormal TSH - Primary   Relevant Orders   Comprehensive metabolic panel   TSH   T4, free   Alzheimer disease ([place])    Cont on Aricept, Namenda      Relevant Medications   donepezil (ARICEPT) 10 MG tablet   CRF (chronic renal failure), stage 3 (moderate) (HCC)   Relevant Orders   Comprehensive metabolic panel   TSH   T4, free      Meds ordered this encounter  Medications   atenolol (TENORMIN) 25 MG tablet    Sig: Take 1 tablet (25 mg total) by mouth daily. Enough until received mail order    Dispense:  90 tablet    Refill:  3   donepezil (ARICEPT) 10 MG tablet    Sig: Take 1 tablet (10 mg total) by mouth at bedtime.    Dispense:  90 tablet    Refill:  3      Follow-up: Return in about 4 months (around 05/08/2022)  for a follow-up visit.  Walker Kehr, MD

## 2022-01-16 ENCOUNTER — Ambulatory Visit: Payer: Medicare HMO | Admitting: Internal Medicine

## 2022-01-18 ENCOUNTER — Ambulatory Visit (HOSPITAL_COMMUNITY)
Admission: EM | Admit: 2022-01-18 | Discharge: 2022-01-18 | Disposition: A | Discharge status: Home / Self Care | Payer: Medicare HMO | Attending: Family Medicine | Admitting: Family Medicine

## 2022-01-18 ENCOUNTER — Encounter (HOSPITAL_COMMUNITY): Payer: Self-pay

## 2022-01-18 ENCOUNTER — Other Ambulatory Visit: Payer: Self-pay

## 2022-01-18 DIAGNOSIS — I10 Essential (primary) hypertension: Secondary | ICD-10-CM

## 2022-01-18 DIAGNOSIS — N3001 Acute cystitis with hematuria: Secondary | ICD-10-CM | POA: Diagnosis not present

## 2022-01-18 LAB — BASIC METABOLIC PANEL
Anion gap: 8 (ref 5–15)
BUN: 18 mg/dL (ref 8–23)
CO2: 27 mmol/L (ref 22–32)
Calcium: 9.9 mg/dL (ref 8.9–10.3)
Chloride: 106 mmol/L (ref 98–111)
Creatinine, Ser: 1.1 mg/dL — ABNORMAL HIGH (ref 0.44–1.00)
GFR, Estimated: 50 mL/min — ABNORMAL LOW (ref >60)
Glucose, Bld: 80 mg/dL (ref 70–99)
Potassium: 4.2 mmol/L (ref 3.5–5.1)
Sodium: 141 mmol/L (ref 135–145)

## 2022-01-18 LAB — POCT URINALYSIS DIPSTICK, ED / UC
Bilirubin Urine: NEGATIVE
Glucose, UA: NEGATIVE mg/dL
Nitrite: POSITIVE — AB
Protein, ur: 100 mg/dL — AB
Specific Gravity, Urine: 1.025 (ref 1.005–1.030)
Urobilinogen, UA: 0.2 mg/dL (ref 0.0–1.0)
pH: 5.5 (ref 5.0–8.0)

## 2022-01-18 MED ORDER — CEPHALEXIN 500 MG PO CAPS: 500.0000 mg | ORAL_CAPSULE | Freq: Two times a day (BID) | ORAL | 0 refills | Status: DC

## 2022-01-18 MED ORDER — CEFTRIAXONE SODIUM 1 G IJ SOLR
INTRAMUSCULAR | Status: AC
  Filled 2022-01-18: qty 10

## 2022-01-18 MED ORDER — LIDOCAINE HCL (PF) 1 % IJ SOLN
INTRAMUSCULAR | Status: AC
  Filled 2022-01-18: qty 2

## 2022-01-18 MED ORDER — CEFTRIAXONE SODIUM 1 G IJ SOLR
1.0000 g | Freq: Once | INTRAMUSCULAR | Status: AC
  Administered 2022-01-18: 1 g via INTRAMUSCULAR

## 2022-01-18 MED ORDER — LOSARTAN POTASSIUM 25 MG PO TABS: 25.0000 mg | ORAL_TABLET | Freq: Every day | ORAL | 0 refills | Status: DC

## 2022-01-18 NOTE — ED Triage Notes (Signed)
Per sister/caregiver, pt was at [facility] and they said her blood pressure was high and needed to be seen. Pt denies any sx's. ?

## 2022-01-18 NOTE — ED Provider Notes (Signed)
[facility]    CSN: [id number] Arrival date & time: 01/18/22  1519      History   Chief Complaint Chief Complaint  Patient presents with   Hypertension    HPI Deborah Myers is a 86 y.o. female.   Patient presents today accompanied by her sister who provide the majority of history.  Reports that they were told to seek medical attention due to elevated blood pressure readings.  She recently slightly started an adult daycare plan and they took her blood pressure multiple times which ranged from 341-937 systolic and 90-240 diastolic.  Patient is prescribed atenolol and has been taking this as prescribed without missing doses.  She does report increased sodium consumption last night but denies additional dietary or medication changes.  She denies any current symptoms including chest pain, shortness of breath, headache, vision changes, dizziness, leg swelling.   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      Home Medications    Prior to Admission medications   Medication Sig Start Date End Date Taking? Authorizing Provider  cephALEXin (KEFLEX) 500 MG capsule Take 1 capsule (500 mg total) by mouth 2 (two) times daily. 01/18/22  Yes ,  K, PA-C  losartan (COZAAR) 25 MG tablet Take 1 tablet (25 mg total) by mouth daily. 01/18/22  Yes ,  K, PA-C  atenolol (TENORMIN) 25 MG tablet Take 1 tablet (25 mg total) by mouth daily. Enough until received mail order 01/08/22   Plotnikov, Evie Lacks, MD  b complex vitamins tablet Take 1 tablet by mouth daily. 10/22/18   Plotnikov, Evie Lacks, MD  Cholecalciferol (VITAMIN D3) 50 MCG (2000 UT) capsule Take 1 capsule (2,000 Units total) by mouth daily. 10/22/18   Plotnikov, Evie Lacks, MD  donepezil (ARICEPT) 10 MG tablet Take 1 tablet (10 mg total) by mouth at bedtime. 01/08/22   Plotnikov, Evie Lacks, MD  memantine (NAMENDA) 10 MG tablet Take 1 tablet (10 mg total) by mouth 2 (two) times daily. 11/29/21   Plotnikov, Evie Lacks, MD    Family History Family History  Problem Relation Age of Onset   Coronary artery disease Mother    Diabetes Mother    Heart disease Mother        CHF   Coronary artery disease Father    Diabetes Father    Heart disease Father  Diabetes Sister    Diabetes Brother    Cancer Neg Hx    COPD Neg Hx    Colon cancer Neg Hx    Esophageal cancer Neg Hx    Stomach cancer Neg Hx    Rectal cancer Neg Hx     Social History Social History   Tobacco Use   Smoking status: Never   Smokeless tobacco: Never  Substance Use Topics   Alcohol use: No    Alcohol/week: 4.0 standard drinks    Types: 4 Standard drinks or equivalent per week    Comment: 03/06/2016 states she does not drink ETOH    Drug use: No     Allergies   Tiazac [diltiazem]   Review of Systems Review of Systems  Constitutional:  Negative for activity change, appetite change, fatigue and fever.  Eyes:  Negative for photophobia and  visual disturbance.  Respiratory:  Negative for cough and shortness of breath.   Cardiovascular:  Negative for chest pain, palpitations and leg swelling.  Gastrointestinal:  Negative for abdominal pain, diarrhea, nausea and vomiting.  Neurological:  Negative for dizziness, weakness, light-headedness and headaches.    Physical Exam Triage Vital Signs ED Triage Vitals  Enc Vitals Group     BP 01/18/22 1554 (!) 251/89     Pulse Rate 01/18/22 1554 (!) 53     Resp 01/18/22 1554 18     Temp 01/18/22 1554 98.8 F (37.1 C)     Temp Source 01/18/22 1554 Oral     SpO2 01/18/22 1554 95 %     Weight --      Height --      Head Circumference --      Peak Flow --      Pain Score 01/18/22 1555 0     Pain Loc --      Pain Edu? --      Excl. in [place]? --    No data found.  Updated Vital Signs BP (!) 220/86 (BP Location: Left Arm)    Pulse (!) 53    Temp 98.8 F (37.1 C) (Oral)    Resp 18    SpO2 99%   Visual Acuity Right Eye Distance:   Left Eye Distance:   Bilateral Distance:    Right Eye Near:   Left Eye Near:    Bilateral Near:     Physical Exam Vitals reviewed.  Constitutional:      General: She is awake. She is not in acute distress.    Appearance: Normal appearance. She is well-developed. She is not ill-appearing.     Comments: Very pleasant female appears stated age no acute distress sitting on rollator  HENT:     Head: Normocephalic and atraumatic.  Eyes:     Extraocular Movements: Extraocular movements intact.     Pupils: Pupils are equal, round, and reactive to light.     Funduscopic exam:    Right eye: No hemorrhage.        Left eye: No hemorrhage.     Comments: Funduscopic exam limited by constricted pupils  Cardiovascular:     Rate and Rhythm: Regular rhythm. Bradycardia present.     Heart sounds: Normal heart sounds, S1 normal and S2 normal. No murmur heard. Pulmonary:     Effort: Pulmonary effort is normal.     Breath sounds: Normal breath sounds. No wheezing,  rhonchi or rales.     Comments: Clear to auscultation bilaterally Abdominal:     Palpations: Abdomen is soft.  Tenderness: There is no abdominal tenderness.  Psychiatric:        Behavior: Behavior is cooperative.     UC Treatments / Results  Labs (all labs ordered are listed, but only abnormal results are displayed) Labs Reviewed  BASIC METABOLIC PANEL - Abnormal; Notable for the following components:      Result Value   Creatinine, Ser 1.10 (*)    GFR, Estimated 50 (*)    All other components within normal limits  POCT URINALYSIS DIPSTICK, ED / UC - Abnormal; Notable for the following components:   Ketones, ur TRACE (*)    Hgb urine dipstick TRACE (*)    Protein, ur 100 (*)    Nitrite POSITIVE (*)    Leukocytes,Ua SMALL (*)    All other components within normal limits  URINE CULTURE    EKG   Radiology No results found.  Procedures Procedures (including critical care time)  Medications Ordered in UC Medications  cefTRIAXone (ROCEPHIN) injection 1 g (1 g Intramuscular Given 01/18/22 2002)    Initial Impression / Assessment and Plan / UC Course  I have reviewed the triage vital signs and the nursing notes.  Pertinent labs & imaging results that were available during my care of the patient were reviewed by me and considered in my medical decision making (see chart for details).  Clinical Course as of 01/18/22 2008  Thu Jan 18, 2022  0160 Basic metabolic panel [BH]    Clinical Course User Index [BH] Vanessa Kick, MD    Unclear etiology of symptoms.  10 days ago patient was seen and had normal blood pressure and has not had hypertensive urgency or emergency in the past.  Reports she is taking medication as prescribed.  She denies any signs/symptoms of endorgan damage.  EKG was obtained which showed sinus bradycardia with nonspecific ST changes in aVF compared to 10/05/2012 tracing without ischemic changes.  Patient is unable to take calcium channel blockers due to  history of leg edema.  Her last metabolic panel showed slightly elevated creatinine from baseline in October 2022 and has not been rechecked since that time.  BMP was obtained STAT in clinic which showed 1.1 and calculated creatinine clearance of 52.44 mL/min.  Given she is asymptomatic with no evidence of endorgan damage on laboratory and EKG we will start losartan 25 mg to manage hypertension.  She is to continue her atenolol as previously prescribed.  Discussed that we will need to repeat her lab work in a few weeks with new medication to monitor for hyperkalemia as well as kidney function.  She was given 1 g of Rocephin in clinic given UTI on exam.  She was started on Keflex.  Urine culture was obtained and discussed potential need to change antibiotics based on susceptibilities identified on culture.  She is to rest and drink plenty of fluid.  Discussed that she should be reevaluated within the next few days ideally with her PCP.  If she is unable to see her PCP by Monday she is to return here for reevaluation.  If anything worsens and she develops chest pain, shortness of breath, high fever, nausea, vomiting, dizziness she needs to go to the emergency room.  Strict return precautions given to which patient and sister expressed understanding.   Final Clinical Impressions(s) / UC Diagnoses   Final diagnoses:  Acute cystitis with hematuria  Elevated blood pressure reading with diagnosis of hypertension     Discharge Instructions      Your blood  pressure is very elevated today.  Please start losartan 25 mg.  This will need to be rechecked within the next few days.  Please follow-up with your primary care as soon as possible.  If you are unable to see them by Monday please return here for reevaluation.  It looks like you have a urinary tract infection.  Start antibiotics as prescribed.  Make sure you are drinking plenty of fluid.  If you have any worsening symptoms including high fever, chest pain,  shortness of breath, nausea, vomiting you need to go to the emergency room immediately.     ED Prescriptions     Medication Sig Dispense Auth. Provider   losartan (COZAAR) 25 MG tablet Take 1 tablet (25 mg total) by mouth daily. 14 tablet ,  K, PA-C   cephALEXin (KEFLEX) 500 MG capsule Take 1 capsule (500 mg total) by mouth 2 (two) times daily. 20 capsule , Derry Skill, PA-C      PDMP not reviewed this encounter.   Terrilee Croak, PA-C 01/18/22 2008

## 2022-01-18 NOTE — Discharge Instructions (Signed)
Your blood pressure is very elevated today.  Please start losartan 25 mg.  This will need to be rechecked within the next few days.  Please follow-up with your primary care as soon as possible.  If you are unable to see them by Monday please return here for reevaluation.  It looks like you have a urinary tract infection.  Start antibiotics as prescribed.  Make sure you are drinking plenty of fluid.  If you have any worsening symptoms including high fever, chest pain, shortness of breath, nausea, vomiting you need to go to the emergency room immediately. ?

## 2022-01-21 LAB — URINE CULTURE: Culture: >=100000 — AB

## 2022-01-29 ENCOUNTER — Ambulatory Visit (INDEPENDENT_AMBULATORY_CARE_PROVIDER_SITE_OTHER): Payer: Medicare HMO | Admitting: Internal Medicine

## 2022-01-29 ENCOUNTER — Other Ambulatory Visit: Payer: Self-pay

## 2022-01-29 ENCOUNTER — Encounter: Payer: Self-pay | Admitting: Internal Medicine

## 2022-01-29 DIAGNOSIS — N183 Chronic kidney disease, stage 3 unspecified: Secondary | ICD-10-CM | POA: Diagnosis not present

## 2022-01-29 DIAGNOSIS — F0280 Dementia in other diseases classified elsewhere without behavioral disturbance: Secondary | ICD-10-CM

## 2022-01-29 DIAGNOSIS — I10 Essential (primary) hypertension: Secondary | ICD-10-CM

## 2022-01-29 DIAGNOSIS — G309 Alzheimer's disease, unspecified: Secondary | ICD-10-CM

## 2022-01-29 MED ORDER — AMLODIPINE BESYLATE-VALSARTAN 5-160 MG PO TABS: 1.0000 | ORAL_TABLET | Freq: Every day | ORAL | 3 refills | Status: DC

## 2022-01-29 MED ORDER — AMLODIPINE BESYLATE-VALSARTAN 5-160 MG PO TABS: 1.0000 | ORAL_TABLET | Freq: Every day | ORAL | 1 refills | Status: DC

## 2022-01-29 NOTE — Assessment & Plan Note (Signed)
Cont on Aricept, Namenda ?

## 2022-01-29 NOTE — Progress Notes (Signed)
? ?Subjective:  ?Patient ID: Deborah Myers, female    DOB: [birth year]  Age: 86 y.o. MRN: [id number] ? ?CC: No chief complaint on file. ? ? ?HPI ?Deborah Myers presents for UTI and HTN. ?BP was 226/104 at [facility] on 01/23/22. Pt went to UC ?ER notes reviewed ?Sister [name] helps w/hx ?Outpatient Medications Prior to Visit  ?Medication Sig Dispense Refill  ? atenolol (TENORMIN) 25 MG tablet Take 1 tablet (25 mg total) by mouth daily. Enough until received mail order 90 tablet 3  ? b complex vitamins tablet Take 1 tablet by mouth daily. 100 tablet 3  ? Cholecalciferol (VITAMIN D3) 50 MCG (2000 UT) capsule Take 1 capsule (2,000 Units total) by mouth daily. 100 capsule 3  ? donepezil (ARICEPT) 10 MG tablet Take 1 tablet (10 mg total) by mouth at bedtime. 90 tablet 3  ? memantine (NAMENDA) 10 MG tablet Take 1 tablet (10 mg total) by mouth 2 (two) times daily. 180 tablet 3  ? cephALEXin (KEFLEX) 500 MG capsule Take 1 capsule (500 mg total) by mouth 2 (two) times daily. 20 capsule 0  ? losartan (COZAAR) 25 MG tablet Take 1 tablet (25 mg total) by mouth daily. 14 tablet 0  ? ?No facility-administered medications prior to visit.  ? ? ?ROS: ?Review of Systems  ?Constitutional:  Positive for fatigue. Negative for activity change, appetite change, chills and unexpected weight change.  ?HENT:  Negative for congestion, mouth sores and sinus pressure.   ?Eyes:  Negative for visual disturbance.  ?Respiratory:  Negative for cough and chest tightness.   ?Gastrointestinal:  Negative for abdominal pain and nausea.  ?Genitourinary:  Negative for difficulty urinating, frequency and vaginal pain.  ?Musculoskeletal:  Positive for arthralgias and gait problem. Negative for back pain.  ?Skin:  Negative for pallor and rash.  ?Neurological:  Negative for dizziness, tremors, weakness, numbness and headaches.  ?Psychiatric/Behavioral:  Positive for confusion and decreased concentration. Negative for hallucinations, sleep disturbance and  suicidal ideas.   ? ?Objective:  ?BP (!) 180/100 (BP Location: Right Arm, Patient Position: Sitting, Cuff Size: Large)   Pulse (!) 57   Temp 98.7 ?F (37.1 ?C) (Oral)   Ht '5\' 7"'$  (1.702 m)   Wt 191 lb (86.6 kg)   SpO2 97%   BMI 29.91 kg/m?  ? ?BP Readings from Last 3 Encounters:  ?01/29/22 (!) 180/100  ?01/18/22 (!) 220/86  ?01/08/22 140/88  ? ? ?Wt Readings from Last 3 Encounters:  ?01/29/22 191 lb (86.6 kg)  ?01/08/22 189 lb 6 oz (85.9 kg)  ?09/11/21 193 lb 9.6 oz (87.8 kg)  ? ? ?Physical Exam ?Constitutional:   ?   General: She is not in acute distress. ?   Appearance: She is well-developed. She is obese.  ?HENT:  ?   Head: Normocephalic.  ?   Right Ear: External ear normal.  ?   Left Ear: External ear normal.  ?   Nose: Nose normal.  ?Eyes:  ?   General:     ?   Right eye: No discharge.     ?   Left eye: No discharge.  ?   Conjunctiva/sclera: Conjunctivae normal.  ?   Pupils: Pupils are equal, round, and reactive to light.  ?Neck:  ?   Thyroid: No thyromegaly.  ?   Vascular: No JVD.  ?   Trachea: No tracheal deviation.  ?Cardiovascular:  ?   Rate and Rhythm: Normal rate and regular rhythm.  ?   Heart sounds: Normal  heart sounds.  ?Pulmonary:  ?   Effort: No respiratory distress.  ?   Breath sounds: No stridor. No wheezing.  ?Abdominal:  ?   General: Bowel sounds are normal. There is no distension.  ?   Palpations: Abdomen is soft. There is no mass.  ?   Tenderness: There is no abdominal tenderness. There is no guarding or rebound.  ?Musculoskeletal:     ?   General: No tenderness.  ?   Cervical back: Normal range of motion and neck supple. No rigidity.  ?   Right lower leg: No edema.  ?   Left lower leg: No edema.  ?Lymphadenopathy:  ?   Cervical: No cervical adenopathy.  ?Skin: ?   Findings: No erythema or rash.  ?Neurological:  ?   Mental Status: She is oriented to person, place, and time.  ?   Cranial Nerves: No cranial nerve deficit.  ?   Motor: No abnormal muscle tone.  ?   Coordination: Coordination  abnormal.  ?   Gait: Gait abnormal.  ?   Deep Tendon Reflexes: Reflexes normal.  ?Psychiatric:     ?   Behavior: Behavior normal.     ?   Thought Content: Thought content normal.  ?Using a walker ? ?Lab Results  ?Component Value Date  ? WBC 7.5 03/31/2020  ? HGB 14.0 03/31/2020  ? HCT 43.4 03/31/2020  ? PLT 292.0 03/31/2020  ? GLUCOSE 80 01/18/2022  ? CHOL 283 (H) 04/01/2018  ? TRIG 78.0 04/01/2018  ? HDL 61.60 04/01/2018  ? LDLCALC 206 (H) 04/01/2018  ? ALT 7 09/11/2021  ? AST 15 09/11/2021  ? NA 141 01/18/2022  ? K 4.2 01/18/2022  ? CL 106 01/18/2022  ? CREATININE 1.10 (H) 01/18/2022  ? BUN 18 01/18/2022  ? CO2 27 01/18/2022  ? TSH 9.13 (H) 09/11/2021  ? HGBA1C 5.5 01/04/2021  ? ? ?No results found. ? ?Assessment & Plan:  ? ?Problem List Items Addressed This Visit   ? ? Alzheimer disease ([place])  ?  Cont on Aricept, Namenda ?  ?  ? CRF (chronic renal failure), stage 3 (moderate) (HCC)  ?  Hydrate well ?  ?  ? Essential hypertension  ?  Worse ?D/c Losartan ?Start Azor 5-160 qd ?Continue Ateenolol ?D/c Losartan ?NAS diet ?  ?  ? Relevant Medications  ? amLODipine-valsartan (EXFORGE) 5-160 MG tablet  ?  ? ? ?Meds ordered this encounter  ?Medications  ? DISCONTD: amLODipine-valsartan (EXFORGE) 5-160 MG tablet  ?  Sig: Take 1 tablet by mouth daily.  ?  Dispense:  90 tablet  ?  Refill:  3  ? amLODipine-valsartan (EXFORGE) 5-160 MG tablet  ?  Sig: Take 1 tablet by mouth daily.  ?  Dispense:  14 tablet  ?  Refill:  1  ?  ? ? ?Follow-up: Return in about 4 weeks (around 02/26/2022) for a follow-up visit. ? ?Walker Kehr, MD ?

## 2022-01-29 NOTE — Assessment & Plan Note (Signed)
Worse ?D/c Losartan ?Start Azor 5-160 qd ?Continue Ateenolol ?D/c Losartan ?NAS diet ?

## 2022-01-29 NOTE — Assessment & Plan Note (Signed)
Hydrate well 

## 2022-01-30 ENCOUNTER — Ambulatory Visit: Payer: Medicare HMO | Admitting: Podiatry

## 2022-01-30 DIAGNOSIS — M79674 Pain in right toe(s): Secondary | ICD-10-CM

## 2022-01-30 DIAGNOSIS — B353 Tinea pedis: Secondary | ICD-10-CM | POA: Diagnosis not present

## 2022-01-30 DIAGNOSIS — M79675 Pain in left toe(s): Secondary | ICD-10-CM | POA: Diagnosis not present

## 2022-01-30 DIAGNOSIS — B351 Tinea unguium: Secondary | ICD-10-CM

## 2022-01-30 MED ORDER — CLOTRIMAZOLE 1 % EX CREA
TOPICAL_CREAM | CUTANEOUS | 1 refills | Status: DC
Start: 2022-01-30 — End: 2024-08-01

## 2022-02-05 ENCOUNTER — Encounter: Payer: Self-pay | Admitting: Podiatry

## 2022-02-05 NOTE — Progress Notes (Signed)
?  Subjective:  ?Patient ID: [name], female    DOB: 1936-04-11,  MRN: [id number] ? ?Coleta Grosshans presents to clinic today for painful thick toenails that are difficult to trim. Pain interferes with ambulation. Aggravating factors include wearing enclosed shoe gear. Pain is relieved with periodic professional debridement. ? ?New problem(s): None.  ? ?PCP is Plotnikov, Evie Lacks, MD , and last visit was January 29, 2022. ? ?Allergies  ?Allergen Reactions  ? Tiazac [Diltiazem]   ?  edema  ? ? ?Review of Systems: Negative except as noted in the HPI. ? ?Objective: ?Constitutional Lakechia Nay is a pleasant 86 y.o. African American female, WD, WN in NAD. AAO x 3.   ?Vascular Capillary refill time to digits immediate b/l. Palpable DP pulse(s) right lower extremity Faintly palpable DP pulse(s) left lower extremity. Faintly palpable PT pulse(s) b/l LE. Pedal hair present. No pain with calf compression b/l. Nonpitting edema noted BLE.  ?Neurologic Normal speech. Oriented to person, place, and time. Protective sensation intact 5/5 intact bilaterally with 10g monofilament b/l.  ?Dermatologic Pedal integument with normal turgor, texture and tone b/l LE. No open wounds b/l. No interdigital macerations b/l. Toenails 1-5 b/l elongated, thickened, discolored with subungual debris. +Tenderness with dorsal palpation of nailplates. No hyperkeratotic or porokeratotic lesions present. Diffuse scaling noted on dorsal aspect of digits with mild foot odor.  No interdigital macerations.  No blisters, no weeping. No signs of secondary bacterial infection noted.   ?Orthopedic: Normal muscle strength 5/5 to all lower extremity muscle groups bilaterally. HAV with bunion deformity noted b/l LE. Hammertoe(s) noted to the bilateral 2nd toes.. No pain, crepitus or joint limitation noted with ROM b/l LE.  Patient ambulates independently without assistive aids.  ? ?Radiographs: None ?Assessment/Plan: ?1. Pain due to onychomycosis  of toenails of both feet   ?2. Tinea pedis of both feet   ?-Examined patient. ?-Toenails 1-5 b/l were debrided in length and girth with sterile nail nippers and dremel without iatrogenic bleeding.  ?-For tinea pedis, Rx sent to pharmacy for Clotrimazole Cream 1% to be applied twice daily for six weeks. ?-Patient/POA to call should there be question/concern in the interim.  ? ?Return in about 3 months (around 05/02/2022). ? ?Marzetta Board, DPM  ?

## 2022-02-19 ENCOUNTER — Telehealth: Payer: Self-pay | Admitting: Internal Medicine

## 2022-02-19 NOTE — Telephone Encounter (Signed)
Pts sister states pt is out of memantine (NAMENDA) 10 MG tablet and pharmacy has informed her it is too early for a refill ? ?Caller inquiring "what should she do in the mean time" ? ?Caller requesting a cb ? ?Phone [phone]- [name] ? ?

## 2022-02-22 MED ORDER — MEMANTINE HCL 10 MG PO TABS: 10.0000 mg | ORAL_TABLET | Freq: Two times a day (BID) | ORAL | 3 refills | Status: DC

## 2022-02-22 MED ORDER — MEMANTINE HCL 10 MG PO TABS
10.0000 mg | ORAL_TABLET | Freq: Two times a day (BID) | ORAL | 3 refills | Status: DC
Start: 2022-02-22 — End: 2022-02-22

## 2022-02-22 NOTE — Telephone Encounter (Signed)
Okay to fill early.  Do the patient take more Namenda than she should? ?I thought that [name] keeps [facility] medicines and gives them to her daily. ?Thx ?

## 2022-02-22 NOTE — Telephone Encounter (Signed)
Pt's sister came into the office to request that Namenda be sent to [facility]. Refill was sent to [facility] ?

## 2022-02-27 NOTE — Telephone Encounter (Signed)
Called sister Deborah Myers) [facility] pt assistance for namenda is ready for pick-up.[name]/l,mb ?

## 2022-03-05 ENCOUNTER — Ambulatory Visit (INDEPENDENT_AMBULATORY_CARE_PROVIDER_SITE_OTHER): Payer: Medicare HMO | Admitting: Internal Medicine

## 2022-03-05 ENCOUNTER — Encounter: Payer: Self-pay | Admitting: Internal Medicine

## 2022-03-05 DIAGNOSIS — R946 Abnormal results of thyroid function studies: Secondary | ICD-10-CM

## 2022-03-05 DIAGNOSIS — F0280 Dementia in other diseases classified elsewhere without behavioral disturbance: Secondary | ICD-10-CM

## 2022-03-05 DIAGNOSIS — G309 Alzheimer's disease, unspecified: Secondary | ICD-10-CM | POA: Diagnosis not present

## 2022-03-05 DIAGNOSIS — S8002XA Contusion of left knee, initial encounter: Secondary | ICD-10-CM | POA: Diagnosis not present

## 2022-03-05 DIAGNOSIS — R7989 Other specified abnormal findings of blood chemistry: Secondary | ICD-10-CM | POA: Diagnosis not present

## 2022-03-05 DIAGNOSIS — I10 Essential (primary) hypertension: Secondary | ICD-10-CM | POA: Diagnosis not present

## 2022-03-05 DIAGNOSIS — N183 Chronic kidney disease, stage 3 unspecified: Secondary | ICD-10-CM | POA: Diagnosis not present

## 2022-03-05 DIAGNOSIS — S8000XA Contusion of unspecified knee, initial encounter: Secondary | ICD-10-CM | POA: Insufficient documentation

## 2022-03-05 LAB — COMPREHENSIVE METABOLIC PANEL
ALT: 8 U/L (ref 0–35)
AST: 15 U/L (ref 0–37)
Albumin: 4 g/dL (ref 3.5–5.2)
Alkaline Phosphatase: 71 U/L (ref 39–117)
BUN: 21 mg/dL (ref 6–23)
CO2: 28 mEq/L (ref 19–32)
Calcium: 10.4 mg/dL (ref 8.4–10.5)
Chloride: 105 mEq/L (ref 96–112)
Creatinine, Ser: 1.22 mg/dL — ABNORMAL HIGH (ref 0.40–1.20)
GFR: 40.88 mL/min — ABNORMAL LOW (ref >60.00)
Glucose, Bld: 107 mg/dL — ABNORMAL HIGH (ref 70–99)
Potassium: 3.9 mEq/L (ref 3.5–5.1)
Sodium: 142 mEq/L (ref 135–145)
Total Bilirubin: 0.5 mg/dL (ref 0.2–1.2)
Total Protein: 8.2 g/dL (ref 6.0–8.3)

## 2022-03-05 LAB — TSH: TSH: 4.07 u[IU]/mL (ref 0.35–5.50)

## 2022-03-05 LAB — T4, FREE: Free T4: 0.87 ng/dL (ref 0.60–1.60)

## 2022-03-05 NOTE — Progress Notes (Signed)
? ?Subjective:  ?Patient ID: Deborah Myers, female    DOB: [birth year]  Age: 86 y.o. MRN: [id number] ? ?CC: No chief complaint on file. ? ? ?HPI ?Deborah Myers presents for HTN, dementia ?C/o fell out of bed - c/o L knee pain. No other injuries ?Here w/sister [name] - helps w/hx ? ?Outpatient Medications Prior to Visit  ?Medication Sig Dispense Refill  ? amLODipine-valsartan (EXFORGE) 5-160 MG tablet Take 1 tablet by mouth daily. 14 tablet 1  ? atenolol (TENORMIN) 25 MG tablet Take 1 tablet (25 mg total) by mouth daily. Enough until received mail order 90 tablet 3  ? b complex vitamins tablet Take 1 tablet by mouth daily. 100 tablet 3  ? Cholecalciferol (VITAMIN D3) 50 MCG (2000 UT) capsule Take 1 capsule (2,000 Units total) by mouth daily. 100 capsule 3  ? clotrimazole (LOTRIMIN) 1 % cream Apply to both feet and between toes twice daily 60 g 1  ? donepezil (ARICEPT) 10 MG tablet Take 1 tablet (10 mg total) by mouth at bedtime. 90 tablet 3  ? memantine (NAMENDA) 10 MG tablet Take 1 tablet (10 mg total) by mouth 2 (two) times daily. 180 tablet 3  ? ?No facility-administered medications prior to visit.  ? ? ?ROS: ?Review of Systems  ?Constitutional:  Negative for activity change, appetite change, chills, fatigue and unexpected weight change.  ?HENT:  Negative for congestion, mouth sores and sinus pressure.   ?Eyes:  Negative for visual disturbance.  ?Respiratory:  Negative for cough and chest tightness.   ?Gastrointestinal:  Negative for abdominal pain and nausea.  ?Genitourinary:  Negative for difficulty urinating, frequency and vaginal pain.  ?Musculoskeletal:  Positive for arthralgias. Negative for back pain and gait problem.  ?Skin:  Negative for pallor and rash.  ?Neurological:  Negative for dizziness, tremors, weakness, numbness and headaches.  ?Psychiatric/Behavioral:  Positive for confusion, decreased concentration and sleep disturbance.   ? ?Objective:  ?BP 140/88 (BP Location: Left Arm, Patient  Position: Sitting, Cuff Size: Large)   Pulse 75   Temp 98.4 ?F (36.9 ?C) (Oral)   Ht '5\' 7"'$  (1.702 m)   Wt 187 lb (84.8 kg)   SpO2 99%   BMI 29.29 kg/m?  ? ?BP Readings from Last 3 Encounters:  ?03/05/22 140/88  ?01/29/22 (!) 180/100  ?01/18/22 (!) 220/86  ? ? ?Wt Readings from Last 3 Encounters:  ?03/05/22 187 lb (84.8 kg)  ?01/29/22 191 lb (86.6 kg)  ?01/08/22 189 lb 6 oz (85.9 kg)  ? ? ?Physical Exam ?Constitutional:   ?   General: She is not in acute distress. ?   Appearance: She is well-developed. She is obese.  ?HENT:  ?   Head: Normocephalic.  ?   Right Ear: External ear normal.  ?   Left Ear: External ear normal.  ?   Nose: Nose normal.  ?Eyes:  ?   General:     ?   Right eye: No discharge.     ?   Left eye: No discharge.  ?   Conjunctiva/sclera: Conjunctivae normal.  ?   Pupils: Pupils are equal, round, and reactive to light.  ?Neck:  ?   Thyroid: No thyromegaly.  ?   Vascular: No JVD.  ?   Trachea: No tracheal deviation.  ?Cardiovascular:  ?   Rate and Rhythm: Normal rate and regular rhythm.  ?   Heart sounds: Normal heart sounds.  ?Pulmonary:  ?   Effort: No respiratory distress.  ?   Breath  sounds: No stridor. No wheezing.  ?Abdominal:  ?   General: Bowel sounds are normal. There is no distension.  ?   Palpations: Abdomen is soft. There is no mass.  ?   Tenderness: There is no abdominal tenderness. There is no guarding or rebound.  ?Musculoskeletal:     ?   General: Tenderness present.  ?   Cervical back: Normal range of motion and neck supple. No rigidity.  ?Lymphadenopathy:  ?   Cervical: No cervical adenopathy.  ?Skin: ?   Findings: No erythema or rash.  ?Neurological:  ?   Mental Status: She is disoriented.  ?   Cranial Nerves: No cranial nerve deficit.  ?   Motor: No abnormal muscle tone.  ?   Coordination: Coordination abnormal.  ?   Gait: Gait abnormal.  ?   Deep Tendon Reflexes: Reflexes normal.  ?Psychiatric:     ?   Behavior: Behavior normal.     ?   Thought Content: Thought content  normal.     ?   Judgment: Judgment normal.  ? ? ?Lab Results  ?Component Value Date  ? WBC 7.5 03/31/2020  ? HGB 14.0 03/31/2020  ? HCT 43.4 03/31/2020  ? PLT 292.0 03/31/2020  ? GLUCOSE 80 01/18/2022  ? CHOL 283 (H) 04/01/2018  ? TRIG 78.0 04/01/2018  ? HDL 61.60 04/01/2018  ? LDLCALC 206 (H) 04/01/2018  ? ALT 7 09/11/2021  ? AST 15 09/11/2021  ? NA 141 01/18/2022  ? K 4.2 01/18/2022  ? CL 106 01/18/2022  ? CREATININE 1.10 (H) 01/18/2022  ? BUN 18 01/18/2022  ? CO2 27 01/18/2022  ? TSH 9.13 (H) 09/11/2021  ? HGBA1C 5.5 01/04/2021  ? ? ?No results found. ? ?Assessment & Plan:  ? ?Problem List Items Addressed This Visit   ? ? Essential hypertension  ?  Better ?Continue Atenolol,  Azor 5-160 qd ?  ?  ? Alzheimer disease ([place])  ?  Cont on Aricept, Namenda ?  ?  ? Knee contusion  ?  New -L knee ?Blue-Emu cream was recommended to use 2-3 times a day ? ?  ?  ?  ? ? ?No orders of the defined types were placed in this encounter. ?  ? ? ?Follow-up: No follow-ups on file. ? ?Walker Kehr, MD ?

## 2022-03-05 NOTE — Assessment & Plan Note (Signed)
Better ?Continue Atenolol,  Azor 5-160 qd ?

## 2022-03-05 NOTE — Assessment & Plan Note (Signed)
New -L knee ?Blue-Emu cream was recommended to use 2-3 times a day ?

## 2022-03-05 NOTE — Assessment & Plan Note (Signed)
Cont on Aricept, Namenda ?

## 2022-04-04 ENCOUNTER — Ambulatory Visit: Payer: Medicare HMO

## 2022-04-20 ENCOUNTER — Telehealth: Payer: Self-pay | Admitting: Internal Medicine

## 2022-04-20 NOTE — Telephone Encounter (Signed)
Pt sister has stated pt is nibbling and not drinking as much. Pt also is not taking her medication and pt has last taken her meds since Wednesday.  Pt sister will try to get pt to take meds this evening and see if she will take them.  **Pt sister just wanted to inform provider.

## 2022-04-21 NOTE — Telephone Encounter (Signed)
Noted! Thank you

## 2022-05-07 ENCOUNTER — Encounter: Payer: Self-pay | Admitting: Internal Medicine

## 2022-05-07 ENCOUNTER — Ambulatory Visit (INDEPENDENT_AMBULATORY_CARE_PROVIDER_SITE_OTHER): Payer: Medicare HMO | Admitting: Internal Medicine

## 2022-05-07 DIAGNOSIS — F0280 Dementia in other diseases classified elsewhere without behavioral disturbance: Secondary | ICD-10-CM

## 2022-05-07 DIAGNOSIS — G309 Alzheimer's disease, unspecified: Secondary | ICD-10-CM | POA: Diagnosis not present

## 2022-05-07 DIAGNOSIS — I10 Essential (primary) hypertension: Secondary | ICD-10-CM | POA: Diagnosis not present

## 2022-05-07 DIAGNOSIS — N183 Chronic kidney disease, stage 3 unspecified: Secondary | ICD-10-CM | POA: Diagnosis not present

## 2022-05-07 LAB — COMPREHENSIVE METABOLIC PANEL
ALT: 8 U/L (ref 0–35)
AST: 15 U/L (ref 0–37)
Albumin: 4.1 g/dL (ref 3.5–5.2)
Alkaline Phosphatase: 71 U/L (ref 39–117)
BUN: 22 mg/dL (ref 6–23)
CO2: 29 mEq/L (ref 19–32)
Calcium: 10.3 mg/dL (ref 8.4–10.5)
Chloride: 105 mEq/L (ref 96–112)
Creatinine, Ser: 1.38 mg/dL — ABNORMAL HIGH (ref 0.40–1.20)
GFR: 35.22 mL/min — ABNORMAL LOW (ref >60.00)
Glucose, Bld: 80 mg/dL (ref 70–99)
Potassium: 3.4 mEq/L — ABNORMAL LOW (ref 3.5–5.1)
Sodium: 141 mEq/L (ref 135–145)
Total Bilirubin: 0.6 mg/dL (ref 0.2–1.2)
Total Protein: 8.4 g/dL — ABNORMAL HIGH (ref 6.0–8.3)

## 2022-05-07 MED ORDER — METHYLPHENIDATE HCL 5 MG PO TABS: 5.0000 mg | ORAL_TABLET | Freq: Every morning | ORAL | 0 refills | Status: DC

## 2022-05-07 NOTE — Assessment & Plan Note (Signed)
Cont to monitor GFR Hydrate well

## 2022-05-07 NOTE — Assessment & Plan Note (Signed)
Continue Atenolol,  Azor 5-160 qd

## 2022-05-07 NOTE — Assessment & Plan Note (Signed)
Worse. Progressing disease, apathy. Deborah Myers is c/o [place] staying in bed all the time and refusing to go out, do things. Deborah Myers will speak to a Education officer, museum w/[employer] re: placement Options to help [place] were discussed, ie Ritalin at low dose.

## 2022-05-07 NOTE — Progress Notes (Signed)
Subjective:  Patient ID: Deborah Myers, female    DOB: Dec 21, 1935  Age: 86 y.o. MRN: [id number]  CC: No chief complaint on file.   HPI [name] presents for dementia. [name] is c/o [place] staying in bed all the time and refusing to go out, do things. F/u HTN, CRI  Outpatient Medications Prior to Visit  Medication Sig Dispense Refill   amLODipine-valsartan (EXFORGE) 5-160 MG tablet Take 1 tablet by mouth daily. 14 tablet 1   atenolol (TENORMIN) 25 MG tablet Take 1 tablet (25 mg total) by mouth daily. Enough until received mail order 90 tablet 3   b complex vitamins tablet Take 1 tablet by mouth daily. 100 tablet 3   Cholecalciferol (VITAMIN D3) 50 MCG (2000 UT) capsule Take 1 capsule (2,000 Units total) by mouth daily. 100 capsule 3   clotrimazole (LOTRIMIN) 1 % cream Apply to both feet and between toes twice daily 60 g 1   donepezil (ARICEPT) 10 MG tablet Take 1 tablet (10 mg total) by mouth at bedtime. 90 tablet 3   memantine (NAMENDA) 10 MG tablet Take 1 tablet (10 mg total) by mouth 2 (two) times daily. 180 tablet 3   No facility-administered medications prior to visit.    ROS: Review of Systems  Constitutional:  Positive for activity change, fatigue and unexpected weight change. Negative for appetite change and chills.  HENT:  Negative for congestion, mouth sores and sinus pressure.   Eyes:  Negative for visual disturbance.  Respiratory:  Negative for cough and chest tightness.   Gastrointestinal:  Negative for abdominal pain and nausea.  Genitourinary:  Negative for difficulty urinating, frequency and vaginal pain.  Musculoskeletal:  Positive for gait problem. Negative for back pain.  Skin:  Negative for pallor and rash.  Neurological:  Negative for dizziness, tremors, weakness, numbness and headaches.  Hematological:  Does not bruise/bleed easily.  Psychiatric/Behavioral:  Positive for behavioral problems, confusion and decreased concentration. Negative for  sleep disturbance and suicidal ideas. The patient is not nervous/anxious.     Objective:  BP 122/80 (BP Location: Left Arm, Patient Position: Sitting, Cuff Size: Normal)   Pulse (!) 57   Temp 97.9 F (36.6 C) (Oral)   Ht '5\' 7"'$  (1.702 m)   Wt 180 lb (81.6 kg)   SpO2 98%   BMI 28.19 kg/m   BP Readings from Last 3 Encounters:  05/07/22 122/80  03/05/22 140/88  01/29/22 (!) 180/100    Wt Readings from Last 3 Encounters:  05/07/22 180 lb (81.6 kg)  03/05/22 187 lb (84.8 kg)  01/29/22 191 lb (86.6 kg)    Physical Exam Constitutional:      General: She is not in acute distress.    Appearance: She is well-developed. She is obese.  HENT:     Head: Normocephalic.     Right Ear: External ear normal.     Left Ear: External ear normal.     Nose: Nose normal.  Eyes:     General:        Right eye: No discharge.        Left eye: No discharge.     Conjunctiva/sclera: Conjunctivae normal.     Pupils: Pupils are equal, round, and reactive to light.  Neck:     Thyroid: No thyromegaly.     Vascular: No JVD.     Trachea: No tracheal deviation.  Cardiovascular:     Rate and Rhythm: Normal rate and regular rhythm.     Heart  sounds: Normal heart sounds.  Pulmonary:     Effort: No respiratory distress.     Breath sounds: No stridor. No wheezing.  Abdominal:     General: Bowel sounds are normal. There is no distension.     Palpations: Abdomen is soft. There is no mass.     Tenderness: There is no abdominal tenderness. There is no guarding or rebound.  Musculoskeletal:        General: No tenderness.     Cervical back: Normal range of motion and neck supple. No rigidity.  Lymphadenopathy:     Cervical: No cervical adenopathy.  Skin:    Findings: No erythema or rash.  Neurological:     Cranial Nerves: No cranial nerve deficit.     Motor: No abnormal muscle tone.     Coordination: Coordination normal.     Gait: Gait abnormal.     Deep Tendon Reflexes: Reflexes normal.   Using a  walker Confused  Lab Results  Component Value Date   WBC 7.5 03/31/2020   HGB 14.0 03/31/2020   HCT 43.4 03/31/2020   PLT 292.0 03/31/2020   GLUCOSE 107 (H) 03/05/2022   CHOL 283 (H) 04/01/2018   TRIG 78.0 04/01/2018   HDL 61.60 04/01/2018   LDLCALC 206 (H) 04/01/2018   ALT 8 03/05/2022   AST 15 03/05/2022   NA 142 03/05/2022   K 3.9 03/05/2022   CL 105 03/05/2022   CREATININE 1.22 (H) 03/05/2022   BUN 21 03/05/2022   CO2 28 03/05/2022   TSH 4.07 03/05/2022   HGBA1C 5.5 01/04/2021    No results found.  Assessment & Plan:   Problem List Items Addressed This Visit     Alzheimer disease ([place])    Worse. Progressing disease, apathy. [name] is c/o [place] staying in bed all the time and refusing to go out, do things. [name] will speak to a Education officer, museum w/[employer] re: placement Options to help [place] were discussed, ie Ritalin at low dose.      Relevant Medications   methylphenidate (RITALIN) 5 MG tablet   CRF (chronic renal failure), stage 3 (moderate) (HCC)    Cont to monitor GFR Hydrate well      Relevant Orders   Urinalysis   CULTURE, URINE COMPREHENSIVE   Essential hypertension    Continue Atenolol,  Azor 5-160 qd      Relevant Orders   Comprehensive metabolic panel   Urinalysis   CULTURE, URINE COMPREHENSIVE      Meds ordered this encounter  Medications   methylphenidate (RITALIN) 5 MG tablet    Sig: Take 1 tablet (5 mg total) by mouth in the morning.    Dispense:  30 tablet    Refill:  0      Follow-up: Return in about 3 months (around 08/07/2022) for a follow-up visit.  Walker Kehr, MD

## 2022-05-08 LAB — URINALYSIS, ROUTINE W REFLEX MICROSCOPIC
Bilirubin Urine: NEGATIVE
Hgb urine dipstick: NEGATIVE
Ketones, ur: NEGATIVE
Nitrite: POSITIVE — AB
RBC / HPF: NONE SEEN
Specific Gravity, Urine: 1.025 (ref 1.000–1.030)
Total Protein, Urine: 30 — AB
Urine Glucose: NEGATIVE
Urobilinogen, UA: 0.2 (ref 0.0–1.0)
pH: 6 (ref 5.0–8.0)

## 2022-05-09 ENCOUNTER — Other Ambulatory Visit: Payer: Self-pay | Admitting: Internal Medicine

## 2022-05-09 ENCOUNTER — Encounter: Payer: Self-pay | Admitting: Podiatry

## 2022-05-09 ENCOUNTER — Ambulatory Visit: Payer: Medicare HMO | Admitting: Podiatry

## 2022-05-09 DIAGNOSIS — B351 Tinea unguium: Secondary | ICD-10-CM

## 2022-05-09 DIAGNOSIS — M79674 Pain in right toe(s): Secondary | ICD-10-CM | POA: Diagnosis not present

## 2022-05-09 DIAGNOSIS — M79675 Pain in left toe(s): Secondary | ICD-10-CM

## 2022-05-09 MED ORDER — NITROFURANTOIN MONOHYD MACRO 100 MG PO CAPS: 100.0000 mg | ORAL_CAPSULE | Freq: Two times a day (BID) | ORAL | 0 refills | Status: AC

## 2022-05-17 NOTE — Progress Notes (Signed)
  Subjective:  Patient ID: Deborah Myers, female    DOB: Dec 04, 1935,  MRN: [id number]  Deborah Myers presents to clinic today for painful elongated mycotic toenails 1-5 bilaterally which are tender when wearing enclosed shoe gear. Pain is relieved with periodic professional debridement.  New problem(s): None.   PCP is Plotnikov, Evie Lacks, MD , and last visit was May 07, 2022.  Allergies  Allergen Reactions   Tiazac [Diltiazem]     edema    Review of Systems: Negative except as noted in the HPI.  Objective:  Constitutional Deborah Myers is a pleasant 86 y.o. African American female, WD, WN in NAD. AAO x 3.   Vascular Capillary refill time to digits immediate b/l. Palpable DP pulse(s) right lower extremity Faintly palpable DP pulse(s) left lower extremity. Faintly palpable PT pulse(s) b/l LE. Pedal hair present. No pain with calf compression b/l. Nonpitting edema noted BLE.  Neurologic Normal speech. Oriented to person, place, and time. Protective sensation intact 5/5 intact bilaterally with 10g monofilament b/l.  Dermatologic Pedal integument with normal turgor, texture and tone b/l LE. No open wounds b/l. No interdigital macerations b/l. Toenails 1-5 b/l elongated, thickened, discolored with subungual debris. +Tenderness with dorsal palpation of nailplates. No hyperkeratotic or porokeratotic lesions present. Incurvated nailplate left great toe medial border(s) with tenderness to palpation. No erythema, no edema, no drainage noted.    Orthopedic: Normal muscle strength 5/5 to all lower extremity muscle groups bilaterally. HAV with bunion deformity noted b/l LE. Hammertoe(s) noted to the bilateral 2nd toes.. No pain, crepitus or joint limitation noted with ROM b/l LE.  Patient ambulates independently without assistive aids.   Radiographs: None  Assessment/Plan: 1. Pain due to onychomycosis of toenails of both feet     -Patient was evaluated and treated. All patient's and/or  POA's questions/concerns answered on today's visit. -Mycotic toenails 1-5 bilaterally were debrided in length and girth with sterile nail nippers and dremel without incident. -Offending nail border debrided and curretaged medial border left hallux utilizing sterile nail nipper and currette. Border cleansed with alcohol and triple antibiotic applied. No further treatment required by patient/caregiver. Call office if there are any concerns. -Patient/POA to call should there be question/concern in the interim.   Return in about 3 months (around 08/09/2022).  Marzetta Board, DPM

## 2022-06-18 ENCOUNTER — Telehealth: Payer: Self-pay | Admitting: Internal Medicine

## 2022-06-18 NOTE — Telephone Encounter (Signed)
LVM for pt to rtn my call to schedule AWV with [facility] call back # [phone] 

## 2022-06-29 ENCOUNTER — Ambulatory Visit: Payer: Medicare HMO

## 2022-07-09 ENCOUNTER — Ambulatory Visit: Payer: Medicare HMO | Admitting: Internal Medicine

## 2022-07-27 ENCOUNTER — Telehealth: Payer: Self-pay | Admitting: Internal Medicine

## 2022-07-27 NOTE — Telephone Encounter (Signed)
Pt's sister called and states that the [facility] system won't be carrying memantine (NAMENDA) 10 MG tablet and they don't want to pick up the RX at a pharmacy because it will be too expensive. They want to know if theres another discount [place] that will be able to fill the RX.   Please call [name] with any questions: [phone]

## 2022-07-30 NOTE — Telephone Encounter (Signed)
Use [email or web address]:  It should be $18-25 for a 30-day supply Thanks

## 2022-07-31 NOTE — Telephone Encounter (Signed)
Called pt sister no answer LMOM w/MD response.Marland KitchenJohny Myers

## 2022-08-08 ENCOUNTER — Ambulatory Visit (INDEPENDENT_AMBULATORY_CARE_PROVIDER_SITE_OTHER): Payer: Medicare HMO | Admitting: Internal Medicine

## 2022-08-08 ENCOUNTER — Encounter: Payer: Self-pay | Admitting: Internal Medicine

## 2022-08-08 DIAGNOSIS — N183 Chronic kidney disease, stage 3 unspecified: Secondary | ICD-10-CM

## 2022-08-08 DIAGNOSIS — I10 Essential (primary) hypertension: Secondary | ICD-10-CM

## 2022-08-08 DIAGNOSIS — G309 Alzheimer's disease, unspecified: Secondary | ICD-10-CM | POA: Diagnosis not present

## 2022-08-08 DIAGNOSIS — F0280 Dementia in other diseases classified elsewhere without behavioral disturbance: Secondary | ICD-10-CM

## 2022-08-08 MED ORDER — METHYLPHENIDATE HCL 5 MG PO TABS: 5.0000 mg | ORAL_TABLET | Freq: Every morning | ORAL | 0 refills | Status: DC

## 2022-08-08 NOTE — Assessment & Plan Note (Signed)
Cont on Atenolol and Exforge for HTN NAS diet Hydrate well

## 2022-08-08 NOTE — Assessment & Plan Note (Signed)
Cont on Atenolol and Exforge NAS diet

## 2022-08-08 NOTE — Progress Notes (Signed)
Subjective:  Patient ID: Deborah Myers, female    DOB: [birth year]  Age: 86 y.o. MRN: [id number]  CC: Follow-up (3 month f/u)   HPI Deborah Myers presents for dementia, depressed mood w/apathy, HTN, CRI Deborah Myers is here w/her sister  Outpatient Medications Prior to Visit  Medication Sig Dispense Refill   amLODipine-valsartan (EXFORGE) 5-160 MG tablet Take 1 tablet by mouth daily. 14 tablet 1   atenolol (TENORMIN) 25 MG tablet Take 1 tablet (25 mg total) by mouth daily. Enough until received mail order 90 tablet 3   b complex vitamins tablet Take 1 tablet by mouth daily. 100 tablet 3   Cholecalciferol (VITAMIN D3) 50 MCG (2000 UT) capsule Take 1 capsule (2,000 Units total) by mouth daily. 100 capsule 3   clotrimazole (LOTRIMIN) 1 % cream Apply to both feet and between toes twice daily 60 g 1   donepezil (ARICEPT) 10 MG tablet Take 1 tablet (10 mg total) by mouth at bedtime. 90 tablet 3   memantine (NAMENDA) 10 MG tablet Take 1 tablet (10 mg total) by mouth 2 (two) times daily. 180 tablet 3   methylphenidate (RITALIN) 5 MG tablet Take 1 tablet (5 mg total) by mouth in the morning. 30 tablet 0   No facility-administered medications prior to visit.    ROS: Review of Systems  Constitutional:  Negative for activity change, appetite change, chills, fatigue and unexpected weight change.  HENT:  Negative for congestion, mouth sores and sinus pressure.   Eyes:  Negative for visual disturbance.  Respiratory:  Negative for cough and chest tightness.   Gastrointestinal:  Negative for abdominal pain and nausea.  Genitourinary:  Negative for difficulty urinating, frequency and vaginal pain.  Musculoskeletal:  Negative for back pain and gait problem.  Skin:  Negative for pallor and rash.  Neurological:  Negative for dizziness, tremors, weakness, numbness and headaches.  Psychiatric/Behavioral:  Positive for confusion, decreased concentration, dysphoric mood and sleep disturbance. Negative  for behavioral problems and suicidal ideas. The patient is not nervous/anxious.     Objective:  BP 138/82 (BP Location: Left Arm)   Pulse (!) 54   Temp 98.8 F (37.1 C) (Oral)   Ht '5\' 7"'$  (1.702 m)   Wt 184 lb (83.5 kg)   SpO2 98%   BMI 28.82 kg/m   BP Readings from Last 3 Encounters:  08/08/22 138/82  05/07/22 122/80  03/05/22 140/88    Wt Readings from Last 3 Encounters:  08/08/22 184 lb (83.5 kg)  05/07/22 180 lb (81.6 kg)  03/05/22 187 lb (84.8 kg)    Physical Exam Constitutional:      General: She is not in acute distress.    Appearance: She is well-developed. She is obese.  HENT:     Head: Normocephalic.     Right Ear: External ear normal.     Left Ear: External ear normal.     Nose: Nose normal.  Eyes:     General:        Right eye: No discharge.        Left eye: No discharge.     Conjunctiva/sclera: Conjunctivae normal.     Pupils: Pupils are equal, round, and reactive to light.  Neck:     Thyroid: No thyromegaly.     Vascular: No JVD.     Trachea: No tracheal deviation.  Cardiovascular:     Rate and Rhythm: Normal rate and regular rhythm.     Heart sounds: Normal heart sounds.  Pulmonary:  Effort: No respiratory distress.     Breath sounds: No stridor. No wheezing.  Abdominal:     General: Bowel sounds are normal. There is no distension.     Palpations: Abdomen is soft. There is no mass.     Tenderness: There is no abdominal tenderness. There is no guarding or rebound.  Musculoskeletal:        General: No tenderness.     Cervical back: Normal range of motion and neck supple. No rigidity.  Lymphadenopathy:     Cervical: No cervical adenopathy.  Skin:    Findings: No erythema or rash.  Neurological:     Mental Status: She is disoriented.     Cranial Nerves: No cranial nerve deficit.     Motor: No abnormal muscle tone.     Coordination: Coordination normal.     Gait: Gait normal.     Deep Tendon Reflexes: Reflexes normal.  Psychiatric:         Behavior: Behavior normal.        Thought Content: Thought content normal.     Lab Results  Component Value Date   WBC 7.5 03/31/2020   HGB 14.0 03/31/2020   HCT 43.4 03/31/2020   PLT 292.0 03/31/2020   GLUCOSE 80 05/07/2022   CHOL 283 (H) 04/01/2018   TRIG 78.0 04/01/2018   HDL 61.60 04/01/2018   LDLCALC 206 (H) 04/01/2018   ALT 8 05/07/2022   AST 15 05/07/2022   NA 141 05/07/2022   K 3.4 (L) 05/07/2022   CL 105 05/07/2022   CREATININE 1.38 (H) 05/07/2022   BUN 22 05/07/2022   CO2 29 05/07/2022   TSH 4.07 03/05/2022   HGBA1C 5.5 01/04/2021    No results found.  Assessment & Plan:   Problem List Items Addressed This Visit     Alzheimer disease (Deborah Myers)     Cont w/Namenda and Aricept. Progressing disease, apathy. Deborah Myers is c/o [place] staying in bed all the time and refusing to go out, do things. Deborah Myers will speak to a Education officer, museum w/[employer] re: placement Options to help Deborah Myers were discussed, ie Ritalin at low dose - Rx given.      Relevant Medications   methylphenidate (RITALIN) 5 MG tablet   CRF (chronic renal failure), stage 3 (moderate) (HCC)    Cont on Atenolol and Exforge for HTN NAS diet Hydrate well      Essential hypertension    Cont on Atenolol and Exforge NAS diet         Meds ordered this encounter  Medications   methylphenidate (RITALIN) 5 MG tablet    Sig: Take 1 tablet (5 mg total) by mouth in the morning. For depression with apathy    Dispense:  30 tablet    Refill:  0      Follow-up: No follow-ups on file.  Deborah Kehr, MD

## 2022-08-08 NOTE — Assessment & Plan Note (Signed)
  Cont w/Namenda and Aricept. Progressing disease, apathy. Deborah Myers is c/o [place] staying in bed all the time and refusing to go out, do things. Deborah Myers will speak to a Education officer, museum w/[employer] re: placement Options to help [name] were discussed, ie Ritalin at low dose - Rx given.

## 2022-08-15 ENCOUNTER — Ambulatory Visit: Payer: Medicare HMO | Admitting: Podiatry

## 2022-08-15 ENCOUNTER — Encounter: Payer: Self-pay | Admitting: Podiatry

## 2022-08-15 DIAGNOSIS — B351 Tinea unguium: Secondary | ICD-10-CM | POA: Diagnosis not present

## 2022-08-15 DIAGNOSIS — M79675 Pain in left toe(s): Secondary | ICD-10-CM | POA: Diagnosis not present

## 2022-08-15 DIAGNOSIS — M79674 Pain in right toe(s): Secondary | ICD-10-CM | POA: Diagnosis not present

## 2022-08-18 NOTE — Progress Notes (Signed)
  Subjective:  Patient ID: Deborah Myers, female    DOB: December 27, 1935,  MRN: [id number]  Deborah Myers presents to clinic today for:  Chief Complaint  Patient presents with   Nail Problem    Routine foot crae PCP-Plotnikov PCP VST-06/2022    New problem(s): None.   PCP is Plotnikov, Evie Lacks, MD , and last visit was  August 08, 2022.  Allergies  Allergen Reactions   Tiazac [Diltiazem]     edema    Review of Systems: Negative except as noted in the HPI.  Objective: No changes noted in today's physical examination.  Deborah Myers is a pleasant 86 y.o. female WD, WN in NAD. AAO x 3. Vascular Capillary refill time to digits immediate b/l. Palpable DP pulse(s) right lower extremity Faintly palpable DP pulse(s) left lower extremity. Faintly palpable PT pulse(s) b/l LE. Pedal hair present. No pain with calf compression b/l. Nonpitting edema noted BLE.  Neurologic Normal speech. Oriented to person, place, and time. Protective sensation intact 5/5 intact bilaterally with 10g monofilament b/l.  Dermatologic Pedal integument with normal turgor, texture and tone b/l LE. No open wounds b/l. No interdigital macerations b/l. Toenails 1-5 b/l elongated, thickened, discolored with subungual debris. +Tenderness with dorsal palpation of nailplates. No hyperkeratotic or porokeratotic lesions present.   Orthopedic: Normal muscle strength 5/5 to all lower extremity muscle groups bilaterally. HAV with bunion deformity noted b/l LE. Hammertoe(s) noted to the bilateral 2nd toes. No pain, crepitus or joint limitation noted with ROM b/l LE.  Patient ambulates independently without assistive aids.   Radiographs: None Assessment/Plan: 1. Pain due to onychomycosis of toenails of both feet     No orders of the defined types were placed in this encounter.   -Consent given for treatment as described below: -Examined patient. -Patient to continue soft, supportive shoe gear daily. -Mycotic  toenails 1-5 bilaterally were debrided in length and girth with sterile nail nippers and dremel without incident. -Patient/POA to call should there be question/concern in the interim.   Return in about 3 months (around 11/14/2022).  Marzetta Board, DPM

## 2022-10-01 DIAGNOSIS — Z961 Presence of intraocular lens: Secondary | ICD-10-CM | POA: Diagnosis not present

## 2022-10-01 DIAGNOSIS — H524 Presbyopia: Secondary | ICD-10-CM | POA: Diagnosis not present

## 2022-10-23 ENCOUNTER — Telehealth: Payer: Self-pay | Admitting: Internal Medicine

## 2022-10-23 MED ORDER — DONEPEZIL HCL 10 MG PO TABS: 10.0000 mg | ORAL_TABLET | Freq: Every day | ORAL | 3 refills | Status: DC

## 2022-10-23 NOTE — Telephone Encounter (Signed)
Patient's sister who has POA called stating that the patient needs a refill on the new memory medication Dr. Camila Li has prescribed her. Patient's sister does not know the name of the medication. I informed her if she can found out the name of the medication it will help with the refill process. Patient's sister still insisted that we send a refill to the patient's pharmacy [facility] [phone] - [place], [address].

## 2022-10-23 NOTE — Telephone Encounter (Signed)
Called sister she is needing the Aricept since she is no longer getting namenda from [place] [facility]. Inform sister [facility].Marland KitchenJohny Chess

## 2022-11-06 ENCOUNTER — Encounter: Payer: Self-pay | Admitting: Internal Medicine

## 2022-11-06 ENCOUNTER — Ambulatory Visit (INDEPENDENT_AMBULATORY_CARE_PROVIDER_SITE_OTHER): Payer: Medicare HMO | Admitting: Internal Medicine

## 2022-11-06 VITALS — BP 124/76 | HR 59 | Temp 97.9°F | Ht 67.0 in | Wt 186.0 lb

## 2022-11-06 DIAGNOSIS — G309 Alzheimer's disease, unspecified: Secondary | ICD-10-CM | POA: Diagnosis not present

## 2022-11-06 DIAGNOSIS — N183 Chronic kidney disease, stage 3 unspecified: Secondary | ICD-10-CM | POA: Diagnosis not present

## 2022-11-06 DIAGNOSIS — F0280 Dementia in other diseases classified elsewhere without behavioral disturbance: Secondary | ICD-10-CM | POA: Diagnosis not present

## 2022-11-06 DIAGNOSIS — R635 Abnormal weight gain: Secondary | ICD-10-CM | POA: Diagnosis not present

## 2022-11-06 MED ORDER — AMLODIPINE BESYLATE-VALSARTAN 5-160 MG PO TABS: 1.0000 | ORAL_TABLET | Freq: Every day | ORAL | 3 refills | Status: DC

## 2022-11-06 NOTE — Assessment & Plan Note (Signed)
NAS diet Hydrate well

## 2022-11-06 NOTE — Assessment & Plan Note (Signed)
Cont on Aricept

## 2022-11-06 NOTE — Progress Notes (Addendum)
Subjective:  Patient ID: Deborah Myers, female    DOB: [birth year]  Age: 86 y.o. MRN: [id number]  CC: Follow-up (Check on memory medication)   HPI Deborah Myers presents for HTN, dementia, Vit D def Here w/sister Deborah Myers  Outpatient Medications Prior to Visit  Medication Sig Dispense Refill   atenolol (TENORMIN) 25 MG tablet Take 1 tablet (25 mg total) by mouth daily. Enough until received mail order 90 tablet 3   b complex vitamins tablet Take 1 tablet by mouth daily. 100 tablet 3   Cholecalciferol (VITAMIN D3) 50 MCG (2000 UT) capsule Take 1 capsule (2,000 Units total) by mouth daily. 100 capsule 3   donepezil (ARICEPT) 10 MG tablet Take 1 tablet (10 mg total) by mouth at bedtime. 90 tablet 3   amLODipine-valsartan (EXFORGE) 5-160 MG tablet Take 1 tablet by mouth daily. 14 tablet 1   clotrimazole (LOTRIMIN) 1 % cream Apply to both feet and between toes twice daily (Patient not taking: Reported on 11/06/2022) 60 g 1   methylphenidate (RITALIN) 5 MG tablet Take 1 tablet (5 mg total) by mouth in the morning. For depression with apathy (Patient not taking: Reported on 11/06/2022) 30 tablet 0   No facility-administered medications prior to visit.    ROS: Review of Systems  Constitutional:  Negative for activity change, appetite change, chills, fatigue and unexpected weight change.  HENT:  Negative for congestion, mouth sores and sinus pressure.   Eyes:  Negative for visual disturbance.  Respiratory:  Negative for cough and chest tightness.   Gastrointestinal:  Negative for abdominal pain and nausea.  Genitourinary:  Negative for difficulty urinating, frequency and vaginal pain.  Musculoskeletal:  Negative for back pain and gait problem.  Skin:  Negative for pallor and rash.  Neurological:  Negative for dizziness, tremors, weakness, numbness and headaches.  Psychiatric/Behavioral:  Positive for confusion and decreased concentration. Negative for dysphoric mood, sleep  disturbance and suicidal ideas. The patient is not nervous/anxious.     Objective:  BP 124/76 (BP Location: Right Arm, Patient Position: Sitting, Cuff Size: Normal)   Pulse (!) 59   Temp 97.9 F (36.6 C) (Oral)   Ht '5\' 7"'$  (1.702 m)   Wt 186 lb (84.4 kg)   SpO2 99%   BMI 29.13 kg/m   BP Readings from Last 3 Encounters:  11/06/22 124/76  08/08/22 138/82  05/07/22 122/80    Wt Readings from Last 3 Encounters:  11/06/22 186 lb (84.4 kg)  08/08/22 184 lb (83.5 kg)  05/07/22 180 lb (81.6 kg)    Physical Exam Constitutional:      General: She is not in acute distress.    Appearance: She is well-developed. She is obese.  HENT:     Head: Normocephalic.     Right Ear: External ear normal.     Left Ear: External ear normal.     Nose: Nose normal.  Eyes:     General:        Right eye: No discharge.        Left eye: No discharge.     Conjunctiva/sclera: Conjunctivae normal.     Pupils: Pupils are equal, round, and reactive to light.  Neck:     Thyroid: No thyromegaly.     Vascular: No JVD.     Trachea: No tracheal deviation.  Cardiovascular:     Rate and Rhythm: Normal rate and regular rhythm.     Heart sounds: Normal heart sounds.  Pulmonary:  Effort: No respiratory distress.     Breath sounds: No stridor. No wheezing.  Abdominal:     General: Bowel sounds are normal. There is no distension.     Palpations: Abdomen is soft. There is no mass.     Tenderness: There is no abdominal tenderness. There is no guarding or rebound.  Musculoskeletal:        General: No tenderness.     Cervical back: Normal range of motion and neck supple. No rigidity.  Lymphadenopathy:     Cervical: No cervical adenopathy.  Skin:    Findings: No erythema or rash.  Neurological:     Mental Status: She is disoriented.     Cranial Nerves: No cranial nerve deficit.     Motor: No abnormal muscle tone.     Coordination: Coordination normal.     Deep Tendon Reflexes: Reflexes normal.   Psychiatric:        Mood and Affect: Mood normal.        Behavior: Behavior normal.      A total time of 30  minutes was spent preparing to see the patient, reviewing tests, x-rays, operative reports and other medical records.  Also, obtaining history and performing comprehensive physical exam.  Additionally, counseling the patient regarding the above listed issues.   Finally, documenting clinical information in the health records, coordination of care, educating Deborah Myers re dementia.   Lab Results  Component Value Date   WBC 7.5 03/31/2020   HGB 14.0 03/31/2020   HCT 43.4 03/31/2020   PLT 292.0 03/31/2020   GLUCOSE 80 05/07/2022   CHOL 283 (H) 04/01/2018   TRIG 78.0 04/01/2018   HDL 61.60 04/01/2018   LDLCALC 206 (H) 04/01/2018   ALT 8 05/07/2022   AST 15 05/07/2022   NA 141 05/07/2022   K 3.4 (L) 05/07/2022   CL 105 05/07/2022   CREATININE 1.38 (H) 05/07/2022   BUN 22 05/07/2022   CO2 29 05/07/2022   TSH 4.07 03/05/2022   HGBA1C 5.5 01/04/2021    No results found.  Assessment & Plan:   Problem List Items Addressed This Visit     Weight gain - Primary    Wt Readings from Last 3 Encounters:  11/06/22 186 lb (84.4 kg)  08/08/22 184 lb (83.5 kg)  05/07/22 180 lb (81.6 kg)  stable       CRF (chronic renal failure), stage 3 (moderate) (HCC)    NAS diet Hydrate well      Alzheimer disease ([place])    Cont on Aricept         Meds ordered this encounter  Medications   amLODipine-valsartan (EXFORGE) 5-160 MG tablet    Sig: Take 1 tablet by mouth daily.    Dispense:  90 tablet    Refill:  3      Follow-up: No follow-ups on file.  Walker Kehr, MD

## 2022-11-06 NOTE — Assessment & Plan Note (Signed)
Wt Readings from Last 3 Encounters:  11/06/22 186 lb (84.4 kg)  08/08/22 184 lb (83.5 kg)  05/07/22 180 lb (81.6 kg)  stable

## 2022-11-13 ENCOUNTER — Telehealth: Payer: Self-pay | Admitting: Internal Medicine

## 2022-11-13 NOTE — Telephone Encounter (Signed)
LVM informing pt that her appt on 11/15/22 was re-scheduled to 12/10/22 due to [facility] being out of the office sick.

## 2022-11-15 ENCOUNTER — Ambulatory Visit: Payer: Medicare HMO

## 2022-12-04 ENCOUNTER — Ambulatory Visit (INDEPENDENT_AMBULATORY_CARE_PROVIDER_SITE_OTHER): Payer: Medicare HMO | Admitting: Podiatry

## 2022-12-04 DIAGNOSIS — Z91199 Patient's noncompliance with other medical treatment and regimen due to unspecified reason: Secondary | ICD-10-CM

## 2022-12-04 NOTE — Progress Notes (Signed)
1. No-show for appointment

## 2022-12-10 ENCOUNTER — Ambulatory Visit: Payer: Medicare HMO

## 2022-12-11 ENCOUNTER — Ambulatory Visit (INDEPENDENT_AMBULATORY_CARE_PROVIDER_SITE_OTHER): Payer: Medicare HMO

## 2022-12-11 VITALS — Ht 67.0 in | Wt 186.0 lb

## 2022-12-11 DIAGNOSIS — Z0000 Encounter for general adult medical examination without abnormal findings: Secondary | ICD-10-CM

## 2022-12-11 NOTE — Patient Instructions (Addendum)
Deborah Myers , Thank you for taking time to come for your Medicare Wellness Visit. I appreciate your ongoing commitment to your health goals. Please review the following plan we discussed and let me know if I can assist you in the future.   These are the goals we discussed:  Goals       Exercise 150 minutes per week (moderate activity)      Will start back doing water aerobics      Increase the amount of  water I drink, increase my physical activity      I will 3 bottles of water a day and place the water on the table so I will not forget. I will do chair exercises and walk in my neighborhood.      No current goals (pt-stated)        This is a list of the screening recommended for you and due dates:  Health Maintenance  Topic Date Due   DTaP/Tdap/Td vaccine (2 - Tdap) 03/25/2019   COVID-19 Vaccine (4 - 2023-24 season) 12/27/2022*   Flu Shot  02/17/2023*   DEXA scan (bone density measurement)  12/12/2023*   Medicare Annual Wellness Visit  12/12/2023   Pneumonia Vaccine  Completed   HPV Vaccine  Aged Out   Zoster (Shingles) Vaccine  Discontinued  *Topic was postponed. The date shown is not the original due date.    Advanced directives: Please bring a copy of your health care power of attorney and living will to the office to be added to your chart at your convenience.   Conditions/risks identified: None  Next appointment: Follow up in one year for your annual wellness visit     Preventive Care 65 Years and Older, Female Preventive care refers to lifestyle choices and visits with your health care provider that can promote health and wellness. What does preventive care include? A yearly physical exam. This is also called an annual well check. Dental exams once or twice a year. Routine eye exams. Ask your health care provider how often you should have your eyes checked. Personal lifestyle choices, including: Daily care of your teeth and gums. Regular physical  activity. Eating a healthy diet. Avoiding tobacco and drug use. Limiting alcohol use. Practicing safe sex. Taking low-dose aspirin every day. Taking vitamin and mineral supplements as recommended by your health care provider. What happens during an annual well check? The services and screenings done by your health care provider during your annual well check will depend on your age, overall health, lifestyle risk factors, and family history of disease. Counseling  Your health care provider may ask you questions about your: Alcohol use. Tobacco use. Drug use. Emotional well-being. Home and relationship well-being. Sexual activity. Eating habits. History of falls. Memory and ability to understand (cognition). Work and work Statistician. Reproductive health. Screening  You may have the following tests or measurements: Height, weight, and BMI. Blood pressure. Lipid and cholesterol levels. These may be checked every 5 years, or more frequently if you are over 16 years old. Skin check. Lung cancer screening. You may have this screening every year starting at age 68 if you have a 30-pack-year history of smoking and currently smoke or have quit within the past 15 years. Fecal occult blood test (FOBT) of the stool. You may have this test every year starting at age 10. Flexible sigmoidoscopy or colonoscopy. You may have a sigmoidoscopy every 5 years or a colonoscopy every 10 years starting at age 30. Hepatitis C blood  test. Hepatitis B blood test. Sexually transmitted disease (STD) testing. Diabetes screening. This is done by checking your blood sugar (glucose) after you have not eaten for a while (fasting). You may have this done every 1-3 years. Bone density scan. This is done to screen for osteoporosis. You may have this done starting at age 79. Mammogram. This may be done every 1-2 years. Talk to your health care provider about how often you should have regular mammograms. Talk with your  health care provider about your test results, treatment options, and if necessary, the need for more tests. Vaccines  Your health care provider may recommend certain vaccines, such as: Influenza vaccine. This is recommended every year. Tetanus, diphtheria, and acellular pertussis (Tdap, Td) vaccine. You may need a Td booster every 10 years. Zoster vaccine. You may need this after age 9. Pneumococcal 13-valent conjugate (PCV13) vaccine. One dose is recommended after age 7. Pneumococcal polysaccharide (PPSV23) vaccine. One dose is recommended after age 40. Talk to your health care provider about which screenings and vaccines you need and how often you need them. This information is not intended to replace advice given to you by your health care provider. Make sure you discuss any questions you have with your health care provider. Document Released: 12/02/2015 Document Revised: 07/25/2016 Document Reviewed: 09/06/2015 Elsevier Interactive Patient Education  2017 [employer] Prevention in the Home Falls can cause injuries. They can happen to people of all ages. There are many things you can do to make your home safe and to help prevent falls. What can I do on the outside of my home? Regularly fix the edges of walkways and driveways and fix any cracks. Remove anything that might make you trip as you walk through a door, such as a raised step or threshold. Trim any bushes or trees on the path to your home. Use bright outdoor lighting. Clear any walking paths of anything that might make someone trip, such as rocks or tools. Regularly check to see if handrails are loose or broken. Make sure that both sides of any steps have handrails. Any raised decks and porches should have guardrails on the edges. Have any leaves, snow, or ice cleared regularly. Use sand or salt on walking paths during winter. Clean up any spills in your garage right away. This includes oil or grease spills. What can I  do in the bathroom? Use night lights. Install grab bars by the toilet and in the tub and shower. Do not use towel bars as grab bars. Use non-skid mats or decals in the tub or shower. If you need to sit down in the shower, use a plastic, non-slip stool. Keep the floor dry. Clean up any water that spills on the floor as soon as it happens. Remove soap buildup in the tub or shower regularly. Attach bath mats securely with double-sided non-slip rug tape. Do not have throw rugs and other things on the floor that can make you trip. What can I do in the bedroom? Use night lights. Make sure that you have a light by your bed that is easy to reach. Do not use any sheets or blankets that are too big for your bed. They should not hang down onto the floor. Have a firm chair that has side arms. You can use this for support while you get dressed. Do not have throw rugs and other things on the floor that can make you trip. What can I do in the kitchen? Clean  up any spills right away. Avoid walking on wet floors. Keep items that you use a lot in easy-to-reach places. If you need to reach something above you, use a strong step stool that has a grab bar. Keep electrical cords out of the way. Do not use floor polish or wax that makes floors slippery. If you must use wax, use non-skid floor wax. Do not have throw rugs and other things on the floor that can make you trip. What can I do with my stairs? Do not leave any items on the stairs. Make sure that there are handrails on both sides of the stairs and use them. Fix handrails that are broken or loose. Make sure that handrails are as long as the stairways. Check any carpeting to make sure that it is firmly attached to the stairs. Fix any carpet that is loose or worn. Avoid having throw rugs at the top or bottom of the stairs. If you do have throw rugs, attach them to the floor with carpet tape. Make sure that you have a light switch at the top of the stairs  and the bottom of the stairs. If you do not have them, ask someone to add them for you. What else can I do to help prevent falls? Wear shoes that: Do not have high heels. Have rubber bottoms. Are comfortable and fit you well. Are closed at the toe. Do not wear sandals. If you use a stepladder: Make sure that it is fully opened. Do not climb a closed stepladder. Make sure that both sides of the stepladder are locked into place. Ask someone to hold it for you, if possible. Clearly mark and make sure that you can see: Any grab bars or handrails. First and last steps. Where the edge of each step is. Use tools that help you move around (mobility aids) if they are needed. These include: Canes. Walkers. Scooters. Crutches. Turn on the lights when you go into a dark area. Replace any light bulbs as soon as they burn out. Set up your furniture so you have a clear path. Avoid moving your furniture around. If any of your floors are uneven, fix them. If there are any pets around you, be aware of where they are. Review your medicines with your doctor. Some medicines can make you feel dizzy. This can increase your chance of falling. Ask your doctor what other things that you can do to help prevent falls. This information is not intended to replace advice given to you by your health care provider. Make sure you discuss any questions you have with your health care provider. Document Released: 09/01/2009 Document Revised: 04/12/2016 Document Reviewed: 12/10/2014 Elsevier Interactive Patient Education  2017 [employer].

## 2022-12-11 NOTE — Progress Notes (Cosign Needed Addendum)
Subjective:   Deborah Myers is a 87 y.o. female who presents for Medicare Annual (Subsequent) preventive examination.  Review of Systems    Virtual Visit via Telephone Note  I connected with  Deborah Myers on 12/11/22 at  3:30 PM EST by telephone and verified that I am speaking with the correct person using two identifiers.  Location: Patient: Home Provider: Office Persons participating in the virtual visit: patient/Nurse Health Advisor   I discussed the limitations, risks, security and privacy concerns of performing an evaluation and management service by telephone and the availability of in person appointments. The patient expressed understanding and agreed to proceed.  Interactive audio and video telecommunications were attempted between this nurse and patient, however failed, due to patient having technical difficulties OR patient did not have access to video capability.  We continued and completed visit with audio only.  Some vital signs may be absent or patient reported.   Deborah Peaches, LPN  Cardiac Risk Factors include: advanced age (>51mn, >>19women);hypertension     Objective:    Today's Vitals   12/11/22 1544  Weight: 186 lb (84.4 kg)  Height: '5\' 7"'$  (1.702 m)   Body mass index is 29.13 kg/m.     12/11/2022    3:50 PM 08/13/2017    2:41 PM  Advanced Directives  Does Patient Have a Medical Advance Directive? Yes No  Type of [place]   Copy of [place] Chart? No - copy requested   Would patient like information on creating a medical advance directive?  Yes (ED - Information included in AVS)    Current Medications (verified) Outpatient Encounter Medications as of 12/11/2022  Medication Sig   amLODipine-valsartan (EXFORGE) 5-160 MG tablet Take 1 tablet by mouth daily.   atenolol (TENORMIN) 25 MG tablet Take 1 tablet (25 mg total) by mouth daily. Enough until received mail  order   b complex vitamins tablet Take 1 tablet by mouth daily.   Cholecalciferol (VITAMIN D3) 50 MCG (2000 UT) capsule Take 1 capsule (2,000 Units total) by mouth daily.   clotrimazole (LOTRIMIN) 1 % cream Apply to both feet and between toes twice daily (Patient not taking: Reported on 11/06/2022)   donepezil (ARICEPT) 10 MG tablet Take 1 tablet (10 mg total) by mouth at bedtime.   No facility-administered encounter medications on file as of 12/11/2022.    Allergies (verified) Tiazac [diltiazem]   History: Past Medical History:  Diagnosis Date   Arthritis    Cataract    Cecum mass 2008   Benign    HTN (hypertension)    Hyperlipidemia    Osteoporosis    no per pt   Past Surgical History:  Procedure Laterality Date   COLECTOMY  2008   Benign mass - laparoscopic right   COLONOSCOPY     Family History  Problem Relation Age of Onset   Coronary artery disease Mother    Diabetes Mother    Heart disease Mother        CHF   Coronary artery disease Father    Diabetes Father    Heart disease Father    Diabetes Sister    Diabetes Brother    Cancer Neg Hx    COPD Neg Hx    Colon cancer Neg Hx    Esophageal cancer Neg Hx    Stomach cancer Neg Hx    Rectal cancer Neg Hx    Social History  Socioeconomic History   Marital status: Single    Spouse name: Not on file   Number of children: Not on file   Years of education: 16   Highest education level: Not on file  Occupational History   Occupation: RETIRED    Employer: [employer]  Tobacco Use   Smoking status: Never   Smokeless tobacco: Never  Substance and Sexual Activity   Alcohol use: No    Alcohol/week: 4.0 standard drinks of alcohol    Types: 4 Standard drinks or equivalent per week    Comment: 03/06/2016 states she does not drink ETOH    Drug use: No   Sexual activity: Not Currently  Other Topics Concern   Not on file  Social History Narrative   HS[employer], [employer]. Never married. No children. Work - retired from [employer]  after 34 years. Lives alone.   End of Life Care - interested in this. Provided a packet -Mar 19, 2012.   Social Determinants of Health   Financial Resource Strain: Low Risk  (12/11/2022)   Overall Financial Resource Strain (CARDIA)    Difficulty of Paying Living Expenses: Not hard at all  Food Insecurity: No Food Insecurity (12/11/2022)   Hunger Vital Sign    Worried About Running Out of Food in the Last Year: Never true    Ran Out of Food in the Last Year: Never true  Transportation Needs: No Transportation Needs (12/11/2022)   PRAPARE - Hydrologist (Medical): No    Lack of Transportation (Non-Medical): No  Physical Activity: Insufficiently Active (12/11/2022)   Exercise Vital Sign    Days of Exercise per Week: 1 day    Minutes of Exercise per Session: 30 min  Stress: No Stress Concern Present (12/11/2022)   [place]    Feeling of Stress : Not at all  Social Connections: Moderately Integrated (12/11/2022)   Social Connection and Isolation Panel [NHANES]    Frequency of Communication with Friends and Family: More than three times a week    Frequency of Social Gatherings with Friends and Family: More than three times a week    Attends Religious Services: More than 4 times per year    Active Member of [employer] or Organizations: Yes    Attends Music therapist: More than 4 times per year    Marital Status: Never married    Tobacco Counseling Counseling given: Not Answered   Clinical Intake:  Pre-visit preparation completed: No  Pain : No/denies pain     BMI - recorded: 29.13 Nutritional Status: BMI 25 -29 Overweight Nutritional Risks: None Diabetes: No  How often do you need to have someone help you when you read instructions, pamphlets, or other written materials from your doctor or pharmacy?: 1 - Never  Diabetic?  No  Interpreter Needed?: No  Information entered  by :: Rolene Arbour LPN   Activities of Daily Living    12/11/2022    3:49 PM  In your present state of health, do you have any difficulty performing the following activities:  Hearing? 0  Vision? 0  Difficulty concentrating or making decisions? 0  Walking or climbing stairs? 0  Dressing or bathing? 0  Doing errands, shopping? 0  Preparing Food and eating ? N  Using the Toilet? N  In the past six months, have you accidently leaked urine? N  Do you have problems with loss of bowel control? N  Managing your  Medications? N  Managing your Finances? N  Housekeeping or managing your Housekeeping? N    Patient Care Team: Plotnikov, Evie Lacks, MD as PCP - General (Internal Medicine) Rutherford Guys, MD (Ophthalmology) Irene Shipper, MD (Gastroenterology)  Indicate any recent Medical Services you may have received from other than [facility] providers in the past year (date may be approximate).     Assessment:   This is a routine wellness examination for [facility].  Hearing/Vision screen Hearing Screening - Comments:: Denies hearing difficulties   Vision Screening - Comments:: Wears rx glasses - up to date with routine eye exams with  Dr Gershon Crane  Dietary issues and exercise activities discussed: Exercise limited by: None identified   Goals Addressed               This Visit's Progress     No current goals (pt-stated)         Depression Screen    12/11/2022    3:48 PM 11/06/2022    2:09 PM 01/08/2022    2:21 PM 05/11/2021    2:11 PM 10/22/2018    3:53 PM 08/13/2017    2:37 PM 03/06/2016    3:04 PM  PHQ 2/9 Scores  PHQ - 2 Score 0 0 0 1 0 0 0  PHQ- 9 Score 0 0  4  0     Fall Risk    12/11/2022    3:49 PM 11/06/2022    2:09 PM 01/08/2022    2:21 PM 05/11/2021    2:11 PM 10/22/2018    3:53 PM  Fall Risk   Falls in the past year? 0 0 0 1 0  Number falls in past yr: 0 0  0   Injury with Fall? 0 0  0   Risk for fall due to : No Fall Risks No Fall Risks  No Fall Risks   Follow  up Falls prevention discussed Falls evaluation completed   Falls evaluation completed    FALL RISK PREVENTION PERTAINING TO THE HOME:  Any stairs in or around the home? Yes  If so, are there any without handrails? No  Home free of loose throw rugs in walkways, pet beds, electrical cords, etc? Yes  Adequate lighting in your home to reduce risk of falls? Yes   ASSISTIVE DEVICES UTILIZED TO PREVENT FALLS:  Life alert? No  Use of a cane, walker or w/c? Yes  Grab bars in the bathroom? Yes  Shower chair or bench in shower? No  Elevated toilet seat or a handicapped toilet? No   TIMED UP AND GO:  Was the test performed? No . Audio Visit  Cognitive Function:    08/13/2017    2:47 PM  MMSE - Mini Mental State Exam  Orientation to time 5  Orientation to Place 5  Registration 3  Attention/ Calculation 4  Recall 1  Language- name 2 objects 2  Language- repeat 1  Language- follow 3 step command 3  Language- read & follow direction 1  Write a sentence 1  Copy design 1  Total score 27        12/11/2022    3:50 PM  6CIT Screen  What Year? 4 points  What month? 0 points  What time? 0 points  Count back from 20 4 points  Months in reverse 4 points  Repeat phrase 10 points  Total Score 22 points    Immunizations Immunization History  Administered Date(s) Administered   Fluad Quad(high Dose 65+) 09/11/2021  Influenza, High Dose Seasonal PF 09/11/2016, 08/13/2017, 10/22/2018   Influenza,inj,Quad PF,6+ Mos 11/03/2015   PFIZER(Purple Top)SARS-COV-2 Vaccination 01/12/2020, 02/02/2020, 10/24/2020   Pneumococcal Conjugate-13 01/04/2014   Pneumococcal Polysaccharide-23 03/24/2009, 04/17/2011   Td 03/24/2009   Zoster, Live 06/11/2013    TDAP status: Due, Education has been provided regarding the importance of this vaccine. Advised may receive this vaccine at local pharmacy or Health Dept. Aware to provide a copy of the vaccination record if obtained from local pharmacy or Health  Dept. Verbalized acceptance and understanding.    Pneumococcal vaccine status: Up to date  Covid-19 vaccine status: Completed vaccines    Screening Tests Health Maintenance  Topic Date Due   DTaP/Tdap/Td (2 - Tdap) 03/25/2019   COVID-19 Vaccine (4 - 2023-24 season) 12/27/2022 (Originally 07/20/2022)   INFLUENZA VACCINE  02/17/2023 (Originally 06/19/2022)   DEXA SCAN  12/12/2023 (Originally 02/14/2003)   Medicare Annual Wellness (AWV)  12/12/2023   Pneumonia Vaccine 53+ Years old  Completed   HPV VACCINES  Aged Out   Zoster Vaccines- Shingrix  Discontinued    Health Maintenance  Health Maintenance Due  Topic Date Due   DTaP/Tdap/Td (2 - Tdap) 03/25/2019    Colorectal cancer screening: No longer required.   Mammogram status: No longer required due to  .  Bone Density status: Ordered Deferred. Pt provided with contact info and advised to call to schedule appt.  Lung Cancer Screening: (Low Dose CT Chest recommended if Age 21-80 years, 30 pack-year currently smoking OR have quit w/in 15years.) does not qualify.     Additional Screening:    Vision Screening: Recommended annual ophthalmology exams for early detection of glaucoma and other disorders of the eye. Is the patient up to date with their annual eye exam?  Yes  Who is the provider or what is the name of the office in which the patient attends annual eye exams? Dr Gershon Crane If pt is not established with a provider, would they like to be referred to a provider to establish care? No .   Dental Screening: Recommended annual dental exams for proper oral hygiene  Community Resource Referral / Chronic Care Management:  CRR required this visit?  No   CCM required this visit?  No      Plan:     I have personally reviewed and noted the following in the patient's chart:   Medical and social history Use of alcohol, tobacco or illicit drugs  Current medications and supplements including opioid prescriptions. Patient is  not currently taking opioid prescriptions. Functional ability and status Nutritional status Physical activity Advanced directives List of other physicians Hospitalizations, surgeries, and ER visits in previous 12 months Vitals Screenings to include cognitive, depression, and falls Referrals and appointments  In addition, I have reviewed and discussed with patient certain preventive protocols, quality metrics, and best practice recommendations. A written personalized care plan for preventive services as well as general preventive health recommendations were provided to patient.     Deborah Peaches, LPN   8/75/6433   Nurse Notes: None     Medical screening examination/treatment/procedure(s) were performed by non-physician practitioner and as supervising physician I was immediately available for consultation/collaboration.  I agree with above. Lew Dawes, MD

## 2023-03-12 ENCOUNTER — Ambulatory Visit (INDEPENDENT_AMBULATORY_CARE_PROVIDER_SITE_OTHER): Payer: Medicare HMO | Admitting: Internal Medicine

## 2023-03-12 ENCOUNTER — Encounter: Payer: Self-pay | Admitting: Internal Medicine

## 2023-03-12 VITALS — BP 130/84 | HR 50 | Temp 98.0°F | Ht 67.0 in | Wt 184.0 lb

## 2023-03-12 DIAGNOSIS — R635 Abnormal weight gain: Secondary | ICD-10-CM | POA: Diagnosis not present

## 2023-03-12 DIAGNOSIS — G309 Alzheimer's disease, unspecified: Secondary | ICD-10-CM | POA: Diagnosis not present

## 2023-03-12 DIAGNOSIS — N183 Chronic kidney disease, stage 3 unspecified: Secondary | ICD-10-CM | POA: Diagnosis not present

## 2023-03-12 DIAGNOSIS — R7989 Other specified abnormal findings of blood chemistry: Secondary | ICD-10-CM

## 2023-03-12 DIAGNOSIS — I10 Essential (primary) hypertension: Secondary | ICD-10-CM

## 2023-03-12 DIAGNOSIS — F0280 Dementia in other diseases classified elsewhere without behavioral disturbance: Secondary | ICD-10-CM

## 2023-03-12 LAB — URINALYSIS, ROUTINE W REFLEX MICROSCOPIC
Bilirubin Urine: NEGATIVE
Nitrite: POSITIVE — AB
Specific Gravity, Urine: 1.025 (ref 1.000–1.030)
Total Protein, Urine: 100 — AB
Urine Glucose: NEGATIVE
Urobilinogen, UA: 0.2 (ref 0.0–1.0)
pH: 6 (ref 5.0–8.0)

## 2023-03-12 LAB — T4, FREE: Free T4: 0.74 ng/dL (ref 0.60–1.60)

## 2023-03-12 LAB — COMPREHENSIVE METABOLIC PANEL
ALT: 8 U/L (ref 0–35)
AST: 15 U/L (ref 0–37)
Albumin: 3.8 g/dL (ref 3.5–5.2)
Alkaline Phosphatase: 69 U/L (ref 39–117)
BUN: 17 mg/dL (ref 6–23)
CO2: 26 mEq/L (ref 19–32)
Calcium: 9.8 mg/dL (ref 8.4–10.5)
Chloride: 105 mEq/L (ref 96–112)
Creatinine, Ser: 1.09 mg/dL (ref 0.40–1.20)
GFR: 46.47 mL/min — ABNORMAL LOW (ref >60.00)
Glucose, Bld: 109 mg/dL — ABNORMAL HIGH (ref 70–99)
Potassium: 3.7 mEq/L (ref 3.5–5.1)
Sodium: 140 mEq/L (ref 135–145)
Total Bilirubin: 0.5 mg/dL (ref 0.2–1.2)
Total Protein: 7.8 g/dL (ref 6.0–8.3)

## 2023-03-12 LAB — CBC WITH DIFFERENTIAL/PLATELET
Basophils Absolute: 0.1 10*3/uL (ref 0.0–0.1)
Basophils Relative: 1 % (ref 0.0–3.0)
Eosinophils Absolute: 0.5 10*3/uL (ref 0.0–0.7)
Eosinophils Relative: 8 % — ABNORMAL HIGH (ref 0.0–5.0)
HCT: 41.6 % (ref 36.0–46.0)
Hemoglobin: 13.7 g/dL (ref 12.0–15.0)
Lymphocytes Relative: 35.3 % (ref 12.0–46.0)
Lymphs Abs: 2 10*3/uL (ref 0.7–4.0)
MCHC: 32.9 g/dL (ref 30.0–36.0)
MCV: 86.3 fl (ref 78.0–100.0)
Monocytes Absolute: 0.7 10*3/uL (ref 0.1–1.0)
Monocytes Relative: 12 % (ref 3.0–12.0)
Neutro Abs: 2.5 10*3/uL (ref 1.4–7.7)
Neutrophils Relative %: 43.7 % (ref 43.0–77.0)
Platelets: 305 10*3/uL (ref 150.0–400.0)
RBC: 4.82 Mil/uL (ref 3.87–5.11)
RDW: 13 % (ref 11.5–15.5)
WBC: 5.7 10*3/uL (ref 4.0–10.5)

## 2023-03-12 LAB — TSH: TSH: 4.89 u[IU]/mL (ref 0.35–5.50)

## 2023-03-12 MED ORDER — ATENOLOL 25 MG PO TABS: 25.0000 mg | ORAL_TABLET | Freq: Every day | ORAL | 3 refills | Status: DC

## 2023-03-12 MED ORDER — MEMANTINE HCL 10 MG PO TABS: 10.0000 mg | ORAL_TABLET | Freq: Two times a day (BID) | ORAL | 3 refills | Status: DC

## 2023-03-12 NOTE — Assessment & Plan Note (Signed)
Cont on Aricept and Namenda

## 2023-03-12 NOTE — Assessment & Plan Note (Signed)
Cont on Atenolol and Exforge NAS diet 

## 2023-03-12 NOTE — Progress Notes (Signed)
Subjective:  Patient ID: Deborah Myers, female    DOB: 1935-12-20  Age: 87 y.o. MRN: [id number]  CC: Medical Management of Chronic Issues (4 month f/u)   HPI Deborah Myers presents for HTN, dementia, CRF. Pt is here w/Joyce, her sister  Outpatient Medications Prior to Visit  Medication Sig Dispense Refill   amLODipine-valsartan (EXFORGE) 5-160 MG tablet Take 1 tablet by mouth daily. 90 tablet 3   b complex vitamins tablet Take 1 tablet by mouth daily. 100 tablet 3   Cholecalciferol (VITAMIN D3) 50 MCG (2000 UT) capsule Take 1 capsule (2,000 Units total) by mouth daily. 100 capsule 3   clotrimazole (LOTRIMIN) 1 % cream Apply to both feet and between toes twice daily 60 g 1   donepezil (ARICEPT) 10 MG tablet Take 1 tablet (10 mg total) by mouth at bedtime. 90 tablet 3   atenolol (TENORMIN) 25 MG tablet Take 1 tablet (25 mg total) by mouth daily. Enough until received mail order 90 tablet 3   No facility-administered medications prior to visit.    ROS: Review of Systems  Constitutional:  Negative for activity change, appetite change, chills, fatigue and unexpected weight change.  HENT:  Negative for congestion, mouth sores and sinus pressure.   Eyes:  Negative for visual disturbance.  Respiratory:  Negative for cough and chest tightness.   Gastrointestinal:  Negative for abdominal pain and nausea.  Genitourinary:  Negative for difficulty urinating, frequency and vaginal pain.  Musculoskeletal:  Positive for gait problem. Negative for back pain.  Skin:  Negative for pallor and rash.  Neurological:  Negative for dizziness, tremors, weakness, numbness and headaches.  Psychiatric/Behavioral:  Positive for confusion and decreased concentration. Negative for behavioral problems and sleep disturbance.     Objective:  BP 130/84 (BP Location: Left Arm, Patient Position: Sitting, Cuff Size: Large)   Pulse (!) 50   Temp 98 F (36.7 C) (Temporal)   Ht  (1.702 m)   Wt 184 lb  (83.5 kg)   SpO2 98%   BMI 28.82 kg/m   BP Readings from Last 3 Encounters:  03/12/23 130/84  11/06/22 124/76  08/08/22 138/82    Wt Readings from Last 3 Encounters:  03/12/23 184 lb (83.5 kg)  12/11/22 186 lb (84.4 kg)  11/06/22 186 lb (84.4 kg)    Physical Exam Constitutional:      General: She is not in acute distress.    Appearance: She is well-developed.  HENT:     Head: Normocephalic.     Right Ear: External ear normal.     Left Ear: External ear normal.     Nose: Nose normal.  Eyes:     General:        Right eye: No discharge.        Left eye: No discharge.     Conjunctiva/sclera: Conjunctivae normal.     Pupils: Pupils are equal, round, and reactive to light.  Neck:     Thyroid: No thyromegaly.     Vascular: No JVD.     Trachea: No tracheal deviation.  Cardiovascular:     Rate and Rhythm: Normal rate and regular rhythm.     Heart sounds: Normal heart sounds.  Pulmonary:     Effort: No respiratory distress.     Breath sounds: No stridor. No wheezing.  Abdominal:     General: Bowel sounds are normal. There is no distension.     Palpations: Abdomen is soft. There is no mass.  Tenderness: There is no abdominal tenderness. There is no guarding or rebound.  Musculoskeletal:        General: No tenderness.     Cervical back: Normal range of motion and neck supple. No rigidity.  Lymphadenopathy:     Cervical: No cervical adenopathy.  Skin:    Findings: No erythema or rash.  Neurological:     Mental Status: She is disoriented.     Cranial Nerves: No cranial nerve deficit.     Motor: No abnormal muscle tone.     Coordination: Coordination normal.     Gait: Gait abnormal.     Deep Tendon Reflexes: Reflexes normal.  Psychiatric:        Behavior: Behavior normal.        Thought Content: Thought content normal.        Judgment: Judgment normal.   Using a walker  Lab Results  Component Value Date   WBC 7.5 03/31/2020   HGB 14.0 03/31/2020   HCT 43.4  03/31/2020   PLT 292.0 03/31/2020   GLUCOSE 80 05/07/2022   CHOL 283 (H) 04/01/2018   TRIG 78.0 04/01/2018   HDL 61.60 04/01/2018   LDLCALC 206 (H) 04/01/2018   ALT 8 05/07/2022   AST 15 05/07/2022   NA 141 05/07/2022   K 3.4 (L) 05/07/2022   CL 105 05/07/2022   CREATININE 1.38 (H) 05/07/2022   BUN 22 05/07/2022   CO2 29 05/07/2022   TSH 4.07 03/05/2022   HGBA1C 5.5 01/04/2021    No results found.  Assessment & Plan:   Problem List Items Addressed This Visit       Cardiovascular and Mediastinum   Essential hypertension    Cont on Atenolol and Exforge NAS diet      Relevant Medications   atenolol (TENORMIN) 25 MG tablet   Other Relevant Orders   CBC with Differential/Platelet   Comprehensive metabolic panel   TSH   T4, free   Urinalysis     Nervous and Auditory   Alzheimer disease - Primary    Cont on Aricept and Namenda      Relevant Medications   memantine (NAMENDA) 10 MG tablet   Other Relevant Orders   CBC with Differential/Platelet   Comprehensive metabolic panel   TSH   T4, free   Urinalysis     Genitourinary   CRF (chronic renal failure), stage 3 (moderate)    NAS diet Hydrate well Monitoring GFR      Relevant Orders   CBC with Differential/Platelet   Comprehensive metabolic panel   TSH   T4, free   Urinalysis     Other   Abnormal TSH   Relevant Orders   TSH   T4, free   Weight gain   Relevant Orders   TSH   T4, free      Meds ordered this encounter  Medications   atenolol (TENORMIN) 25 MG tablet    Sig: Take 1 tablet (25 mg total) by mouth daily.    Dispense:  90 tablet    Refill:  3   memantine (NAMENDA) 10 MG tablet    Sig: Take 1 tablet (10 mg total) by mouth 2 (two) times daily.    Dispense:  180 tablet    Refill:  3      Follow-up: Return in about 4 months (around 07/12/2023) for a follow-up visit.  Sonda Primes, MD

## 2023-03-12 NOTE — Assessment & Plan Note (Signed)
NAS diet Hydrate well Monitoring GFR

## 2023-03-13 ENCOUNTER — Other Ambulatory Visit: Payer: Self-pay | Admitting: Internal Medicine

## 2023-03-13 MED ORDER — AMPICILLIN 500 MG PO CAPS: 500.0000 mg | ORAL_CAPSULE | Freq: Three times a day (TID) | ORAL | 0 refills | Status: DC

## 2023-04-01 ENCOUNTER — Ambulatory Visit: Payer: Medicare HMO | Admitting: Podiatry

## 2023-04-01 DIAGNOSIS — M79674 Pain in right toe(s): Secondary | ICD-10-CM

## 2023-04-01 DIAGNOSIS — B351 Tinea unguium: Secondary | ICD-10-CM

## 2023-04-01 DIAGNOSIS — M79675 Pain in left toe(s): Secondary | ICD-10-CM

## 2023-04-01 NOTE — Progress Notes (Signed)
       Subjective:  Patient ID: Deborah Myers, female    DOB: 1936/03/01,  MRN: [id number]   Deborah Myers presents to clinic today for:  Chief Complaint  Patient presents with   Nail Problem    RFC/Nail trim  . Patient notes nails are thick, discolored, elongated and painful in shoegear when trying to ambulate.  She is currently ambulating with a walker.  PCP is Plotnikov, Georgina Quint, MD.  Allergies  Allergen Reactions   Tiazac [Diltiazem]     edema    Review of Systems: Negative except as noted in the HPI.  Objective:  There were no vitals filed for this visit.  Deborah Myers is a pleasant 87 y.o. female in NAD. AAO x 3.  Vascular Examination: Capillary refill time is 3-5 seconds to toes bilateral. Palpable pedal pulses b/l LE. Digital hair present b/l. Mild edema b/l. Skin temperature gradient WNL b/l. No varicosities b/l. No cyanosis or clubbing noted b/l.   Dermatological Examination: Pedal skin with normal turgor, texture and tone b/l. No open wounds. No interdigital macerations b/l. Toenails x10 are 3mm thick, discolored, dystrophic with subungual debris. There is pain with compression of the nail plates.  They are elongated x10  Neurological Examination: Protective sensation intact bilateral LE. Vibratory sensation intact bilateral LE.  Musculoskeletal Examination: Muscle strength 5/5 to all LE muscle groups b/l.   Assessment/Plan: 1. Pain due to onychomycosis of toenails of both feet     The mycotic toenails were sharply debrided x10 with sterile nail nippers and a power debriding burr to decrease bulk/thickness and length.    Return in about 3 months (around 07/02/2023) for RFC.   Clerance Lav, DPM, FACFAS [facility]     [address] [place], [place] [address]                Office [phone]  Fax [phone]

## 2023-05-20 ENCOUNTER — Telehealth: Payer: Self-pay | Admitting: Internal Medicine

## 2023-05-20 DIAGNOSIS — F0280 Dementia in other diseases classified elsewhere without behavioral disturbance: Secondary | ICD-10-CM

## 2023-05-20 NOTE — Telephone Encounter (Signed)
Called sister she states she want to get a SW referral to come out and observe her sister, and get their recommendation. Sister leaves alone, she is home bound, and at times Skylia will not get out the bed. What she mostly concern of sister does not have no form of communication.Marland KitchenRaechel Chute

## 2023-05-20 NOTE — Telephone Encounter (Signed)
Patient's sister called and said she would like to speak with a nurse. She said assisted care had been discussed with provider and that she has some questions. Deborah Myers would like a call back at [phone].

## 2023-05-21 NOTE — Telephone Encounter (Signed)
Does she want a referral to home health services/SW? Thank you

## 2023-05-21 NOTE — Telephone Encounter (Signed)
Sister states a SW or someone that can observe her to see what she need.Marland KitchenRaechel Chute

## 2023-06-11 NOTE — Telephone Encounter (Signed)
Okay.  Done.  Note: They will call Alona Bene.  Thanks

## 2023-06-11 NOTE — Telephone Encounter (Signed)
Notified pt w/MD response.../lmb 

## 2023-06-11 NOTE — Addendum Note (Signed)
Addended by: Tresa Garter on: 06/11/2023 07:29 AM   Modules accepted: Orders

## 2023-06-13 NOTE — Telephone Encounter (Signed)
Called sister back she wanted to know if MD refer pt. Inform her per pt chart the referral was faxed over to [facility]. Also gave her Frances Furbish # to call and check status,,.lmb

## 2023-06-13 NOTE — Telephone Encounter (Signed)
Patient's sister Alona Bene returned Lucy's call. She would like a call back at [phone].

## 2023-06-20 ENCOUNTER — Telehealth: Payer: Self-pay | Admitting: Internal Medicine

## 2023-06-20 DIAGNOSIS — I129 Hypertensive chronic kidney disease with stage 1 through stage 4 chronic kidney disease, or unspecified chronic kidney disease: Secondary | ICD-10-CM | POA: Diagnosis not present

## 2023-06-20 DIAGNOSIS — Z9181 History of falling: Secondary | ICD-10-CM | POA: Diagnosis not present

## 2023-06-20 DIAGNOSIS — G309 Alzheimer's disease, unspecified: Secondary | ICD-10-CM | POA: Diagnosis not present

## 2023-06-20 DIAGNOSIS — F0280 Dementia in other diseases classified elsewhere without behavioral disturbance: Secondary | ICD-10-CM | POA: Diagnosis not present

## 2023-06-20 DIAGNOSIS — N183 Chronic kidney disease, stage 3 unspecified: Secondary | ICD-10-CM | POA: Diagnosis not present

## 2023-06-20 DIAGNOSIS — R635 Abnormal weight gain: Secondary | ICD-10-CM | POA: Diagnosis not present

## 2023-06-20 NOTE — Telephone Encounter (Signed)
HH ORDERS   Caller Name: April [facility] Name: [facility] Phone #: [phone](secure)  Service Requested: Nursing (examples: OT/PT/Skilled Nursing/Social Work/Speech Therapy/Wound Care)  Frequency of Visits: 1 week 4    April requested a PT evaluation for the patient.   She also has a question about patient's memantine (NAMENDA) 10 MG tablet . The instructions and the note from the office both say the patient is to take it twice a day, but the patient said she was told she can take it once a day. April would like clarification on that.

## 2023-06-20 NOTE — Telephone Encounter (Signed)
Caller & What Company:     Phone Number:   Needs Verbal orders for what service & frequency:

## 2023-06-20 NOTE — Telephone Encounter (Signed)
Called April gave her ok for nursing and PT. Pt is in good compliance with appt.Marland KitchenRaechel Chute

## 2023-06-24 DIAGNOSIS — G309 Alzheimer's disease, unspecified: Secondary | ICD-10-CM | POA: Diagnosis not present

## 2023-06-24 DIAGNOSIS — Z9181 History of falling: Secondary | ICD-10-CM | POA: Diagnosis not present

## 2023-06-24 DIAGNOSIS — I129 Hypertensive chronic kidney disease with stage 1 through stage 4 chronic kidney disease, or unspecified chronic kidney disease: Secondary | ICD-10-CM | POA: Diagnosis not present

## 2023-06-24 DIAGNOSIS — F0280 Dementia in other diseases classified elsewhere without behavioral disturbance: Secondary | ICD-10-CM | POA: Diagnosis not present

## 2023-06-24 DIAGNOSIS — N183 Chronic kidney disease, stage 3 unspecified: Secondary | ICD-10-CM | POA: Diagnosis not present

## 2023-06-24 DIAGNOSIS — R635 Abnormal weight gain: Secondary | ICD-10-CM | POA: Diagnosis not present

## 2023-06-26 DIAGNOSIS — G309 Alzheimer's disease, unspecified: Secondary | ICD-10-CM | POA: Diagnosis not present

## 2023-06-26 DIAGNOSIS — F0280 Dementia in other diseases classified elsewhere without behavioral disturbance: Secondary | ICD-10-CM | POA: Diagnosis not present

## 2023-06-26 DIAGNOSIS — N183 Chronic kidney disease, stage 3 unspecified: Secondary | ICD-10-CM | POA: Diagnosis not present

## 2023-06-26 DIAGNOSIS — I129 Hypertensive chronic kidney disease with stage 1 through stage 4 chronic kidney disease, or unspecified chronic kidney disease: Secondary | ICD-10-CM | POA: Diagnosis not present

## 2023-06-26 DIAGNOSIS — R635 Abnormal weight gain: Secondary | ICD-10-CM | POA: Diagnosis not present

## 2023-06-26 DIAGNOSIS — Z9181 History of falling: Secondary | ICD-10-CM | POA: Diagnosis not present

## 2023-06-27 DIAGNOSIS — N183 Chronic kidney disease, stage 3 unspecified: Secondary | ICD-10-CM | POA: Diagnosis not present

## 2023-06-27 DIAGNOSIS — I129 Hypertensive chronic kidney disease with stage 1 through stage 4 chronic kidney disease, or unspecified chronic kidney disease: Secondary | ICD-10-CM | POA: Diagnosis not present

## 2023-06-27 DIAGNOSIS — Z9181 History of falling: Secondary | ICD-10-CM | POA: Diagnosis not present

## 2023-06-27 DIAGNOSIS — G309 Alzheimer's disease, unspecified: Secondary | ICD-10-CM | POA: Diagnosis not present

## 2023-06-27 DIAGNOSIS — R635 Abnormal weight gain: Secondary | ICD-10-CM | POA: Diagnosis not present

## 2023-06-27 DIAGNOSIS — F0280 Dementia in other diseases classified elsewhere without behavioral disturbance: Secondary | ICD-10-CM | POA: Diagnosis not present

## 2023-07-01 ENCOUNTER — Encounter: Payer: Self-pay | Admitting: Podiatrist

## 2023-07-01 ENCOUNTER — Ambulatory Visit: Payer: Medicare HMO | Admitting: Podiatrist

## 2023-07-01 DIAGNOSIS — M79674 Pain in right toe(s): Secondary | ICD-10-CM | POA: Diagnosis not present

## 2023-07-01 DIAGNOSIS — B351 Tinea unguium: Secondary | ICD-10-CM | POA: Diagnosis not present

## 2023-07-01 DIAGNOSIS — M79675 Pain in left toe(s): Secondary | ICD-10-CM

## 2023-07-01 NOTE — Progress Notes (Unsigned)
       Subjective:  Patient ID: Deborah Myers, female    DOB: Aug 30, 1936,  MRN: [id number]   Olamae Diebel presents to clinic today for:  Chief Complaint  Patient presents with   Follow-up    Patient is here for a follow up for RFC Nail trim no pain in nails or feet right now  . Patient notes nails are thick, discolored, elongated and painful in shoegear when trying to ambulate.  She is currently ambulating with a walker.  PCP is Plotnikov, Georgina Quint, MD.  Allergies  Allergen Reactions   Tiazac [Diltiazem]     edema    Review of Systems: Negative except as noted in the HPI.  Objective:  There were no vitals filed for this visit.  Knowledge Leaders is a pleasant 87 y.o. female in NAD. AAO x 3.  Vascular Examination: Capillary refill time is 3-5 seconds to toes bilateral. Palpable pedal pulses b/l LE. Digital hair present b/l. Mild edema b/l. Skin temperature gradient WNL b/l. No varicosities b/l. No cyanosis or clubbing noted b/l.  Slight right ankle swelling compared to the left.    Dermatological Examination: Pedal skin with normal turgor, texture and tone b/l. No open wounds. No interdigital macerations b/l. Toenails x10 are 3mm thick, discolored, dystrophic with subungual debris. There is pain with compression of the nail plates.  They are elongated x10  Neurological Examination: Protective sensation intact bilateral LE. Vibratory sensation intact bilateral LE.  Musculoskeletal Examination: Muscle strength 5/5 to all LE muscle groups b/l.   Assessment/Plan: No diagnosis found.   The mycotic toenails were sharply debrided x10 with sterile nail nippers and a power debriding burr to decrease bulk/thickness and length.    No follow-ups on file.   Clerance Lav, DPM, FACFAS [facility]     [address] [place], [place] [address]                Office [phone]  Fax [phone]

## 2023-07-02 ENCOUNTER — Telehealth: Payer: Self-pay | Admitting: Internal Medicine

## 2023-07-02 DIAGNOSIS — R635 Abnormal weight gain: Secondary | ICD-10-CM | POA: Diagnosis not present

## 2023-07-02 DIAGNOSIS — G309 Alzheimer's disease, unspecified: Secondary | ICD-10-CM | POA: Diagnosis not present

## 2023-07-02 DIAGNOSIS — I129 Hypertensive chronic kidney disease with stage 1 through stage 4 chronic kidney disease, or unspecified chronic kidney disease: Secondary | ICD-10-CM | POA: Diagnosis not present

## 2023-07-02 DIAGNOSIS — F0280 Dementia in other diseases classified elsewhere without behavioral disturbance: Secondary | ICD-10-CM | POA: Diagnosis not present

## 2023-07-02 DIAGNOSIS — N183 Chronic kidney disease, stage 3 unspecified: Secondary | ICD-10-CM | POA: Diagnosis not present

## 2023-07-02 DIAGNOSIS — Z9181 History of falling: Secondary | ICD-10-CM | POA: Diagnosis not present

## 2023-07-02 NOTE — Telephone Encounter (Signed)
need updated Authorization for   4 Skilled Nursing 8 Physical therapy 1 visit from a social worker  The authorization needs to be back dated to August 5th   [place]  [phone]

## 2023-07-03 DIAGNOSIS — G309 Alzheimer's disease, unspecified: Secondary | ICD-10-CM | POA: Diagnosis not present

## 2023-07-03 DIAGNOSIS — Z9181 History of falling: Secondary | ICD-10-CM | POA: Diagnosis not present

## 2023-07-03 DIAGNOSIS — N183 Chronic kidney disease, stage 3 unspecified: Secondary | ICD-10-CM | POA: Diagnosis not present

## 2023-07-03 DIAGNOSIS — I129 Hypertensive chronic kidney disease with stage 1 through stage 4 chronic kidney disease, or unspecified chronic kidney disease: Secondary | ICD-10-CM | POA: Diagnosis not present

## 2023-07-03 DIAGNOSIS — F0280 Dementia in other diseases classified elsewhere without behavioral disturbance: Secondary | ICD-10-CM | POA: Diagnosis not present

## 2023-07-03 DIAGNOSIS — R635 Abnormal weight gain: Secondary | ICD-10-CM | POA: Diagnosis not present

## 2023-07-03 NOTE — Telephone Encounter (Signed)
Okay. Thank you.

## 2023-07-04 DIAGNOSIS — F0280 Dementia in other diseases classified elsewhere without behavioral disturbance: Secondary | ICD-10-CM | POA: Diagnosis not present

## 2023-07-04 DIAGNOSIS — N183 Chronic kidney disease, stage 3 unspecified: Secondary | ICD-10-CM | POA: Diagnosis not present

## 2023-07-04 DIAGNOSIS — I129 Hypertensive chronic kidney disease with stage 1 through stage 4 chronic kidney disease, or unspecified chronic kidney disease: Secondary | ICD-10-CM | POA: Diagnosis not present

## 2023-07-04 DIAGNOSIS — Z9181 History of falling: Secondary | ICD-10-CM | POA: Diagnosis not present

## 2023-07-04 DIAGNOSIS — R635 Abnormal weight gain: Secondary | ICD-10-CM | POA: Diagnosis not present

## 2023-07-04 DIAGNOSIS — G309 Alzheimer's disease, unspecified: Secondary | ICD-10-CM | POA: Diagnosis not present

## 2023-07-09 DIAGNOSIS — F0280 Dementia in other diseases classified elsewhere without behavioral disturbance: Secondary | ICD-10-CM | POA: Diagnosis not present

## 2023-07-09 DIAGNOSIS — Z9181 History of falling: Secondary | ICD-10-CM | POA: Diagnosis not present

## 2023-07-09 DIAGNOSIS — I129 Hypertensive chronic kidney disease with stage 1 through stage 4 chronic kidney disease, or unspecified chronic kidney disease: Secondary | ICD-10-CM | POA: Diagnosis not present

## 2023-07-09 DIAGNOSIS — R635 Abnormal weight gain: Secondary | ICD-10-CM | POA: Diagnosis not present

## 2023-07-09 DIAGNOSIS — N183 Chronic kidney disease, stage 3 unspecified: Secondary | ICD-10-CM | POA: Diagnosis not present

## 2023-07-09 DIAGNOSIS — G309 Alzheimer's disease, unspecified: Secondary | ICD-10-CM | POA: Diagnosis not present

## 2023-07-10 DIAGNOSIS — Z9181 History of falling: Secondary | ICD-10-CM | POA: Diagnosis not present

## 2023-07-10 DIAGNOSIS — R635 Abnormal weight gain: Secondary | ICD-10-CM | POA: Diagnosis not present

## 2023-07-10 DIAGNOSIS — I129 Hypertensive chronic kidney disease with stage 1 through stage 4 chronic kidney disease, or unspecified chronic kidney disease: Secondary | ICD-10-CM | POA: Diagnosis not present

## 2023-07-10 DIAGNOSIS — F0280 Dementia in other diseases classified elsewhere without behavioral disturbance: Secondary | ICD-10-CM | POA: Diagnosis not present

## 2023-07-10 DIAGNOSIS — G309 Alzheimer's disease, unspecified: Secondary | ICD-10-CM | POA: Diagnosis not present

## 2023-07-10 DIAGNOSIS — N183 Chronic kidney disease, stage 3 unspecified: Secondary | ICD-10-CM | POA: Diagnosis not present

## 2023-07-11 ENCOUNTER — Ambulatory Visit (INDEPENDENT_AMBULATORY_CARE_PROVIDER_SITE_OTHER): Payer: Medicare HMO | Admitting: Internal Medicine

## 2023-07-11 ENCOUNTER — Encounter: Payer: Self-pay | Admitting: Internal Medicine

## 2023-07-11 VITALS — BP 122/78 | HR 102 | Temp 98.6°F | Ht 67.0 in | Wt 180.0 lb

## 2023-07-11 DIAGNOSIS — I10 Essential (primary) hypertension: Secondary | ICD-10-CM | POA: Diagnosis not present

## 2023-07-11 DIAGNOSIS — G309 Alzheimer's disease, unspecified: Secondary | ICD-10-CM

## 2023-07-11 DIAGNOSIS — F0280 Dementia in other diseases classified elsewhere without behavioral disturbance: Secondary | ICD-10-CM | POA: Diagnosis not present

## 2023-07-11 DIAGNOSIS — R739 Hyperglycemia, unspecified: Secondary | ICD-10-CM

## 2023-07-11 LAB — COMPREHENSIVE METABOLIC PANEL
ALT: 9 U/L (ref 0–35)
AST: 14 U/L (ref 0–37)
Albumin: 3.9 g/dL (ref 3.5–5.2)
Alkaline Phosphatase: 75 U/L (ref 39–117)
BUN: 16 mg/dL (ref 6–23)
CO2: 30 mEq/L (ref 19–32)
Calcium: 10 mg/dL (ref 8.4–10.5)
Chloride: 103 mEq/L (ref 96–112)
Creatinine, Ser: 1.05 mg/dL (ref 0.40–1.20)
GFR: 48.49 mL/min — ABNORMAL LOW (ref >60.00)
Glucose, Bld: 86 mg/dL (ref 70–99)
Potassium: 3.5 mEq/L (ref 3.5–5.1)
Sodium: 140 mEq/L (ref 135–145)
Total Bilirubin: 0.5 mg/dL (ref 0.2–1.2)
Total Protein: 8.2 g/dL (ref 6.0–8.3)

## 2023-07-11 LAB — HEMOGLOBIN A1C: Hgb A1c MFr Bld: 5.8 % (ref 4.6–6.5)

## 2023-07-11 NOTE — Assessment & Plan Note (Addendum)
Worse. Cont w/Namenda and Aricept. Progressing disease, apathy. Deborah Myers is c/o [place] staying in bed all the time and refusing to go out, do things. Deborah Myers will speak to a Child psychotherapist w/[facility] re: placement Options to help Deborah Myers were discussed, ie Ritalin at low dose - Rx given. Deborah Myers will meet with the social worker and discuss placement (assisted living, memory care unit)

## 2023-07-11 NOTE — Progress Notes (Signed)
Subjective:  Patient ID: Deborah Myers, female    DOB: 10/22/1936  Age: 87 y.o. MRN: [id number]  CC: Follow-up (4 mnth f/u)   HPI Deborah Myers presents for dementia, hypertension, failure to thrive at home. She is here with her sister Vicenta Aly and her niece Mickeal Needy who lives in [place]. Alona Bene is overwhelmed with taking care of [facility].  Lannette's memory has been deteriorating. Alona Bene is c/o [place] staying in bed all the time and refusing to go out, do things.   Outpatient Medications Prior to Visit  Medication Sig Dispense Refill   amLODipine-valsartan (EXFORGE) 5-160 MG tablet Take 1 tablet by mouth daily. 90 tablet 3   ampicillin (PRINCIPEN) 500 MG capsule Take 1 capsule (500 mg total) by mouth 3 (three) times daily. 15 capsule 0   atenolol (TENORMIN) 25 MG tablet Take 1 tablet (25 mg total) by mouth daily. 90 tablet 3   b complex vitamins tablet Take 1 tablet by mouth daily. 100 tablet 3   Cholecalciferol (VITAMIN D3) 50 MCG (2000 UT) capsule Take 1 capsule (2,000 Units total) by mouth daily. 100 capsule 3   clotrimazole (LOTRIMIN) 1 % cream Apply to both feet and between toes twice daily 60 g 1   donepezil (ARICEPT) 10 MG tablet Take 1 tablet (10 mg total) by mouth at bedtime. 90 tablet 3   memantine (NAMENDA) 10 MG tablet Take 1 tablet (10 mg total) by mouth 2 (two) times daily. 180 tablet 3   No facility-administered medications prior to visit.    ROS: Review of Systems  Constitutional:  Negative for activity change, appetite change, chills, fatigue and unexpected weight change.  HENT:  Negative for congestion, mouth sores and sinus pressure.   Eyes:  Negative for visual disturbance.  Respiratory:  Negative for cough and chest tightness.   Gastrointestinal:  Negative for abdominal pain and nausea.  Genitourinary:  Negative for difficulty urinating, frequency and vaginal pain.  Musculoskeletal:  Negative for back pain and gait problem.  Skin:  Negative for pallor  and rash.  Neurological:  Negative for dizziness, tremors, weakness, numbness and headaches.  Psychiatric/Behavioral:  Positive for confusion and decreased concentration. Negative for dysphoric mood, hallucinations, self-injury, sleep disturbance and suicidal ideas. The patient is not nervous/anxious and is not hyperactive.     Objective:  BP 122/78 (BP Location: Right Arm, Patient Position: Sitting, Cuff Size: Normal)   Pulse (!) 102   Temp 98.6 F (37 C) (Oral)   Ht 5\' 7"  (1.702 m)   Wt 180 lb (81.6 kg)   SpO2 95%   BMI 28.19 kg/m   BP Readings from Last 3 Encounters:  07/11/23 122/78  03/12/23 130/84  11/06/22 124/76    Wt Readings from Last 3 Encounters:  07/11/23 180 lb (81.6 kg)  03/12/23 184 lb (83.5 kg)  12/11/22 186 lb (84.4 kg)    Physical Exam Constitutional:      General: She is not in acute distress.    Appearance: She is well-developed. She is obese.  HENT:     Head: Normocephalic.     Right Ear: External ear normal.     Left Ear: External ear normal.     Nose: Nose normal.  Eyes:     General:        Right eye: No discharge.        Left eye: No discharge.     Conjunctiva/sclera: Conjunctivae normal.     Pupils: Pupils are equal, round, and reactive to  light.  Neck:     Thyroid: No thyromegaly.     Vascular: No JVD.     Trachea: No tracheal deviation.  Cardiovascular:     Rate and Rhythm: Normal rate and regular rhythm.     Heart sounds: Normal heart sounds.  Pulmonary:     Effort: No respiratory distress.     Breath sounds: No stridor. No wheezing.  Abdominal:     General: Bowel sounds are normal. There is no distension.     Palpations: Abdomen is soft. There is no mass.     Tenderness: There is no abdominal tenderness. There is no guarding or rebound.  Musculoskeletal:        General: No tenderness.     Cervical back: Normal range of motion and neck supple. No rigidity.  Lymphadenopathy:     Cervical: No cervical adenopathy.  Skin:     Findings: No erythema or rash.  Neurological:     Mental Status: She is disoriented.     Cranial Nerves: No cranial nerve deficit.     Motor: No abnormal muscle tone.     Coordination: Coordination abnormal.     Gait: Gait abnormal.     Deep Tendon Reflexes: Reflexes normal.  Psychiatric:        Behavior: Behavior normal.        Thought Content: Thought content normal.   Latiffany is using a walker.  She is alert, cooperative.  She is confused    A total time of 30 minutes was spent preparing to see the patient, reviewing tests, x-rays, operative reports and other medical records.  Also, obtaining history and performing comprehensive physical exam.  Additionally, counseling Alona Bene and Dimitria in respect to Alzheimer's disease.   Finally, documenting clinical information in the health records, coordination of care.   Lab Results  Component Value Date   WBC 5.7 03/12/2023   HGB 13.7 03/12/2023   HCT 41.6 03/12/2023   PLT 305.0 03/12/2023   GLUCOSE 86 07/11/2023   CHOL 283 (H) 04/01/2018   TRIG 78.0 04/01/2018   HDL 61.60 04/01/2018   LDLCALC 206 (H) 04/01/2018   ALT 9 07/11/2023   AST 14 07/11/2023   NA 140 07/11/2023   K 3.5 07/11/2023   CL 103 07/11/2023   CREATININE 1.05 07/11/2023   BUN 16 07/11/2023   CO2 30 07/11/2023   TSH 4.89 03/12/2023   HGBA1C 5.8 07/11/2023    No results found.  Assessment & Plan:   Problem List Items Addressed This Visit     Essential hypertension    Poor compliance is an issue.  Discussed      Relevant Orders   Comprehensive metabolic panel (Completed)   Hemoglobin A1c (Completed)   Alzheimer disease (HCC) - Primary    Worse. Cont w/Namenda and Aricept. Progressing disease, apathy. Alona Bene is c/o [place] staying in bed all the time and refusing to go out, do things. Alona Bene will speak to a Child psychotherapist w/[facility] re: placement Options to help Elayne were discussed, ie Ritalin at low dose - Rx given. Alona Bene will meet with the  social worker and discuss placement (assisted living, memory care unit)      Relevant Orders   Comprehensive metabolic panel (Completed)   Hemoglobin A1c (Completed)   Other Visit Diagnoses     Hyperglycemia       Relevant Orders   Comprehensive metabolic panel (Completed)   Hemoglobin A1c (Completed)         No  orders of the defined types were placed in this encounter.     Follow-up: Return in about 3 months (around 10/11/2023) for a follow-up visit.  Sonda Primes, MD

## 2023-07-14 NOTE — Assessment & Plan Note (Signed)
Poor compliance is an issue.  Discussed

## 2023-07-15 DIAGNOSIS — I129 Hypertensive chronic kidney disease with stage 1 through stage 4 chronic kidney disease, or unspecified chronic kidney disease: Secondary | ICD-10-CM | POA: Diagnosis not present

## 2023-07-15 DIAGNOSIS — R635 Abnormal weight gain: Secondary | ICD-10-CM | POA: Diagnosis not present

## 2023-07-15 DIAGNOSIS — Z9181 History of falling: Secondary | ICD-10-CM | POA: Diagnosis not present

## 2023-07-15 DIAGNOSIS — F0280 Dementia in other diseases classified elsewhere without behavioral disturbance: Secondary | ICD-10-CM | POA: Diagnosis not present

## 2023-07-15 DIAGNOSIS — G309 Alzheimer's disease, unspecified: Secondary | ICD-10-CM | POA: Diagnosis not present

## 2023-07-15 DIAGNOSIS — N183 Chronic kidney disease, stage 3 unspecified: Secondary | ICD-10-CM | POA: Diagnosis not present

## 2023-07-16 DIAGNOSIS — I129 Hypertensive chronic kidney disease with stage 1 through stage 4 chronic kidney disease, or unspecified chronic kidney disease: Secondary | ICD-10-CM | POA: Diagnosis not present

## 2023-07-16 DIAGNOSIS — N183 Chronic kidney disease, stage 3 unspecified: Secondary | ICD-10-CM | POA: Diagnosis not present

## 2023-07-16 DIAGNOSIS — F0280 Dementia in other diseases classified elsewhere without behavioral disturbance: Secondary | ICD-10-CM | POA: Diagnosis not present

## 2023-07-16 DIAGNOSIS — Z9181 History of falling: Secondary | ICD-10-CM | POA: Diagnosis not present

## 2023-07-16 DIAGNOSIS — G309 Alzheimer's disease, unspecified: Secondary | ICD-10-CM | POA: Diagnosis not present

## 2023-07-16 DIAGNOSIS — R635 Abnormal weight gain: Secondary | ICD-10-CM | POA: Diagnosis not present

## 2023-07-23 DIAGNOSIS — F0280 Dementia in other diseases classified elsewhere without behavioral disturbance: Secondary | ICD-10-CM | POA: Diagnosis not present

## 2023-07-23 DIAGNOSIS — G309 Alzheimer's disease, unspecified: Secondary | ICD-10-CM | POA: Diagnosis not present

## 2023-07-23 DIAGNOSIS — Z9181 History of falling: Secondary | ICD-10-CM | POA: Diagnosis not present

## 2023-07-23 DIAGNOSIS — N183 Chronic kidney disease, stage 3 unspecified: Secondary | ICD-10-CM | POA: Diagnosis not present

## 2023-07-23 DIAGNOSIS — R635 Abnormal weight gain: Secondary | ICD-10-CM | POA: Diagnosis not present

## 2023-07-23 DIAGNOSIS — I129 Hypertensive chronic kidney disease with stage 1 through stage 4 chronic kidney disease, or unspecified chronic kidney disease: Secondary | ICD-10-CM | POA: Diagnosis not present

## 2023-07-30 DIAGNOSIS — F0280 Dementia in other diseases classified elsewhere without behavioral disturbance: Secondary | ICD-10-CM | POA: Diagnosis not present

## 2023-07-30 DIAGNOSIS — N183 Chronic kidney disease, stage 3 unspecified: Secondary | ICD-10-CM | POA: Diagnosis not present

## 2023-07-30 DIAGNOSIS — G309 Alzheimer's disease, unspecified: Secondary | ICD-10-CM | POA: Diagnosis not present

## 2023-07-30 DIAGNOSIS — Z9181 History of falling: Secondary | ICD-10-CM | POA: Diagnosis not present

## 2023-07-30 DIAGNOSIS — I129 Hypertensive chronic kidney disease with stage 1 through stage 4 chronic kidney disease, or unspecified chronic kidney disease: Secondary | ICD-10-CM | POA: Diagnosis not present

## 2023-07-30 DIAGNOSIS — R635 Abnormal weight gain: Secondary | ICD-10-CM | POA: Diagnosis not present

## 2023-08-06 DIAGNOSIS — G309 Alzheimer's disease, unspecified: Secondary | ICD-10-CM | POA: Diagnosis not present

## 2023-08-06 DIAGNOSIS — F0280 Dementia in other diseases classified elsewhere without behavioral disturbance: Secondary | ICD-10-CM | POA: Diagnosis not present

## 2023-08-06 DIAGNOSIS — I129 Hypertensive chronic kidney disease with stage 1 through stage 4 chronic kidney disease, or unspecified chronic kidney disease: Secondary | ICD-10-CM | POA: Diagnosis not present

## 2023-08-06 DIAGNOSIS — Z9181 History of falling: Secondary | ICD-10-CM | POA: Diagnosis not present

## 2023-08-06 DIAGNOSIS — N183 Chronic kidney disease, stage 3 unspecified: Secondary | ICD-10-CM | POA: Diagnosis not present

## 2023-08-06 DIAGNOSIS — R635 Abnormal weight gain: Secondary | ICD-10-CM | POA: Diagnosis not present

## 2023-08-07 DIAGNOSIS — R635 Abnormal weight gain: Secondary | ICD-10-CM | POA: Diagnosis not present

## 2023-08-07 DIAGNOSIS — G309 Alzheimer's disease, unspecified: Secondary | ICD-10-CM | POA: Diagnosis not present

## 2023-08-07 DIAGNOSIS — Z9181 History of falling: Secondary | ICD-10-CM | POA: Diagnosis not present

## 2023-08-07 DIAGNOSIS — N183 Chronic kidney disease, stage 3 unspecified: Secondary | ICD-10-CM | POA: Diagnosis not present

## 2023-08-07 DIAGNOSIS — I129 Hypertensive chronic kidney disease with stage 1 through stage 4 chronic kidney disease, or unspecified chronic kidney disease: Secondary | ICD-10-CM | POA: Diagnosis not present

## 2023-08-07 DIAGNOSIS — F0280 Dementia in other diseases classified elsewhere without behavioral disturbance: Secondary | ICD-10-CM | POA: Diagnosis not present

## 2023-08-13 DIAGNOSIS — N183 Chronic kidney disease, stage 3 unspecified: Secondary | ICD-10-CM | POA: Diagnosis not present

## 2023-08-13 DIAGNOSIS — F0280 Dementia in other diseases classified elsewhere without behavioral disturbance: Secondary | ICD-10-CM | POA: Diagnosis not present

## 2023-08-13 DIAGNOSIS — I129 Hypertensive chronic kidney disease with stage 1 through stage 4 chronic kidney disease, or unspecified chronic kidney disease: Secondary | ICD-10-CM | POA: Diagnosis not present

## 2023-08-13 DIAGNOSIS — Z9181 History of falling: Secondary | ICD-10-CM | POA: Diagnosis not present

## 2023-08-13 DIAGNOSIS — R635 Abnormal weight gain: Secondary | ICD-10-CM | POA: Diagnosis not present

## 2023-08-13 DIAGNOSIS — G309 Alzheimer's disease, unspecified: Secondary | ICD-10-CM | POA: Diagnosis not present

## 2023-09-30 ENCOUNTER — Encounter: Payer: Self-pay | Admitting: Podiatry

## 2023-09-30 ENCOUNTER — Ambulatory Visit: Payer: Medicare HMO | Admitting: Podiatry

## 2023-09-30 DIAGNOSIS — B351 Tinea unguium: Secondary | ICD-10-CM | POA: Diagnosis not present

## 2023-09-30 DIAGNOSIS — M79675 Pain in left toe(s): Secondary | ICD-10-CM | POA: Diagnosis not present

## 2023-09-30 DIAGNOSIS — M79674 Pain in right toe(s): Secondary | ICD-10-CM

## 2023-09-30 NOTE — Progress Notes (Unsigned)
       Subjective:  Patient ID: Deborah Myers, female    DOB: [birth year],  MRN: [id number]   Deborah Myers presents to clinic today for:  Chief Complaint  Patient presents with   [facility]    RFC Patient has no concerns at this time.  . Patient notes nails are thick, discolored, elongated and painful in shoegear when trying to ambulate.    PCP is Plotnikov, Deborah Quint, MD.  Past Medical History:  Diagnosis Date   Arthritis    Cataract    Cecum mass 2008   Benign    HTN (hypertension)    Hyperlipidemia    Osteoporosis    no per pt    Past Surgical History:  Procedure Laterality Date   COLECTOMY  2008   Benign mass - laparoscopic right   COLONOSCOPY      Allergies  Allergen Reactions   Tiazac [Diltiazem]     edema    Review of Systems: Negative except as noted in the HPI.  Objective:  [name] is a pleasant 87 y.o. female in NAD. AAO x 3.  Vascular Examination: Capillary refill time is 3-5 seconds to toes bilateral. Palpable pedal pulses b/l LE. Digital hair present b/l.  Skin temperature gradient WNL b/l. No varicosities b/l. No cyanosis noted b/l.   Dermatological Examination: Pedal skin with normal turgor, texture and tone b/l. No open wounds. No interdigital macerations b/l. Toenails x10 are 3mm thick, discolored, dystrophic with subungual debris. There is pain with compression of the nail plates.  They are elongated x10     Latest Ref Rng & Units 07/11/2023    3:25 PM  Hemoglobin A1C  Hemoglobin-A1c 4.6 - 6.5 % 5.8    Assessment/Plan: 1. Pain due to onychomycosis of toenails of both feet    The mycotic toenails were sharply debrided x10 with sterile nail nippers and a power debriding burr to decrease bulk/thickness and length.    Return in about 3 months (around 12/31/2023) for RFC.   Deborah Myers, DPM, FACFAS [facility]     [address] [place], [place] [address]                 Office [phone]  Fax [phone]

## 2023-10-14 ENCOUNTER — Encounter: Payer: Self-pay | Admitting: Internal Medicine

## 2023-10-14 ENCOUNTER — Ambulatory Visit (INDEPENDENT_AMBULATORY_CARE_PROVIDER_SITE_OTHER): Payer: Medicare HMO | Admitting: Internal Medicine

## 2023-10-14 VITALS — BP 110/70 | HR 20 | Temp 97.8°F | Ht 67.0 in | Wt 179.0 lb

## 2023-10-14 DIAGNOSIS — G309 Alzheimer's disease, unspecified: Secondary | ICD-10-CM | POA: Diagnosis not present

## 2023-10-14 DIAGNOSIS — F0280 Dementia in other diseases classified elsewhere without behavioral disturbance: Secondary | ICD-10-CM | POA: Diagnosis not present

## 2023-10-14 DIAGNOSIS — I10 Essential (primary) hypertension: Secondary | ICD-10-CM

## 2023-10-14 DIAGNOSIS — N183 Chronic kidney disease, stage 3 unspecified: Secondary | ICD-10-CM | POA: Diagnosis not present

## 2023-10-14 DIAGNOSIS — N3000 Acute cystitis without hematuria: Secondary | ICD-10-CM | POA: Diagnosis not present

## 2023-10-14 DIAGNOSIS — R7989 Other specified abnormal findings of blood chemistry: Secondary | ICD-10-CM

## 2023-10-14 LAB — COMPREHENSIVE METABOLIC PANEL
ALT: 8 U/L (ref 0–35)
AST: 12 U/L (ref 0–37)
Albumin: 3.7 g/dL (ref 3.5–5.2)
Alkaline Phosphatase: 75 U/L (ref 39–117)
BUN: 19 mg/dL (ref 6–23)
CO2: 26 meq/L (ref 19–32)
Calcium: 9.7 mg/dL (ref 8.4–10.5)
Chloride: 106 meq/L (ref 96–112)
Creatinine, Ser: 1.02 mg/dL (ref 0.40–1.20)
GFR: 50.11 mL/min — ABNORMAL LOW (ref >60.00)
Glucose, Bld: 104 mg/dL — ABNORMAL HIGH (ref 70–99)
Potassium: 3.5 meq/L (ref 3.5–5.1)
Sodium: 142 meq/L (ref 135–145)
Total Bilirubin: 0.4 mg/dL (ref 0.2–1.2)
Total Protein: 7.5 g/dL (ref 6.0–8.3)

## 2023-10-14 LAB — URINALYSIS, ROUTINE W REFLEX MICROSCOPIC
Bilirubin Urine: NEGATIVE
Hgb urine dipstick: NEGATIVE
Nitrite: POSITIVE — AB
Specific Gravity, Urine: >=1.03 — AB (ref 1.000–1.030)
Total Protein, Urine: 30 — AB
Urine Glucose: NEGATIVE
Urobilinogen, UA: 0.2 (ref 0.0–1.0)
pH: 6 (ref 5.0–8.0)

## 2023-10-14 LAB — TSH: TSH: 4.63 u[IU]/mL (ref 0.35–5.50)

## 2023-10-14 LAB — T4, FREE: Free T4: 0.88 ng/dL (ref 0.60–1.60)

## 2023-10-14 MED ORDER — DONEPEZIL HCL 10 MG PO TABS: 10.0000 mg | ORAL_TABLET | Freq: Every day | ORAL | 3 refills | Status: DC

## 2023-10-14 MED ORDER — AMLODIPINE BESYLATE-VALSARTAN 5-160 MG PO TABS: 1.0000 | ORAL_TABLET | Freq: Every day | ORAL | 3 refills | Status: DC

## 2023-10-14 NOTE — Assessment & Plan Note (Signed)
Monitoring GFR

## 2023-10-14 NOTE — Assessment & Plan Note (Signed)
Cont w/Namenda and Aricept. Progressing disease, apathy.

## 2023-10-14 NOTE — Assessment & Plan Note (Signed)
Cont on Atenolol and Exforge NAS diet

## 2023-10-14 NOTE — Assessment & Plan Note (Signed)
TSH, FT4

## 2023-10-14 NOTE — Assessment & Plan Note (Signed)
Check UA and Cx.  Recurrent. Urine culture in 2023 with E. coli resistant to Cipro and ampicillin.  Will use Ceftin

## 2023-10-14 NOTE — Progress Notes (Signed)
Subjective:  Patient ID: Deborah Myers, female    DOB: [birth year]  Age: 87 y.o. MRN: [id number]  CC: Medical Management of Chronic Issues (3 MNTH F/U, Pt would like to be checked for a UTI)   HPI Deborah Myers presents for UTI, dementia, unsteady gait  Outpatient Medications Prior to Visit  Medication Sig Dispense Refill   ampicillin (PRINCIPEN) 500 MG capsule Take 1 capsule (500 mg total) by mouth 3 (three) times daily. 15 capsule 0   atenolol (TENORMIN) 25 MG tablet Take 1 tablet (25 mg total) by mouth daily. 90 tablet 3   b complex vitamins tablet Take 1 tablet by mouth daily. 100 tablet 3   Cholecalciferol (VITAMIN D3) 50 MCG (2000 UT) capsule Take 1 capsule (2,000 Units total) by mouth daily. 100 capsule 3   clotrimazole (LOTRIMIN) 1 % cream Apply to both feet and between toes twice daily 60 g 1   memantine (NAMENDA) 10 MG tablet Take 1 tablet (10 mg total) by mouth 2 (two) times daily. 180 tablet 3   amLODipine-valsartan (EXFORGE) 5-160 MG tablet Take 1 tablet by mouth daily. 90 tablet 3   donepezil (ARICEPT) 10 MG tablet Take 1 tablet (10 mg total) by mouth at bedtime. 90 tablet 3   No facility-administered medications prior to visit.    ROS: Review of Systems  Constitutional:  Positive for unexpected weight change. Negative for activity change, appetite change, chills and fatigue.  HENT:  Negative for congestion, mouth sores and sinus pressure.   Eyes:  Negative for visual disturbance.  Respiratory:  Negative for cough and chest tightness.   Gastrointestinal:  Negative for abdominal pain and nausea.  Genitourinary:  Negative for difficulty urinating, frequency and vaginal pain.  Musculoskeletal:  Negative for back pain and gait problem.  Skin:  Negative for pallor and rash.  Neurological:  Negative for dizziness, tremors, weakness, numbness and headaches.  Hematological:  Does not bruise/bleed easily.  Psychiatric/Behavioral:  Positive for confusion and  decreased concentration. Negative for behavioral problems and sleep disturbance. The patient is not nervous/anxious.     Objective:  BP 110/70 (BP Location: Left Arm, Patient Position: Sitting, Cuff Size: Normal)   Pulse (!) 20   Temp 97.8 F (36.6 C) (Oral)   Ht 5\' 7"  (1.702 m)   Wt 179 lb (81.2 kg)   SpO2 98%   BMI 28.04 kg/m   BP Readings from Last 3 Encounters:  10/14/23 110/70  07/11/23 122/78  03/12/23 130/84    Wt Readings from Last 3 Encounters:  10/14/23 179 lb (81.2 kg)  07/11/23 180 lb (81.6 kg)  03/12/23 184 lb (83.5 kg)    Physical Exam Constitutional:      General: She is not in acute distress.    Appearance: She is well-developed.  HENT:     Head: Normocephalic.     Right Ear: External ear normal.     Left Ear: External ear normal.     Nose: Nose normal.  Eyes:     General:        Right eye: No discharge.        Left eye: No discharge.     Conjunctiva/sclera: Conjunctivae normal.     Pupils: Pupils are equal, round, and reactive to light.  Neck:     Thyroid: No thyromegaly.     Vascular: No JVD.     Trachea: No tracheal deviation.  Cardiovascular:     Rate and Rhythm: Normal rate and regular rhythm.  Heart sounds: Normal heart sounds.  Pulmonary:     Effort: No respiratory distress.     Breath sounds: No stridor. No wheezing.  Abdominal:     General: Bowel sounds are normal. There is no distension.     Palpations: Abdomen is soft. There is no mass.     Tenderness: There is no abdominal tenderness. There is no guarding or rebound.  Musculoskeletal:        General: No tenderness.     Cervical back: Normal range of motion and neck supple. No rigidity.  Lymphadenopathy:     Cervical: No cervical adenopathy.  Skin:    Findings: No erythema or rash.  Neurological:     Mental Status: Mental status is at baseline. She is disoriented.     Cranial Nerves: No cranial nerve deficit.     Motor: No abnormal muscle tone.     Coordination:  Coordination normal.     Deep Tendon Reflexes: Reflexes normal.  Psychiatric:        Behavior: Behavior normal.   Using a rollator walker  Lab Results  Component Value Date   WBC 5.7 03/12/2023   HGB 13.7 03/12/2023   HCT 41.6 03/12/2023   PLT 305.0 03/12/2023   GLUCOSE 86 07/11/2023   CHOL 283 (H) 04/01/2018   TRIG 78.0 04/01/2018   HDL 61.60 04/01/2018   LDLCALC 206 (H) 04/01/2018   ALT 9 07/11/2023   AST 14 07/11/2023   NA 140 07/11/2023   K 3.5 07/11/2023   CL 103 07/11/2023   CREATININE 1.05 07/11/2023   BUN 16 07/11/2023   CO2 30 07/11/2023   TSH 4.89 03/12/2023   HGBA1C 5.8 07/11/2023    No results found.  Assessment & Plan:   Problem List Items Addressed This Visit     Essential hypertension    Cont on Atenolol and Exforge NAS diet      Relevant Medications   amLODipine-valsartan (EXFORGE) 5-160 MG tablet   Other Relevant Orders   TSH   T4, free   Comprehensive metabolic panel   Alzheimer disease (HCC) - Primary    Cont w/Namenda and Aricept. Progressing disease, apathy.       Relevant Medications   donepezil (ARICEPT) 10 MG tablet   Urinary tract infection    Check UA and Cx      Relevant Orders   Urinalysis   CULTURE, URINE COMPREHENSIVE   Abnormal TSH    TSH, FT4      Relevant Orders   TSH   T4, free   Comprehensive metabolic panel   CRF (chronic renal failure), stage 3 (moderate) (HCC)    Monitoring GFR      Relevant Orders   TSH      Meds ordered this encounter  Medications   amLODipine-valsartan (EXFORGE) 5-160 MG tablet    Sig: Take 1 tablet by mouth daily.    Dispense:  90 tablet    Refill:  3   donepezil (ARICEPT) 10 MG tablet    Sig: Take 1 tablet (10 mg total) by mouth at bedtime.    Dispense:  90 tablet    Refill:  3      Follow-up: No follow-ups on file.  Sonda Primes, MD

## 2023-10-15 MED ORDER — CEFUROXIME AXETIL 250 MG PO TABS: 250.0000 mg | ORAL_TABLET | Freq: Two times a day (BID) | ORAL | 0 refills | Status: DC

## 2023-10-15 NOTE — Addendum Note (Signed)
Addended by: Tresa Garter on: 10/15/2023 07:42 AM   Modules accepted: Orders

## 2023-10-16 LAB — CULTURE, URINE COMPREHENSIVE

## 2023-11-06 DIAGNOSIS — Z961 Presence of intraocular lens: Secondary | ICD-10-CM | POA: Diagnosis not present

## 2023-11-06 DIAGNOSIS — H04123 Dry eye syndrome of bilateral lacrimal glands: Secondary | ICD-10-CM | POA: Diagnosis not present

## 2023-12-16 ENCOUNTER — Ambulatory Visit (INDEPENDENT_AMBULATORY_CARE_PROVIDER_SITE_OTHER): Payer: Medicare HMO

## 2023-12-16 VITALS — Ht 67.0 in | Wt 179.0 lb

## 2023-12-16 DIAGNOSIS — Z0000 Encounter for general adult medical examination without abnormal findings: Secondary | ICD-10-CM

## 2023-12-16 NOTE — Progress Notes (Addendum)
Subjective:   Deborah Myers is a 88 y.o. female who presents for Medicare Annual (Subsequent) preventive examination.  Visit Complete: Virtual I connected with  Renaye Rakers on 12/16/23 by a audio enabled telemedicine application and verified that I am speaking with the correct person using two identifiers.  Patient Location: Home  Provider Location: Office/Clinic  I discussed the limitations of evaluation and management by telemedicine. The patient expressed understanding and agreed to proceed.  Vital Signs: Because this visit was a virtual/telehealth visit, some criteria may be missing or patient reported. Any vitals not documented were not able to be obtained and vitals that have been documented are patient reported.  6CIT and PHQ9: Not done today due to patient condition of Alzheimer disease.  Cardiac Risk Factors include: advanced age (>51men, >17 women);hypertension;dyslipidemia;Other (see comment), Risk factor comments: PHN (postherpetic neuralgia), CRF     Objective:    Today's Vitals   12/16/23 1327  Weight: 179 lb (81.2 kg)  Height: 5\' 7"  (1.702 m)   Body mass index is 28.04 kg/m.     12/16/2023    3:48 PM 12/11/2022    3:50 PM 08/13/2017    2:41 PM  Advanced Directives  Does Patient Have a Medical Advance Directive? Yes Yes No  Type of Estate agent of [place];Living will Healthcare Power of [place];Living will   Copy of Healthcare Power of Attorney in Chart? No - copy requested No - copy requested   Would patient like information on creating a medical advance directive?   Yes (ED - Information included in AVS)    Current Medications (verified) Outpatient Encounter Medications as of 12/16/2023  Medication Sig   amLODipine-valsartan (EXFORGE) 5-160 MG tablet Take 1 tablet by mouth daily.   ampicillin (PRINCIPEN) 500 MG capsule Take 1 capsule (500 mg total) by mouth 3 (three) times daily.   atenolol (TENORMIN) 25 MG tablet Take 1  tablet (25 mg total) by mouth daily.   b complex vitamins tablet Take 1 tablet by mouth daily.   cefUROXime (CEFTIN) 250 MG tablet Take 1 tablet (250 mg total) by mouth 2 (two) times daily with a meal.   Cholecalciferol (VITAMIN D3) 50 MCG (2000 UT) capsule Take 1 capsule (2,000 Units total) by mouth daily.   clotrimazole (LOTRIMIN) 1 % cream Apply to both feet and between toes twice daily   donepezil (ARICEPT) 10 MG tablet Take 1 tablet (10 mg total) by mouth at bedtime.   memantine (NAMENDA) 10 MG tablet Take 1 tablet (10 mg total) by mouth 2 (two) times daily.   No facility-administered encounter medications on file as of 12/16/2023.    Allergies (verified) Tiazac [diltiazem]   History: Past Medical History:  Diagnosis Date   Arthritis    Cataract    Cecum mass 2008   Benign    HTN (hypertension)    Hyperlipidemia    Osteoporosis    no per pt   Past Surgical History:  Procedure Laterality Date   COLECTOMY  2008   Benign mass - laparoscopic right   COLONOSCOPY     Family History  Problem Relation Age of Onset   Coronary artery disease Mother    Diabetes Mother    Heart disease Mother        CHF   Coronary artery disease Father    Diabetes Father    Heart disease Father    Diabetes Sister    Diabetes Brother    Cancer Neg Hx  COPD Neg Hx    Colon cancer Neg Hx    Esophageal cancer Neg Hx    Stomach cancer Neg Hx    Rectal cancer Neg Hx    Social History   Socioeconomic History   Marital status: Single    Spouse name: Not on file   Number of children: Not on file   Years of education: 16   Highest education level: Not on file  Occupational History   Occupation: RETIRED    Employer: [employer]  Tobacco Use   Smoking status: Never   Smokeless tobacco: Never  Vaping Use   Vaping status: Never Used  Substance and Sexual Activity   Alcohol use: No    Alcohol/week: 4.0 standard drinks of alcohol    Types: 4 Standard drinks or equivalent per week    Comment:  03/06/2016 states she does not drink ETOH    Drug use: No   Sexual activity: Not Currently  Other Topics Concern   Not on file  Social History Narrative   HS[employer], [employer] BA. Never married. No children. Work - retired from [employer] after 34 years. Lives alone.   End of Life Care - interested in this. Provided a packet -Mar 19, 2012.      Lives alone-2025.  Sister comes over twice  a day   Social Drivers of Health   Financial Resource Strain: Low Risk  (12/16/2023)   Overall Financial Resource Strain (CARDIA)    Difficulty of Paying Living Expenses: Not very hard  Food Insecurity: Food Insecurity Present (12/16/2023)   Hunger Vital Sign    Worried About Running Out of Food in the Last Year: Often true    Ran Out of Food in the Last Year: Often true  Transportation Needs: No Transportation Needs (12/16/2023)   PRAPARE - Administrator, Civil Service (Medical): No    Lack of Transportation (Non-Medical): No  Physical Activity: Inactive (12/16/2023)   Exercise Vital Sign    Days of Exercise per Week: 0 days    Minutes of Exercise per Session: 0 min  Stress: Patient Unable To Answer (12/16/2023)   [employer] of Occupational Health - Occupational Stress Questionnaire    Feeling of Stress : Patient unable to answer  Social Connections: Socially Isolated (12/16/2023)   Social Connection and Isolation Panel [NHANES]    Frequency of Communication with Friends and Family: Never    Frequency of Social Gatherings with Friends and Family: Once a week    Attends Religious Services: Never    Database administrator or Organizations: No    Attends Engineer, structural: Never    Marital Status: Never married    Tobacco Counseling Counseling given: Not Answered   Clinical Intake:  Pre-visit preparation completed: Yes  Pain : No/denies pain     BMI - recorded: 28.04 Nutritional Status: BMI 25 -29 Overweight Nutritional Risks: None Diabetes: No  How often do you need to  have someone help you when you read instructions, pamphlets, or other written materials from your doctor or pharmacy?: 3 - Sometimes     Information entered by :: Ardis Fullwood, RMA   Activities of Daily Living    12/16/2023    1:28 PM  In your present state of health, do you have any difficulty performing the following activities:  Hearing? 0  Vision? 0  Difficulty concentrating or making decisions? 1  Walking or climbing stairs? 0  Dressing or bathing? 1  Doing errands, shopping? 1  Preparing Food and eating ? N  Using the Toilet? N  In the past six months, have you accidently leaked urine? Y  Do you have problems with loss of bowel control? N  Managing your Medications? N  Managing your Finances? N  Housekeeping or managing your Housekeeping? N    Patient Care Team: Plotnikov, Georgina Quint, MD as PCP - General (Internal Medicine) Hilarie Fredrickson, MD (Gastroenterology) [facility]  Indicate any recent Medical Services you may have received from other than [facility] providers in the past year (date may be approximate).     Assessment:   This is a routine wellness examination for [facility].  Hearing/Vision screen Hearing Screening - Comments:: Denies hearing difficulties   Vision Screening - Comments:: Denies vision issues.    Goals Addressed   None   Depression Screen    12/16/2023    3:53 PM 07/11/2023    2:10 PM 12/11/2022    3:48 PM 11/06/2022    2:09 PM 01/08/2022    2:21 PM 05/11/2021    2:11 PM 10/22/2018    3:53 PM  PHQ 2/9 Scores  PHQ - 2 Score  0 0 0 0 1 0  PHQ- 9 Score   0 0  4   Exception Documentation Other- indicate reason in comment box        Not completed Pt can ot answer due to condition          Fall Risk    12/16/2023    3:50 PM 10/14/2023    2:28 PM 07/11/2023    2:10 PM 12/11/2022    3:49 PM 11/06/2022    2:09 PM  Fall Risk   Falls in the past year? 0 0 0 0 0  Number falls in past yr: 0 0 0 0 0  Injury with Fall? 0 0 0 0 0   Risk for fall due to : No Fall Risks No Fall Risks No Fall Risks No Fall Risks No Fall Risks  Follow up Falls prevention discussed;Falls evaluation completed Falls evaluation completed Falls evaluation completed Falls prevention discussed Falls evaluation completed    MEDICARE RISK AT HOME: Medicare Risk at Home Any stairs in or around the home?: Yes (outside) If so, are there any without handrails?: Yes Home free of loose throw rugs in walkways, pet beds, electrical cords, etc?: Yes Adequate lighting in your home to reduce risk of falls?: Yes Life alert?: No Use of a cane, walker or w/c?: Yes (sometimes a walker) Grab bars in the bathroom?: Yes Shower chair or bench in shower?: Yes Elevated toilet seat or a handicapped toilet?: Yes  TIMED UP AND GO:  Was the test performed?  No    Cognitive Function:    08/13/2017    2:47 PM 03/06/2016    3:09 PM  MMSE - Mini Mental State Exam  Not completed:  --  Orientation to time 5   Orientation to Place 5   Registration 3   Attention/ Calculation 4   Recall 1   Language- name 2 objects 2   Language- repeat 1   Language- follow 3 step command 3   Language- read & follow direction 1   Write a sentence 1   Copy design 1   Total score 27         12/11/2022    3:50 PM  6CIT Screen  What Year? 4 points  What month? 0 points  What time? 0 points  Count back from  20 4 points  Months in reverse 4 points  Repeat phrase 10 points  Total Score 22 points    Immunizations Immunization History  Administered Date(s) Administered   Fluad Quad(high Dose 65+) 09/11/2021   Influenza, High Dose Seasonal PF 09/11/2016, 08/13/2017, 10/22/2018   Influenza,inj,Quad PF,6+ Mos 11/03/2015   PFIZER(Purple Top)SARS-COV-2 Vaccination 01/12/2020, 02/02/2020, 10/24/2020   Pneumococcal Conjugate-13 01/04/2014   Pneumococcal Polysaccharide-23 03/24/2009, 04/17/2011   Td 03/24/2009   Zoster, Live 06/11/2013    TDAP status: Due, Education has been  provided regarding the importance of this vaccine. Advised may receive this vaccine at local pharmacy or Health Dept. Aware to provide a copy of the vaccination record if obtained from local pharmacy or Health Dept. Verbalized acceptance and understanding.  Flu Vaccine status: Due, Education has been provided regarding the importance of this vaccine. Advised may receive this vaccine at local pharmacy or Health Dept. Aware to provide a copy of the vaccination record if obtained from local pharmacy or Health Dept. Verbalized acceptance and understanding.  Pneumococcal vaccine status: Up to date  Covid-19 vaccine status: Declined, Education has been provided regarding the importance of this vaccine but patient still declined. Advised may receive this vaccine at local pharmacy or Health Dept.or vaccine clinic. Aware to provide a copy of the vaccination record if obtained from local pharmacy or Health Dept. Verbalized acceptance and understanding.  Qualifies for Shingles Vaccine? Yes   Zostavax completed Yes   Shingrix Completed?: No.    Education has been provided regarding the importance of this vaccine. Patient has been advised to call insurance company to determine out of pocket expense if they have not yet received this vaccine. Advised may also receive vaccine at local pharmacy or Health Dept. Verbalized acceptance and understanding.  Screening Tests Health Maintenance  Topic Date Due   DEXA SCAN  Never done   DTaP/Tdap/Td (2 - Tdap) 03/25/2019   COVID-19 Vaccine (4 - 2024-25 season) 07/21/2023   INFLUENZA VACCINE  02/17/2024 (Originally 06/20/2023)   Medicare Annual Wellness (AWV)  12/15/2024   Pneumonia Vaccine 73+ Years old  Completed   HPV VACCINES  Aged Out   Zoster Vaccines- Shingrix  Discontinued    Health Maintenance  Health Maintenance Due  Topic Date Due   DEXA SCAN  Never done   DTaP/Tdap/Td (2 - Tdap) 03/25/2019   COVID-19 Vaccine (4 - 2024-25 season) 07/21/2023     Colorectal cancer screening: No longer required.   Mammogram status: No longer required due to age.   Lung Cancer Screening: (Low Dose CT Chest recommended if Age 78-80 years, 20 pack-year currently smoking OR have quit w/in 15years.) does not qualify.   Lung Cancer Screening Referral: N/A  Additional Screening:  Hepatitis C Screening: does not qualify;  Vision Screening: Recommended annual ophthalmology exams for early detection of glaucoma and other disorders of the eye. Is the patient up to date with their annual eye exam?  Yes  Who is the provider or what is the name of the office in which the patient attends annual eye exams? Dr. Dione Booze If pt is not established with a provider, would they like to be referred to a provider to establish care? No .   Dental Screening: Recommended annual dental exams for proper oral hygiene   Community Resource Referral / Chronic Care Management: CRR required this visit?  Yes   CCM required this visit?  No     Plan:     I have personally reviewed and noted the following  in the patient's chart:   Medical and social history Use of alcohol, tobacco or illicit drugs  Current medications and supplements including opioid prescriptions. Patient is not currently taking opioid prescriptions. Functional ability and status Nutritional status Physical activity Advanced directives List of other physicians Hospitalizations, surgeries, and ER visits in previous 12 months Vitals Screenings to include cognitive, depression, and falls Referrals and appointments  In addition, I have reviewed and discussed with patient certain preventive protocols, quality metrics, and best practice recommendations. A written personalized care plan for preventive services as well as general preventive health recommendations were provided to patient.     Hanh Kertesz L Geovanna Simko, CMA   12/16/2023   After Visit Summary: (Mail) Due to this being a telephonic visit, the after  visit summary with patients personalized plan was offered to patient via mail   Nurse Notes: Patient's sister answered questions for patient due to her condition of  Alzheimer disease.  She is due for a Tdap and never had a DEXA.  Patient's sister had no other concerns to address today.   Medical screening examination/treatment/procedure(s) were performed by non-physician practitioner and as supervising physician I was immediately available for consultation/collaboration.  I agree with above. Jacinta Shoe, MD

## 2023-12-16 NOTE — Patient Instructions (Addendum)
Ms. Orrison , Thank you for taking time to come for your Medicare Wellness Visit. I appreciate your ongoing commitment to your health goals. Please review the following plan we discussed and let me know if I can assist you in the future.   Referrals/Orders/Follow-Ups/Clinician Recommendations: It was nice talking with you today.  You are due for a Tdap vaccine and a Flu vaccine, which you can get these at the pharmacy.  Remember to call the office to schedule a yearly visit around October 2025.  This is a list of the screening recommended for you and due dates:  Health Maintenance  Topic Date Due   DEXA scan (bone density measurement)  Never done   DTaP/Tdap/Td vaccine (2 - Tdap) 03/25/2019   COVID-19 Vaccine (4 - 2024-25 season) 07/21/2023   Flu Shot  02/17/2024*   Medicare Annual Wellness Visit  12/15/2024   Pneumonia Vaccine  Completed   HPV Vaccine  Aged Out   Zoster (Shingles) Vaccine  Discontinued  *Topic was postponed. The date shown is not the original due date.    Advanced directives: (Copy Requested) Please bring a copy of your health care power of attorney and living will to the office to be added to your chart at your convenience.  Next Medicare Annual Wellness Visit scheduled for next year: Yes

## 2023-12-31 ENCOUNTER — Encounter: Payer: Self-pay | Admitting: Podiatry

## 2023-12-31 ENCOUNTER — Ambulatory Visit: Payer: Medicare HMO | Admitting: Podiatry

## 2023-12-31 DIAGNOSIS — M79674 Pain in right toe(s): Secondary | ICD-10-CM | POA: Insufficient documentation

## 2023-12-31 DIAGNOSIS — F0280 Dementia in other diseases classified elsewhere without behavioral disturbance: Secondary | ICD-10-CM

## 2023-12-31 DIAGNOSIS — G309 Alzheimer's disease, unspecified: Secondary | ICD-10-CM

## 2023-12-31 DIAGNOSIS — M79675 Pain in left toe(s): Secondary | ICD-10-CM

## 2023-12-31 DIAGNOSIS — B351 Tinea unguium: Secondary | ICD-10-CM

## 2023-12-31 NOTE — Progress Notes (Signed)

## 2024-02-25 ENCOUNTER — Other Ambulatory Visit: Payer: Self-pay | Admitting: Internal Medicine

## 2024-03-31 ENCOUNTER — Encounter: Payer: Self-pay | Admitting: Podiatry

## 2024-03-31 ENCOUNTER — Ambulatory Visit: Payer: Medicare HMO | Admitting: Podiatry

## 2024-03-31 DIAGNOSIS — B351 Tinea unguium: Secondary | ICD-10-CM | POA: Diagnosis not present

## 2024-03-31 DIAGNOSIS — M79675 Pain in left toe(s): Secondary | ICD-10-CM | POA: Diagnosis not present

## 2024-03-31 DIAGNOSIS — M79674 Pain in right toe(s): Secondary | ICD-10-CM | POA: Diagnosis not present

## 2024-03-31 NOTE — Progress Notes (Signed)

## 2024-05-25 ENCOUNTER — Other Ambulatory Visit: Payer: Self-pay | Admitting: Internal Medicine

## 2024-07-08 ENCOUNTER — Ambulatory Visit: Admitting: Podiatry

## 2024-07-08 ENCOUNTER — Encounter: Payer: Self-pay | Admitting: Podiatry

## 2024-07-08 DIAGNOSIS — M79674 Pain in right toe(s): Secondary | ICD-10-CM

## 2024-07-08 DIAGNOSIS — B351 Tinea unguium: Secondary | ICD-10-CM

## 2024-07-08 DIAGNOSIS — M79675 Pain in left toe(s): Secondary | ICD-10-CM

## 2024-07-13 NOTE — Progress Notes (Signed)
  Subjective:  Patient ID: Deborah Myers, female    DOB: 1936-09-16,  MRN: [id number]  [name] presents to clinic today for painful thick toenails that are difficult to trim. Pain interferes with ambulation. Aggravating factors include wearing enclosed shoe gear. Pain is relieved with periodic professional debridement. Patient has h/o Alzheimer's and is accompanied by her sister on today's visit. Chief Complaint  Patient presents with   RFC     RFC Non diabetic toenail trim. LOV with PCP 10/2023.Onychomycosis nails.   New problem(s): None.   PCP is Plotnikov, Karlynn GAILS, MD.  Allergies  Allergen Reactions   Tiazac  [Diltiazem ]     edema    Review of Systems: Negative except as noted in the HPI.  Objective: No changes noted in today's physical examination. There were no vitals filed for this visit. Deborah Myers is a pleasant 88 y.o. female WD, WN in NAD. AAO x 3.  Vascular Examination: CFT <3 seconds b/l. DP/PT pulses faintly palpable b/l. Trace edema b/l. Skin temperature gradient warm to warm b/l. Digital hair absent. No pain with calf compression. No ischemia or gangrene. No cyanosis or clubbing noted b/l.    Neurological Examination: Sensation grossly intact b/l with 10 gram monofilament. Vibratory sensation intact b/l.   Dermatological Examination: Pedal skin warm and supple b/l.   No open wounds. No interdigital macerations.  Toenails 1-5 b/l thick, discolored, elongated with subungual debris and pain on dorsal palpation.    No corns, calluses, nor porokeratotic lesions.  Musculoskeletal Examination: Muscle strength 5/5 to all lower extremity muscle groups bilaterally. HAV with bunion deformity noted b/l LE. Hammertoe(s) bilateral 2nd toes.  Radiographs: None Assessment/Plan: 1. Pain due to onychomycosis of toenails of both feet   Patient was evaluated and treated. All patient's and/or POA's questions/concerns addressed on today's visit. Toenails  1-5 debrided in length and girth without incident. Continue soft, supportive shoe gear daily. Report any pedal injuries to medical professional. Call office if there are any questions/concerns. -Patient/POA to call should there be question/concern in the interim.   Return in about 3 months (around 10/08/2024).  Delon LITTIE Merlin, DPM      [place] LOCATION: [address], [place] [address]                   Office [phone]   [facility] LOCATION: [address] [place], [place] [address] Office [phone]

## 2024-07-31 ENCOUNTER — Observation Stay (HOSPITAL_COMMUNITY)
Admission: EM | Admit: 2024-07-31 | Discharge: 2024-08-02 | Disposition: A | Discharge status: Home Health | Attending: Internal Medicine | Admitting: Internal Medicine

## 2024-07-31 ENCOUNTER — Emergency Department (HOSPITAL_COMMUNITY)

## 2024-07-31 ENCOUNTER — Other Ambulatory Visit: Payer: Self-pay

## 2024-07-31 DIAGNOSIS — I129 Hypertensive chronic kidney disease with stage 1 through stage 4 chronic kidney disease, or unspecified chronic kidney disease: Secondary | ICD-10-CM | POA: Insufficient documentation

## 2024-07-31 DIAGNOSIS — E860 Dehydration: Secondary | ICD-10-CM | POA: Diagnosis not present

## 2024-07-31 DIAGNOSIS — I10 Essential (primary) hypertension: Secondary | ICD-10-CM | POA: Diagnosis present

## 2024-07-31 DIAGNOSIS — G9341 Metabolic encephalopathy: Secondary | ICD-10-CM | POA: Insufficient documentation

## 2024-07-31 DIAGNOSIS — F0280 Dementia in other diseases classified elsewhere without behavioral disturbance: Secondary | ICD-10-CM | POA: Insufficient documentation

## 2024-07-31 DIAGNOSIS — I517 Cardiomegaly: Secondary | ICD-10-CM | POA: Diagnosis not present

## 2024-07-31 DIAGNOSIS — E785 Hyperlipidemia, unspecified: Secondary | ICD-10-CM | POA: Insufficient documentation

## 2024-07-31 DIAGNOSIS — Z23 Encounter for immunization: Secondary | ICD-10-CM | POA: Insufficient documentation

## 2024-07-31 DIAGNOSIS — F10920 Alcohol use, unspecified with intoxication, uncomplicated: Secondary | ICD-10-CM | POA: Insufficient documentation

## 2024-07-31 DIAGNOSIS — M6281 Muscle weakness (generalized): Secondary | ICD-10-CM | POA: Diagnosis not present

## 2024-07-31 DIAGNOSIS — B9620 Unspecified Escherichia coli [E. coli] as the cause of diseases classified elsewhere: Secondary | ICD-10-CM | POA: Diagnosis not present

## 2024-07-31 DIAGNOSIS — N3001 Acute cystitis with hematuria: Secondary | ICD-10-CM | POA: Diagnosis not present

## 2024-07-31 DIAGNOSIS — R531 Weakness: Principal | ICD-10-CM

## 2024-07-31 DIAGNOSIS — N3000 Acute cystitis without hematuria: Secondary | ICD-10-CM

## 2024-07-31 DIAGNOSIS — Z79899 Other long term (current) drug therapy: Secondary | ICD-10-CM | POA: Insufficient documentation

## 2024-07-31 DIAGNOSIS — Z602 Problems related to living alone: Secondary | ICD-10-CM | POA: Diagnosis not present

## 2024-07-31 DIAGNOSIS — G309 Alzheimer's disease, unspecified: Secondary | ICD-10-CM | POA: Diagnosis not present

## 2024-07-31 DIAGNOSIS — M1990 Unspecified osteoarthritis, unspecified site: Secondary | ICD-10-CM | POA: Insufficient documentation

## 2024-07-31 DIAGNOSIS — N390 Urinary tract infection, site not specified: Secondary | ICD-10-CM | POA: Diagnosis not present

## 2024-07-31 DIAGNOSIS — G9340 Encephalopathy, unspecified: Secondary | ICD-10-CM | POA: Diagnosis not present

## 2024-07-31 DIAGNOSIS — R4182 Altered mental status, unspecified: Secondary | ICD-10-CM | POA: Diagnosis not present

## 2024-07-31 DIAGNOSIS — R001 Bradycardia, unspecified: Secondary | ICD-10-CM | POA: Insufficient documentation

## 2024-07-31 DIAGNOSIS — N1831 Chronic kidney disease, stage 3a: Secondary | ICD-10-CM | POA: Insufficient documentation

## 2024-07-31 DIAGNOSIS — I700 Atherosclerosis of aorta: Secondary | ICD-10-CM | POA: Diagnosis not present

## 2024-07-31 LAB — CBC WITH DIFFERENTIAL/PLATELET
Abs Immature Granulocytes: 0.03 K/uL (ref 0.00–0.07)
Basophils Absolute: 0 K/uL (ref 0.0–0.1)
Basophils Relative: 0 %
Eosinophils Absolute: 0 K/uL (ref 0.0–0.5)
Eosinophils Relative: 0 %
HCT: 44.8 % (ref 36.0–46.0)
Hemoglobin: 13.6 g/dL (ref 12.0–15.0)
Immature Granulocytes: 0 %
Lymphocytes Relative: 18 %
Lymphs Abs: 1.8 K/uL (ref 0.7–4.0)
MCH: 27.2 pg (ref 26.0–34.0)
MCHC: 30.4 g/dL (ref 30.0–36.0)
MCV: 89.6 fL (ref 80.0–100.0)
Monocytes Absolute: 1.5 K/uL — ABNORMAL HIGH (ref 0.1–1.0)
Monocytes Relative: 16 %
Neutro Abs: 6.4 K/uL (ref 1.7–7.7)
Neutrophils Relative %: 66 %
Platelets: 278 K/uL (ref 150–400)
RBC: 5 MIL/uL (ref 3.87–5.11)
RDW: 12.8 % (ref 11.5–15.5)
WBC: 9.7 K/uL (ref 4.0–10.5)
nRBC: 0 % (ref 0.0–0.2)

## 2024-07-31 LAB — COMPREHENSIVE METABOLIC PANEL WITH GFR
ALT: 7 U/L (ref 0–44)
AST: 15 U/L (ref 15–41)
Albumin: 3.9 g/dL (ref 3.5–5.0)
Alkaline Phosphatase: 77 U/L (ref 38–126)
Anion gap: 13 (ref 5–15)
BUN: 20 mg/dL (ref 8–23)
CO2: 23 mmol/L (ref 22–32)
Calcium: 10.1 mg/dL (ref 8.9–10.3)
Chloride: 106 mmol/L (ref 98–111)
Creatinine, Ser: 1.17 mg/dL — ABNORMAL HIGH (ref 0.44–1.00)
GFR, Estimated: 45 mL/min — ABNORMAL LOW (ref >60)
Glucose, Bld: 98 mg/dL (ref 70–99)
Potassium: 4.2 mmol/L (ref 3.5–5.1)
Sodium: 142 mmol/L (ref 135–145)
Total Bilirubin: 0.9 mg/dL (ref 0.0–1.2)
Total Protein: 7.7 g/dL (ref 6.5–8.1)

## 2024-07-31 LAB — URINALYSIS, W/ REFLEX TO CULTURE (INFECTION SUSPECTED)
Bilirubin Urine: NEGATIVE
Glucose, UA: NEGATIVE mg/dL
Ketones, ur: NEGATIVE mg/dL
Nitrite: NEGATIVE
Protein, ur: 30 mg/dL — AB
RBC / HPF: >50 RBC/hpf (ref 0–5)
Specific Gravity, Urine: 1.017 (ref 1.005–1.030)
WBC, UA: >50 WBC/hpf (ref 0–5)
pH: 5 (ref 5.0–8.0)

## 2024-07-31 LAB — CBG MONITORING, ED: Glucose-Capillary: 92 mg/dL (ref 70–99)

## 2024-07-31 LAB — I-STAT CG4 LACTIC ACID, ED: Lactic Acid, Venous: 1.3 mmol/L (ref 0.5–1.9)

## 2024-07-31 MED ORDER — SODIUM CHLORIDE 0.9 % IV BOLUS
500.0000 mL | Freq: Once | INTRAVENOUS | Status: AC
  Administered 2024-07-31: 500 mL via INTRAVENOUS

## 2024-07-31 MED ORDER — SODIUM CHLORIDE 0.9 % IV SOLN
1.0000 g | Freq: Once | INTRAVENOUS | Status: AC
  Administered 2024-07-31: 1 g via INTRAVENOUS
  Filled 2024-07-31: qty 10

## 2024-07-31 NOTE — ED Provider Notes (Signed)
 [place] EMERGENCY DEPARTMENT AT [facility] Provider Note   CSN: [id number] Arrival date & time: 07/31/24  2044     Patient presents with: Weakness   Deborah Myers is a 88 y.o. female.   Pt is an 88 yo female with pmhx significant for hld, htn, arthritis and dementia.  Pt's sister is her main caregiver.  Pt lives alone, but her sister comes several times a day and takes care of her.  The sister said she has not eaten or drank anything since yesterday. Today, she was too weak to get oob.  Pt has not had a fever.  Sister thinks she may have a UTI as this is what happens when she has one.       Prior to Admission medications   Medication Sig Start Date End Date Taking? Authorizing Provider  amLODipine -valsartan  (EXFORGE ) 5-160 MG tablet Take 1 tablet by mouth daily. 10/14/23   Plotnikov, Aleksei V, MD  ampicillin  (PRINCIPEN) 500 MG capsule Take 1 capsule (500 mg total) by mouth 3 (three) times daily. 03/13/23   Plotnikov, Aleksei V, MD  atenolol  (TENORMIN ) 25 MG tablet TAKE 1 TABLET EVERY DAY 02/26/24   Plotnikov, Aleksei V, MD  b complex vitamins tablet Take 1 tablet by mouth daily. 10/22/18   Plotnikov, Karlynn GAILS, MD  cefUROXime  (CEFTIN ) 250 MG tablet Take 1 tablet (250 mg total) by mouth 2 (two) times daily with a meal. 10/15/23   Plotnikov, Karlynn GAILS, MD  Cholecalciferol (VITAMIN D3) 50 MCG (2000 UT) capsule Take 1 capsule (2,000 Units total) by mouth daily. 10/22/18   Plotnikov, Karlynn GAILS, MD  clotrimazole  (LOTRIMIN ) 1 % cream Apply to both feet and between toes twice daily 01/30/22   Gaynel Delon CROME, DPM  donepezil  (ARICEPT ) 10 MG tablet Take 1 tablet (10 mg total) by mouth at bedtime. 10/14/23   Plotnikov, Aleksei V, MD  memantine  (NAMENDA ) 10 MG tablet TAKE 1 TABLET TWICE DAILY 05/26/24   Plotnikov, Aleksei V, MD    Allergies: Tiazac  [diltiazem ]    Review of Systems  Neurological:  Positive for weakness.  All other systems reviewed and are  negative.   Updated Vital Signs BP (!) 145/73   Pulse (!) 53   Temp 98.7 F (37.1 C) (Oral)   Resp 16   SpO2 100%   Physical Exam Vitals and nursing note reviewed.  Constitutional:      Appearance: Normal appearance.  HENT:     Head: Normocephalic and atraumatic.     Right Ear: External ear normal.     Left Ear: External ear normal.     Nose: Nose normal.     Mouth/Throat:     Mouth: Mucous membranes are moist.     Pharynx: Oropharynx is clear.  Eyes:     Extraocular Movements: Extraocular movements intact.     Conjunctiva/sclera: Conjunctivae normal.     Pupils: Pupils are equal, round, and reactive to light.  Cardiovascular:     Rate and Rhythm: Normal rate and regular rhythm.     Pulses: Normal pulses.     Heart sounds: Normal heart sounds.  Pulmonary:     Effort: Pulmonary effort is normal.     Breath sounds: Normal breath sounds.  Abdominal:     General: Abdomen is flat. Bowel sounds are normal.     Palpations: Abdomen is soft.  Musculoskeletal:        General: Normal range of motion.     Cervical back: Normal range of  motion and neck supple.  Skin:    General: Skin is warm.     Capillary Refill: Capillary refill takes less than 2 seconds.  Neurological:     General: No focal deficit present.     Mental Status: She is alert. She is disoriented.  Psychiatric:        Mood and Affect: Mood normal.        Behavior: Behavior normal.        Thought Content: Thought content normal.        Judgment: Judgment normal.     (all labs ordered are listed, but only abnormal results are displayed) Labs Reviewed  CBC WITH DIFFERENTIAL/PLATELET - Abnormal; Notable for the following components:      Result Value   Monocytes Absolute 1.5 (*)    All other components within normal limits  COMPREHENSIVE METABOLIC PANEL WITH GFR - Abnormal; Notable for the following components:   Creatinine, Ser 1.17 (*)    GFR, Estimated 45 (*)    All other components within normal limits   URINALYSIS, W/ REFLEX TO CULTURE (INFECTION SUSPECTED) - Abnormal; Notable for the following components:   APPearance CLOUDY (*)    Hgb urine dipstick MODERATE (*)    Protein, ur 30 (*)    Leukocytes,Ua MODERATE (*)    Bacteria, UA MANY (*)    All other components within normal limits  CULTURE, BLOOD (ROUTINE X 2)  CULTURE, BLOOD (ROUTINE X 2)  CBG MONITORING, ED  I-STAT CG4 LACTIC ACID, ED    EKG: EKG Interpretation Date/Time:  Friday July 31 2024 21:32:15 EDT Ventricular Rate:  51 PR Interval:  217 QRS Duration:  93 QT Interval:  448 QTC Calculation: 413 R Axis:   -17  Text Interpretation: Sinus rhythm Borderline prolonged PR interval Borderline left axis deviation Low voltage, precordial leads Consider anterior infarct Since last tracing rate slower Confirmed by Dean Clarity [phone]) on 07/31/2024 10:09:25 PM  Radiology: DG Chest Portable 1 View Result Date: 07/31/2024 CLINICAL DATA:  Altered mental status and weakness. EXAM: PORTABLE CHEST 1 VIEW COMPARISON:  Chest x-ray 10/05/2012 FINDINGS: The heart is enlarged. There are atherosclerotic calcifications of the aorta. The lungs are clear. There is no pleural effusion or pneumothorax. No acute fractures are seen. IMPRESSION: Cardiomegaly. No acute pulmonary process. Electronically Signed   By: Greig Pique M.D.   On: 07/31/2024 21:50     Procedures   Medications Ordered in the ED  cefTRIAXone  (ROCEPHIN ) 1 g in sodium chloride  0.9 % 100 mL IVPB (has no administration in time range)  sodium chloride  0.9 % bolus 500 mL (500 mLs Intravenous New Bag/Given 07/31/24 2140)                                    Medical Decision Making Amount and/or Complexity of Data Reviewed Labs: ordered. Radiology: ordered.  Risk Decision regarding hospitalization.   This patient presents to the ED for concern of weakness, this involves an extensive number of treatment options, and is a complaint that carries with it a high risk of  complications and morbidity.  The differential diagnosis includes anemia, electrolyte abn, uti   Co morbidities that complicate the patient evaluation  hld, htn, arthritis and dementia   Additional history obtained:  Additional history obtained from epic chart review External records from outside source obtained and reviewed including sister/EMS report   Lab Tests:  I Ordered, and personally  interpreted labs.  The pertinent results include:  cbc nl, cmp nl other than cr 1.17 (1.02 in Nov); ua + for uti   Imaging Studies ordered:  I ordered imaging studies including cxr  I independently visualized and interpreted imaging which showed Cardiomegaly. No acute pulmonary process.  I agree with the radiologist interpretation   Cardiac Monitoring:  The patient was maintained on a cardiac monitor.  I personally viewed and interpreted the cardiac monitored which showed an underlying rhythm of: sb   Medicines ordered and prescription drug management:  I ordered medication including ivfs/iv rocephin   for sx  Reevaluation of the patient after these medicines showed that the patient improved I have reviewed the patients home medicines and have made adjustments as needed   Test Considered:  ct   Critical Interventions:  Ivfs/iv abx   Consultations Obtained:  I requested consultation with the hospitalist (Dr. Tobie),  and discussed lab and imaging findings as well as pertinent plan - he will admit   Problem List / ED Course:  UTI with weakness and confusion:  pt started on rocephin /ivfs.   Reevaluation:  After the interventions noted above, I reevaluated the patient and found that they have :improved   Social Determinants of Health:  Lives alone   Dispostion:  After consideration of the diagnostic results and the patients response to treatment, I feel that the patent would benefit from admission.       Final diagnoses:  Weakness  Dehydration  Acute  cystitis with hematuria  Metabolic encephalopathy    ED Discharge Orders     None          Dean Clarity, MD 07/31/24 2321

## 2024-07-31 NOTE — H&P (Signed)
 History and Physical    Deborah Myers FMW:[id number] DOB: 19-Feb-1936 DOA: 07/31/2024  PCP: Garald Karlynn GAILS, MD  Patient coming from: Home  I have personally briefly reviewed patient's old medical records in [facility]  Chief Complaint: Generalized weakness, poor oral intake  HPI: Deborah Myers is a 88 y.o. female with medical history significant for Alzheimer's dementia, CKD stage IIIa, HTN, HLD who presented to the ED for evaluation of generalized weakness, poor oral intake, and decreased level of activity.  ***  ED Course  Labs/Imaging on admission: I have personally reviewed following labs and imaging studies.  Initial vitals showed BP 145/73, pulse 53, RR 16, temp 98.7 F, SpO2 100% on room air.  Labs showed WBC 9.7, hemoglobin 13.6, platelets 278, sodium 142, potassium 4.2, bicarb 23, BUN 20, creatinine 1.17, serum glucose 98, LFTs within normal limits.  Urinalysis showed negative nitrites, moderate leukocytes, >50 RBCs and WBCs, many bacteria.  Blood cultures ordered and pending.  Portable chest x-ray negative for focal consolidation, edema, effusion.  Patient was given 500 cc normal saline and IV ceftriaxone .  The hospitalist service was consulted for admission.  Review of Systems: All systems reviewed and are negative except as documented in history of present illness above.   Past Medical History:  Diagnosis Date   Arthritis    Cataract    Cecum mass 2008   Benign    HTN (hypertension)    Hyperlipidemia    Osteoporosis    no per pt    Past Surgical History:  Procedure Laterality Date   COLECTOMY  2008   Benign mass - laparoscopic right   COLONOSCOPY      Social History: ***  Allergies  Allergen Reactions   Tiazac  [Diltiazem ]     edema    Family History  Problem Relation Age of Onset   Coronary artery disease Mother    Diabetes Mother    Heart disease Mother        CHF   Coronary artery disease Father    Diabetes Father     Heart disease Father    Diabetes Sister    Diabetes Brother    Cancer Neg Hx    COPD Neg Hx    Colon cancer Neg Hx    Esophageal cancer Neg Hx    Stomach cancer Neg Hx    Rectal cancer Neg Hx      Prior to Admission medications   Medication Sig Start Date End Date Taking? Authorizing Provider  amLODipine -valsartan  (EXFORGE ) 5-160 MG tablet Take 1 tablet by mouth daily. 10/14/23   Plotnikov, Aleksei V, MD  ampicillin  (PRINCIPEN) 500 MG capsule Take 1 capsule (500 mg total) by mouth 3 (three) times daily. 03/13/23   Plotnikov, Aleksei V, MD  atenolol  (TENORMIN ) 25 MG tablet TAKE 1 TABLET EVERY DAY 02/26/24   Plotnikov, Aleksei V, MD  b complex vitamins tablet Take 1 tablet by mouth daily. 10/22/18   Plotnikov, Karlynn GAILS, MD  cefUROXime  (CEFTIN ) 250 MG tablet Take 1 tablet (250 mg total) by mouth 2 (two) times daily with a meal. 10/15/23   Plotnikov, Karlynn GAILS, MD  Cholecalciferol (VITAMIN D3) 50 MCG (2000 UT) capsule Take 1 capsule (2,000 Units total) by mouth daily. 10/22/18   Plotnikov, Karlynn GAILS, MD  clotrimazole  (LOTRIMIN ) 1 % cream Apply to both feet and between toes twice daily 01/30/22   Gaynel Delon CROME, DPM  donepezil  (ARICEPT ) 10 MG tablet Take 1 tablet (10 mg total) by mouth at  bedtime. 10/14/23   Plotnikov, Karlynn GAILS, MD  memantine  (NAMENDA ) 10 MG tablet TAKE 1 TABLET TWICE DAILY 05/26/24   Plotnikov, Karlynn GAILS, MD    Physical Exam: Vitals:   07/31/24 2114  BP: (!) 145/73  Pulse: (!) 53  Resp: 16  Temp: 98.7 F (37.1 C)  TempSrc: Oral  SpO2: 100%   *** Constitutional: NAD, calm, comfortable Eyes: PERRL, lids and conjunctivae normal ENMT: Mucous membranes are moist. Posterior pharynx clear of any exudate or lesions.Normal dentition.  Neck: normal, supple, no masses. Respiratory: clear to auscultation bilaterally, no wheezing, no crackles. Normal respiratory effort. No accessory muscle use.  Cardiovascular: Regular rate and rhythm, no murmurs / rubs / gallops. No  extremity edema. 2+ pedal pulses. Abdomen: no tenderness, no masses palpated. Musculoskeletal: no clubbing / cyanosis. No joint deformity upper and lower extremities. Good ROM, no contractures. Normal muscle tone.  Skin: no rashes, lesions, ulcers. No induration Neurologic: Sensation intact. Strength 5/5 in all 4.  Psychiatric: Normal judgment and insight. Alert and oriented x 3. Normal mood.   EKG: Personally reviewed. Sinus bradycardia, rate 51, first-degree AV block, low voltage, no acute ischemic changes.  Assessment/Plan Active Problems:   * No active hospital problems. *   *** No notes on file *** Assessment and Plan: Urinary tract infection: ***  Generalized weakness/decreased level of activity likely due to UTI on background of Alzheimer's dementia: ***  Sinus bradycardia: Borderline bradycardia with HR 50s-60s.***  CKD stage IIIa: ***  Hypertension: ***    DVT prophylaxis: ***  Code Status: ***  Family Communication: ***  Disposition Plan: ***  Consults called: ***  Severity of Illness: The appropriate patient status for this patient is OBSERVATION. Observation status is judged to be reasonable and necessary in order to provide the required intensity of service to ensure the patient's safety. The patient's presenting symptoms, physical exam findings, and initial radiographic and laboratory data in the context of their medical condition is felt to place them at decreased risk for further clinical deterioration. Furthermore, it is anticipated that the patient will be medically stable for discharge from the hospital within 2 midnights of admission.   Jorie Blanch MD [facility]  If 7PM-7AM, please contact night-coverage [email or web address]  07/31/2024, 11:20 PM

## 2024-07-31 NOTE — ED Triage Notes (Signed)
 Pt BIB [facility] from home. Pt sister reports pt displaying increasing weakness with adls. Hx dementia. Baseline aox3. Pt lives alone.  146/84 62HR 96% RA 118

## 2024-08-01 ENCOUNTER — Encounter (HOSPITAL_COMMUNITY): Payer: Self-pay | Admitting: Internal Medicine

## 2024-08-01 DIAGNOSIS — F0280 Dementia in other diseases classified elsewhere without behavioral disturbance: Secondary | ICD-10-CM | POA: Diagnosis not present

## 2024-08-01 DIAGNOSIS — G309 Alzheimer's disease, unspecified: Secondary | ICD-10-CM | POA: Diagnosis not present

## 2024-08-01 DIAGNOSIS — N3000 Acute cystitis without hematuria: Secondary | ICD-10-CM | POA: Diagnosis not present

## 2024-08-01 DIAGNOSIS — N1831 Chronic kidney disease, stage 3a: Secondary | ICD-10-CM | POA: Diagnosis present

## 2024-08-01 LAB — BASIC METABOLIC PANEL WITH GFR
Anion gap: 14 (ref 5–15)
BUN: 17 mg/dL (ref 8–23)
CO2: 19 mmol/L — ABNORMAL LOW (ref 22–32)
Calcium: 9.7 mg/dL (ref 8.9–10.3)
Chloride: 107 mmol/L (ref 98–111)
Creatinine, Ser: 0.94 mg/dL (ref 0.44–1.00)
GFR, Estimated: 59 mL/min — ABNORMAL LOW (ref >60)
Glucose, Bld: 88 mg/dL (ref 70–99)
Potassium: 3.7 mmol/L (ref 3.5–5.1)
Sodium: 141 mmol/L (ref 135–145)

## 2024-08-01 LAB — CBC
HCT: 43 % (ref 36.0–46.0)
Hemoglobin: 12.8 g/dL (ref 12.0–15.0)
MCH: 27.5 pg (ref 26.0–34.0)
MCHC: 29.8 g/dL — ABNORMAL LOW (ref 30.0–36.0)
MCV: 92.5 fL (ref 80.0–100.0)
Platelets: 249 K/uL (ref 150–400)
RBC: 4.65 MIL/uL (ref 3.87–5.11)
RDW: 12.7 % (ref 11.5–15.5)
WBC: 10.5 K/uL (ref 4.0–10.5)
nRBC: 0 % (ref 0.0–0.2)

## 2024-08-01 MED ORDER — HYDRALAZINE HCL 20 MG/ML IJ SOLN: 10.0000 mg | INTRAMUSCULAR | Status: DC | PRN

## 2024-08-01 MED ORDER — SENNOSIDES-DOCUSATE SODIUM 8.6-50 MG PO TABS: 1.0000 | ORAL_TABLET | Freq: Every evening | ORAL | Status: DC | PRN

## 2024-08-01 MED ORDER — ACETAMINOPHEN 650 MG RE SUPP: 650.0000 mg | Freq: Four times a day (QID) | RECTAL | Status: DC | PRN

## 2024-08-01 MED ORDER — IRBESARTAN 150 MG PO TABS
150.0000 mg | ORAL_TABLET | Freq: Every day | ORAL | Status: DC
  Administered 2024-08-01 – 2024-08-02 (×2): 150 mg via ORAL
  Filled 2024-08-01 (×2): qty 1

## 2024-08-01 MED ORDER — INFLUENZA VAC SPLIT HIGH-DOSE 0.5 ML IM SUSY
0.5000 mL | PREFILLED_SYRINGE | INTRAMUSCULAR | Status: AC
  Administered 2024-08-02: 0.5 mL via INTRAMUSCULAR
  Filled 2024-08-01: qty 0.5

## 2024-08-01 MED ORDER — AMLODIPINE BESYLATE 5 MG PO TABS
5.0000 mg | ORAL_TABLET | Freq: Every day | ORAL | Status: DC
  Administered 2024-08-01 – 2024-08-02 (×2): 5 mg via ORAL
  Filled 2024-08-01 (×2): qty 1

## 2024-08-01 MED ORDER — ONDANSETRON HCL 4 MG/2ML IJ SOLN: 4.0000 mg | Freq: Four times a day (QID) | INTRAMUSCULAR | Status: DC | PRN

## 2024-08-01 MED ORDER — ENOXAPARIN SODIUM 40 MG/0.4ML IJ SOSY
40.0000 mg | PREFILLED_SYRINGE | INTRAMUSCULAR | Status: DC
  Administered 2024-08-01 – 2024-08-02 (×2): 40 mg via SUBCUTANEOUS
  Filled 2024-08-01 (×2): qty 0.4

## 2024-08-01 MED ORDER — MEMANTINE HCL 10 MG PO TABS
10.0000 mg | ORAL_TABLET | Freq: Two times a day (BID) | ORAL | Status: DC
  Administered 2024-08-01 – 2024-08-02 (×4): 10 mg via ORAL
  Filled 2024-08-01 (×4): qty 1

## 2024-08-01 MED ORDER — AMLODIPINE BESYLATE-VALSARTAN 5-160 MG PO TABS: 1.0000 | ORAL_TABLET | Freq: Every day | ORAL | Status: DC

## 2024-08-01 MED ORDER — CEFTRIAXONE SODIUM 1 G IJ SOLR
1.0000 g | INTRAMUSCULAR | Status: DC
  Administered 2024-08-01: 1 g via INTRAVENOUS
  Filled 2024-08-01: qty 10

## 2024-08-01 MED ORDER — DONEPEZIL HCL 10 MG PO TABS
10.0000 mg | ORAL_TABLET | Freq: Every day | ORAL | Status: DC
  Administered 2024-08-01 (×2): 10 mg via ORAL
  Filled 2024-08-01 (×2): qty 1

## 2024-08-01 MED ORDER — ONDANSETRON HCL 4 MG PO TABS: 4.0000 mg | ORAL_TABLET | Freq: Four times a day (QID) | ORAL | Status: DC | PRN

## 2024-08-01 MED ORDER — ACETAMINOPHEN 325 MG PO TABS
650.0000 mg | ORAL_TABLET | Freq: Four times a day (QID) | ORAL | Status: DC | PRN
  Administered 2024-08-01: 650 mg via ORAL
  Filled 2024-08-01: qty 2

## 2024-08-01 MED ORDER — LACTATED RINGERS IV SOLN: INTRAVENOUS | Status: AC

## 2024-08-01 NOTE — Hospital Course (Addendum)
 Deborah Myers is a 88 y.o. female with medical history significant for Alzheimer's dementia, CKD stage IIIa, HTN, HLD who presented with decreased appetite and level of activity presumed due to UTI.

## 2024-08-01 NOTE — Progress Notes (Signed)
 PROGRESS NOTE    Deborah Myers  FMW:[id number] DOB: 08-13-1936 DOA: 07/31/2024 PCP: Garald Karlynn GAILS, MD    Brief Narrative:  88 year old with history of Alzheimer's disease, CKD stage IIIa, hypertension hyperlipidemia presented with generalized weakness, poor oral intake and decreased level of activity.  Lives alone.  Sister lives nearby.  In the emergency room hemodynamically stable.  She had no complaints herself.  Confused.  Blood pressure stable.  Urinalysis abnormal.  Admitted for symptomatic management.  Subjective: Patient seen and examined in the morning rounds.  No overnight events.  She is very pleasant but confused.  She thinks she lives with her mother. Assessment & Plan:   Acute UTI present on admission Infective encephalopathy in a patient with dementia  Clinically stabilizing.  Continue Rocephin  today due to significant symptoms.  Will continue IV antibiotics until final urine cultures and blood cultures available. No focal neurological deficits.  Continue supportive care.  Delirium precautions.  Fall precautions.  PT and OT.  Continue Aricept  and Namenda .  Sinus bradycardia: Noted sinus bradycardia.  Heart rate remains more than 50.  Can go back on atenolol .  CKD stage IIIa: Fairly stable at baseline.  Essential hypertension: Stable on amlodipine  and valsartan .    DVT prophylaxis: enoxaparin  (LOVENOX ) injection 40 mg Start: 08/01/24 1000   Code Status: Full code Family Communication: Sister called but unable to reach Disposition Plan: Status is: Observation The patient will require care spanning > 2 midnights and should be moved to inpatient because: IV antibiotics     Consultants:  None  Procedures:  None  Antimicrobials:  Rocephin  9/12---     Objective: Vitals:   08/01/24 0016 08/01/24 0041 08/01/24 0113 08/01/24 0525  BP: (!) 194/74 (!) 158/70  (!) 136/100  Pulse:  (!) 55  (!) 59  Resp:  17  18  Temp: 98.5 F (36.9 C) 98 F (36.7  C)  99 F (37.2 C)  TempSrc: Oral Oral  Oral  SpO2:  100%  98%  Weight:   81.3 kg   Height:   5' 6 (1.676 m)     Intake/Output Summary (Last 24 hours) at 08/01/2024 1359 Last data filed at 08/01/2024 1215 Gross per 24 hour  Intake 859.61 ml  Output 600 ml  Net 259.61 ml   Filed Weights   08/01/24 0113  Weight: 81.3 kg    Examination:  General exam: Appears calm and comfortable.  Pleasant interactive. Respiratory system: Clear to auscultation. Respiratory effort normal. Cardiovascular system: S1 & S2 heard, RRR. No JVD, murmurs, rubs, gallops or clicks. No pedal edema. Gastrointestinal system: Abdomen is nondistended, soft and nontender. No organomegaly or masses felt. Normal bowel sounds heard. Central nervous system: Alert and awake.  She is oriented to herself.  Not oriented to time place and person. Extremities: Symmetric 5 x 5 power.    Data Reviewed: I have personally reviewed following labs and imaging studies  CBC: Recent Labs  Lab 07/31/24 2224 08/01/24 0343  WBC 9.7 10.5  NEUTROABS 6.4  --   HGB 13.6 12.8  HCT 44.8 43.0  MCV 89.6 92.5  PLT 278 249   Basic Metabolic Panel: Recent Labs  Lab 07/31/24 2224 08/01/24 0343  NA 142 141  K 4.2 3.7  CL 106 107  CO2 23 19*  GLUCOSE 98 88  BUN 20 17  CREATININE 1.17* 0.94  CALCIUM 10.1 9.7   GFR: Estimated Creatinine Clearance: 46.2 mL/min (by C-G formula based on SCr of 0.94 mg/dL). Liver Function  Tests: Recent Labs  Lab 07/31/24 2224  AST 15  ALT 7  ALKPHOS 77  BILITOT 0.9  PROT 7.7  ALBUMIN  3.9   No results for input(s): LIPASE, AMYLASE in the last 168 hours. No results for input(s): AMMONIA in the last 168 hours. Coagulation Profile: No results for input(s): INR, PROTIME in the last 168 hours. Cardiac Enzymes: No results for input(s): CKTOTAL, CKMB, CKMBINDEX, TROPONINI in the last 168 hours. BNP (last 3 results) No results for input(s): PROBNP in the last 8760  hours. HbA1C: No results for input(s): HGBA1C in the last 72 hours. CBG: Recent Labs  Lab 07/31/24 2133  GLUCAP 92   Lipid Profile: No results for input(s): CHOL, HDL, LDLCALC, TRIG, CHOLHDL, LDLDIRECT in the last 72 hours. Thyroid  Function Tests: No results for input(s): TSH, T4TOTAL, FREET4, T3FREE, THYROIDAB in the last 72 hours. Anemia Panel: No results for input(s): VITAMINB12, FOLATE, FERRITIN, TIBC, IRON, RETICCTPCT in the last 72 hours. Sepsis Labs: Recent Labs  Lab 07/31/24 2329  LATICACIDVEN 1.3    Recent Results (from the past 240 hours)  Culture, blood (routine x 2)     Status: None (Preliminary result)   Collection Time: 07/31/24 11:15 PM   Specimen: BLOOD  Result Value Ref Range Status   Specimen Description   Final    BLOOD LEFT ANTECUBITAL Performed at [facility], [address] [place], [place] [address]    Special Requests   Final    BOTTLES DRAWN AEROBIC AND ANAEROBIC Blood Culture adequate volume Performed at [facility], [address] [place], [place] [address]    Culture   Final    NO GROWTH < 12 HOURS Performed at [facility], [address], [place], [place] [address]    Report Status PENDING  Incomplete  Culture, blood (routine x 2)     Status: None (Preliminary result)   Collection Time: 07/31/24 11:15 PM   Specimen: BLOOD  Result Value Ref Range Status   Specimen Description   Final    BLOOD RIGHT ANTECUBITAL Performed at [facility], [address], [place], [place] [address]    Special Requests   Final    BOTTLES DRAWN AEROBIC AND ANAEROBIC Blood Culture adequate volume Performed at [facility], [address], [place], [place] [address]    Culture   Final    NO GROWTH < 12 HOURS Performed at [facility], [address], [place], [place] [address]    Report Status PENDING  Incomplete          Radiology Studies: DG Chest Portable 1 View Result Date: 07/31/2024 CLINICAL DATA:  Altered mental status and weakness. EXAM: PORTABLE CHEST 1 VIEW COMPARISON:  Chest x-ray 10/05/2012 FINDINGS: The heart is enlarged. There are atherosclerotic calcifications of the aorta. The lungs are clear. There is no pleural effusion or pneumothorax. No acute fractures are seen. IMPRESSION: Cardiomegaly. No acute pulmonary process. Electronically Signed   By: Greig Pique M.D.   On: 07/31/2024 21:50        Scheduled Meds:  amLODipine   5 mg Oral Daily   And   irbesartan   150 mg Oral Daily   donepezil   10 mg Oral QHS   enoxaparin  (LOVENOX ) injection  40 mg Subcutaneous Q24H   [START ON 08/02/2024] Influenza vac split trivalent PF  0.5 mL Intramuscular Tomorrow-1000   memantine   10 mg Oral BID   Continuous Infusions:  cefTRIAXone  (ROCEPHIN )  IV  LOS: 0 days    Time spent: 45 minutes    Renato Applebaum, MD [facility]

## 2024-08-01 NOTE — Evaluation (Signed)
 Occupational Therapy Evaluation Patient Details Name: Deborah Myers MRN: [id number] DOB: October 27, 1936 Today's Date: 08/01/2024   History of Present Illness   88 y.o. female who presented to the ED for evaluation of generalized weakness, poor oral intake, and decreased level of activity, admitted with UTI. medical history significant for Alzheimer's dementia, CKD stage IIIa, HTN, HLD     Clinical Impressions Pt presents with decline in function and safety with ADLs and ADL mobility with impaired strength, balance, endurance and hx of cognitive impairments (pt is a poor historian). PTA pt lives with her sister as primary caregiver and per family/caregiver pt is set up assist with UB ADLs, grooming and mod A with LB ADLs, assist with meals and showers at baseline, Ind with toileting, furniture walks Ind at her baseline. Pt currently requires CGA with UB ADLs, max A with LB ADLs, mod/min A with STS/SPTs and mod A with toileting tasks. OT will follow acutely to maximize level of function and safety   If plan is discharge home, recommend the following:   A lot of help with bathing/dressing/bathroom;A lot of help with walking and/or transfers;Assistance with cooking/housework;Direct supervision/assist for financial management;Supervision due to cognitive status;Assist for transportation;Direct supervision/assist for medications management;Help with stairs or ramp for entrance     Functional Status Assessment         Equipment Recommendations   None recommended by OT     Recommendations for Other Services         Precautions/Restrictions   Precautions Precautions: Fall Restrictions Weight Bearing Restrictions Per Provider Order: No     Mobility Bed Mobility               General bed mobility comments: pt in chair    Transfers Overall transfer level: Needs assistance Equipment used: Rolling walker (2 wheels) Transfers: Sit to/from Stand, Bed to  chair/wheelchair/BSC Sit to Stand: Mod assist, Min assist     Step pivot transfers: Min assist     General transfer comment: multi-modal cues for sequence, hand placement. assist to rise and stabilize instanding; cues and assist to manage RW for SPT      Balance Overall balance assessment: Needs assistance Sitting-balance support: No upper extremity supported, Feet unsupported Sitting balance-Leahy Scale: Good     Standing balance support: Reliant on assistive device for balance, During functional activity, Bilateral upper extremity supported Standing balance-Leahy Scale: Poor                             ADL either performed or assessed with clinical judgement   ADL Overall ADL's : Needs assistance/impaired Eating/Feeding: Set up;Independent;Sitting   Grooming: Wash/dry hands;Wash/dry face;Minimal assistance;Standing   Upper Body Bathing: Contact guard assist   Lower Body Bathing: Maximal assistance   Upper Body Dressing : Contact guard assist   Lower Body Dressing: Maximal assistance   Toilet Transfer: Moderate assistance;Minimal assistance;Rolling walker (2 wheels);Stand-pivot;Cueing for safety;Cueing for sequencing   Toileting- Clothing Manipulation and Hygiene: Moderate assistance;Sitting/lateral lean;Sit to/from stand       Functional mobility during ADLs: Moderate assistance;Minimal assistance;Rolling walker (2 wheels);Cueing for safety;Cueing for sequencing General ADL Comments: Per family/caregiver pt is set up assist with UB ADLs, grooming and mod A with LB ADLs. Assist with showers at baseline, Ind with toileting     Vision Ability to See in Adequate Light: 0 Adequate Patient Visual Report: No change from baseline       Perception  Praxis         Pertinent Vitals/Pain Pain Assessment Pain Assessment: No/denies pain     Extremity/Trunk Assessment Upper Extremity Assessment Upper Extremity Assessment: Overall WFL for tasks  assessed   Lower Extremity Assessment Lower Extremity Assessment: Defer to PT evaluation       Communication     Cognition Arousal: Alert Behavior During Therapy: WFL for tasks assessed/performed Cognition: History of cognitive impairments, Cognition impaired   Orientation impairments: Place, Situation   Memory impairment (select all impairments): Short-term memory   Executive functioning impairment (select all impairments): Reasoning, Problem solving                   Following commands: Intact, Impaired Following commands impaired: Follows one step commands with increased time     Cueing  General Comments   Cueing Techniques: Verbal cues;Gestural cues      Exercises     Shoulder Instructions      Home Living Family/patient expects to be discharged to:: Private residence Living Arrangements: Other relatives Available Help at Discharge: Family Type of Home: House       Home Layout: One level     Bathroom Shower/Tub: Chief Strategy Officer: Standard     Home Equipment: Information systems manager   Additional Comments: family present and reports that pt furniture walks      Prior Functioning/Environment Prior Level of Function : Needs assist             Mobility Comments: furniture walks per family/caregiver ADLs Comments: Per family/caregiver pt is set up assist with UB ADLs, grooming and mod A with LB ADLs. Assist with showers, Ind with toileting, assist with meals    OT Problem List: Decreased strength;Impaired balance (sitting and/or standing);Decreased cognition;Decreased safety awareness;Decreased activity tolerance;Decreased knowledge of use of DME or AE   OT Treatment/Interventions: Self-care/ADL training;Therapeutic exercise;Patient/family education;Balance training;Therapeutic activities;DME and/or AE instruction      OT Goals(Current goals can be found in the care plan section)   Acute Rehab OT Goals Patient Stated Goal: go  home OT Goal Formulation: With patient/family Time For Goal Achievement: 08/15/24 Potential to Achieve Goals: Good ADL Goals Pt Will Perform Grooming: with contact guard assist;with supervision Pt Will Perform Upper Body Bathing: with supervision;with set-up;sitting;with caregiver independent in assisting Pt Will Perform Upper Body Dressing: with supervision;with set-up;sitting;with caregiver independent in assisting Pt Will Transfer to Toilet: with min assist;with contact guard assist;ambulating Pt Will Perform Toileting - Clothing Manipulation and hygiene: with min assist;sitting/lateral leans;sit to/from stand;with caregiver independent in assisting   OT Frequency:  Min 2X/week    Co-evaluation              AM-PAC OT 6 Clicks Daily Activity     Outcome Measure Help from another person eating meals?: A Little Help from another person taking care of personal grooming?: A Little Help from another person toileting, which includes using toliet, bedpan, or urinal?: A Lot Help from another person bathing (including washing, rinsing, drying)?: A Lot Help from another person to put on and taking off regular upper body clothing?: A Little Help from another person to put on and taking off regular lower body clothing?: A Lot 6 Click Score: 15   End of Session Equipment Utilized During Treatment: Gait belt;Rolling walker (2 wheels) Nurse Communication: Mobility status  Activity Tolerance: Patient tolerated treatment well Patient left: in chair;with call bell/phone within reach;with chair alarm set;with family/visitor present  OT Visit Diagnosis: Unsteadiness on feet (R26.81);Other  abnormalities of gait and mobility (R26.89);Muscle weakness (generalized) (M62.81);Other symptoms and signs involving cognitive function                Time: 8563-8542 OT Time Calculation (min): 21 min Charges:  OT General Charges $OT Visit: 1 Visit OT Evaluation $OT Eval Moderate Complexity: 1  Mod    Jacques Karna Loose 08/01/2024, 3:09 PM

## 2024-08-01 NOTE — Care Management Obs Status (Signed)
 MEDICARE OBSERVATION STATUS NOTIFICATION   Patient Details  Name: Deborah Myers MRN: [id number] Date of Birth: 05-Aug-1936   Medicare Observation Status Notification Given:  Yes    Sheri ONEIDA Sharps, LCSW 08/01/2024, 4:02 PM

## 2024-08-01 NOTE — Evaluation (Signed)
 Physical Therapy Evaluation Patient Details Name: Deborah Myers MRN: [id number] DOB: October 12, 1936 Today's Date: 08/01/2024  History of Present Illness  88 y.o. female who presented to the ED for evaluation of generalized weakness, poor oral intake, and decreased level of activity, admitted with UTI. medical history significant for Alzheimer's dementia, CKD stage IIIa, HTN, HLD  Clinical Impression  Pt admitted with above diagnosis.  Pt reports she is ind with ambulation at her baseline, no family present at time of eval; per chart appears she lives with sister-? Pt min assist for STS and SPT today. May benefit from HHPT at d/c  Pt currently with functional limitations due to the deficits listed below (see PT Problem List). Pt will benefit from acute skilled PT to increase their independence and safety with mobility to allow discharge.           If plan is discharge home, recommend the following: A little help with bathing/dressing/bathroom;Assist for transportation;Help with stairs or ramp for entrance;Assistance with cooking/housework;Supervision due to cognitive status;Direct supervision/assist for financial management   Can travel by private vehicle        Equipment Recommendations Other (comment) (TBD)  Recommendations for Other Services       Functional Status Assessment Patient has had a recent decline in their functional status and demonstrates the ability to make significant improvements in function in a reasonable and predictable amount of time.     Precautions / Restrictions Precautions Precautions: Fall      Mobility  Bed Mobility Overal bed mobility: Needs Assistance Bed Mobility: Supine to Sit     Supine to sit: HOB elevated, Min assist, Mod assist     General bed mobility comments: incr time, assist to elevate trunk and progress LEs off bed; multi-modal cues to initiate movement    Transfers Overall transfer level: Needs assistance Equipment used:  Rolling walker (2 wheels) Transfers: Sit to/from Stand, Bed to chair/wheelchair/BSC Sit to Stand: Min assist, +2 safety/equipment   Step pivot transfers: Min assist, +2 safety/equipment       General transfer comment: multi-modal cues for sequence, hand placement. assist to rise and stabilize instanding; cues and assist to manage RW for SPT    Ambulation/Gait                  Stairs            Wheelchair Mobility     Tilt Bed    Modified Rankin (Stroke Patients Only)       Balance Overall balance assessment: Needs assistance Sitting-balance support: No upper extremity supported, Feet unsupported Sitting balance-Leahy Scale: Fair Sitting balance - Comments: at least Fair, not challenged   Standing balance support: Reliant on assistive device for balance, During functional activity, Bilateral upper extremity supported Standing balance-Leahy Scale: Poor                               Pertinent Vitals/Pain Pain Assessment Pain Assessment: No/denies pain    Home Living Family/patient expects to be discharged to:: Private residence Living Arrangements: Other relatives                 Additional Comments: family not present at time of PT eval; ?-lives with sister    Prior Function Prior Level of Function : Patient poor historian/Family not available             Mobility Comments: pt reports she amb without device  Extremity/Trunk Assessment   Upper Extremity Assessment Upper Extremity Assessment: Defer to OT evaluation    Lower Extremity Assessment Lower Extremity Assessment: Overall WFL for tasks assessed       Communication        Cognition Arousal: Alert Behavior During Therapy: WFL for tasks assessed/performed   PT - Cognitive impairments: History of cognitive impairments                         Following commands: Intact, Impaired Following commands impaired: Follows one step commands with  increased time     Cueing Cueing Techniques: Verbal cues, Gestural cues     General Comments      Exercises     Assessment/Plan    PT Assessment Patient needs continued PT services  PT Problem List Decreased mobility;Decreased activity tolerance;Decreased cognition       PT Treatment Interventions Therapeutic activities;Gait training;Functional mobility training;Therapeutic exercise;Patient/family education    PT Goals (Current goals can be found in the Care Plan section)  Acute Rehab PT Goals PT Goal Formulation: Patient unable to participate in goal setting Time For Goal Achievement: 08/15/24 Potential to Achieve Goals: Good    Frequency Min 2X/week     Co-evaluation               AM-PAC PT 6 Clicks Mobility  Outcome Measure Help needed turning from your back to your side while in a flat bed without using bedrails?: A Little Help needed moving from lying on your back to sitting on the side of a flat bed without using bedrails?: A Little Help needed moving to and from a bed to a chair (including a wheelchair)?: A Little Help needed standing up from a chair using your arms (e.g., wheelchair or bedside chair)?: A Little Help needed to walk in hospital room?: A Little Help needed climbing 3-5 steps with a railing? : A Lot 6 Click Score: 17    End of Session Equipment Utilized During Treatment: Gait belt Activity Tolerance: Patient tolerated treatment well Patient left: with call bell/phone within reach;in chair;with chair alarm set Nurse Communication: Mobility status PT Visit Diagnosis: Other abnormalities of gait and mobility (R26.89)    Time: 8954-8943 PT Time Calculation (min) (ACUTE ONLY): 11 min   Charges:   PT Evaluation $PT Eval Low Complexity: 1 Low   PT General Charges $$ ACUTE PT VISIT: 1 Visit         Lidiya Reise, PT  Acute Rehab Dept [facility]) [phone]  08/01/2024   [facility] 08/01/2024, 12:13 PM

## 2024-08-02 ENCOUNTER — Other Ambulatory Visit (HOSPITAL_COMMUNITY): Payer: Self-pay

## 2024-08-02 DIAGNOSIS — G309 Alzheimer's disease, unspecified: Secondary | ICD-10-CM | POA: Diagnosis not present

## 2024-08-02 DIAGNOSIS — F0280 Dementia in other diseases classified elsewhere without behavioral disturbance: Secondary | ICD-10-CM | POA: Diagnosis not present

## 2024-08-02 DIAGNOSIS — N3000 Acute cystitis without hematuria: Secondary | ICD-10-CM | POA: Diagnosis not present

## 2024-08-02 MED ORDER — CEPHALEXIN 500 MG PO CAPS
500.0000 mg | ORAL_CAPSULE | Freq: Three times a day (TID) | ORAL | 0 refills | Status: AC
  Filled 2024-08-02: qty 12, 4d supply, fill #0

## 2024-08-02 MED ORDER — CEFTRIAXONE SODIUM 1 G IJ SOLR
1.0000 g | Freq: Once | INTRAMUSCULAR | Status: AC
  Administered 2024-08-02: 1 g via INTRAVENOUS
  Filled 2024-08-02: qty 10

## 2024-08-02 NOTE — Plan of Care (Signed)
   Problem: Coping: Goal: Level of anxiety will decrease Outcome: Progressing

## 2024-08-02 NOTE — TOC Initial Note (Signed)
 Transition of Care [facility]) - Initial/Assessment Note    Patient Details  Name: Deborah Myers MRN: [id number] Date of Birth: [birth year]  Transition of [facility]) CM/SW Contact:    Sonda Manuella Quill, RN Phone Number: 08/02/2024, 12:45 PM  Clinical Narrative:                 Orders received for HHPT/OT; spoke w/ pt and sister Skyanne Welle ([phone]) in room; info obtained from sister; she said is pt's primary care giver; pt is from home and will return at d/c; she verified insurance/PCP; she denied pt experiencing SDOH risks; pt has cane, and walker; she does not have HH services or home oxygen; she agreed to HHPT/OT for pt; Ms Leatherwood said she does not want services from [place]; referral given to [employer] at [place]; she said agency can provide service; pt's sister notified; agency contact info placed in follow up provider section of d/c instructions; no TOC needs.  Expected Discharge Plan: Home w Home Health Services Barriers to Discharge: No Barriers Identified   Patient Goals and CMS Choice Patient states their goals for this hospitalization and ongoing recovery are:: home CMS Medicare.gov Compare Post Acute Care list provided to:: Patient Represenative (must comment) Jules Mau (sister))        Expected Discharge Plan and Services   Discharge Planning Services: CM Consult   Living arrangements for the past 2 months: Single Family Home                 DME Arranged: N/A DME Agency: NA       HH Arranged: PT, OT HH Agency: [facility]   Time HH Agency Contacted: [phone] Representative spoke with at [facility] Agency: Amy Hyatt  Prior Living Arrangements/Services Living arrangements for the past 2 months: Single Family Home Lives with:: Self Patient language and need for interpreter reviewed:: Yes Do you feel safe going back to the place where you live?: Yes      Need for Family Participation in Patient Care: Yes (Comment) Care giver support system in  place?: Yes (comment) Current home services: DME (cane, walker) Criminal Activity/Legal Involvement Pertinent to Current Situation/Hospitalization: No - Comment as needed  Activities of Daily Living   ADL Screening (condition at time of admission) Independently performs ADLs?: No Does the patient have a NEW difficulty with bathing/dressing/toileting/self-feeding that is expected to last >3 days?: Yes (Initiates electronic notice to provider for possible OT consult) Does the patient have a NEW difficulty with getting in/out of bed, walking, or climbing stairs that is expected to last >3 days?: Yes (Initiates electronic notice to provider for possible PT consult) Does the patient have a NEW difficulty with communication that is expected to last >3 days?: No Is the patient deaf or have difficulty hearing?: No Does the patient have difficulty seeing, even when wearing glasses/contacts?: No Does the patient have difficulty concentrating, remembering, or making decisions?: Yes  Permission Sought/Granted Permission sought to share information with : Case Manager Permission granted to share information with : Yes, Verbal Permission Granted  Share Information with NAME: Case Manager     Permission granted to share info w Relationship: Iria Jamerson (sister) [phone]     Emotional Assessment Appearance:: Appears stated age Attitude/Demeanor/Rapport: Unable to Assess Affect (typically observed): Unable to Assess Orientation: :  (unable to assess) Alcohol / Substance Use: Not Applicable Psych Involvement: No (comment)  Admission diagnosis:  Metabolic encephalopathy [G93.41] Dehydration [E86.0] Weakness [R53.1] Urinary tract infection [N39.0] Acute cystitis  with hematuria [N30.01] Patient Active Problem List   Diagnosis Date Noted   Chronic kidney disease, stage 3a (HCC) 08/01/2024   Pain due to onychomycosis of toenails of both feet 12/31/2023   Knee contusion 03/05/2022   CRF  (chronic renal failure), stage 3 (moderate) (HCC) 01/05/2021   Weight gain 09/28/2019   Abnormal TSH 05/26/2019   Urinary tract infection 05/09/2018   Alzheimer disease (HCC) 04/01/2018   Insomnia 09/11/2016   Arthritis of right lower extremity 03/10/2015   Edema 12/08/2014   Pain in joint, lower leg 01/04/2014   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 [place], [place] - [address] [place] [address] Phone: [phone] Fax: [phone]     Social Drivers of Health (SDOH) Social History: SDOH Screenings   Food Insecurity: No Food Insecurity (08/02/2024)  Housing: Low Risk  (08/02/2024)  Transportation Needs: No Transportation Needs (08/02/2024)  Utilities: Not At Risk (08/02/2024)  Alcohol Screen: Low Risk  (12/16/2023)  Depression (PHQ2-9): Low Risk  (07/11/2023)  Financial Resource Strain: Low Risk  (12/16/2023)  Physical Activity: Inactive (12/16/2023)  Social Connections: Patient Unable To Answer (08/01/2024)  Stress: Patient Unable To Answer (12/16/2023)  Tobacco Use: Low Risk  (08/01/2024)  Health Literacy: Inadequate Health Literacy (12/16/2023)   SDOH Interventions: Food Insecurity Interventions: Intervention Not Indicated, Inpatient TOC Housing Interventions: Intervention Not Indicated, Inpatient TOC Transportation Interventions: Intervention Not Indicated, Inpatient TOC Utilities Interventions: Intervention Not Indicated, Inpatient  TOC   Readmission Risk Interventions     No data to display

## 2024-08-02 NOTE — Plan of Care (Signed)
  Problem: Coping: Goal: Level of anxiety will decrease 08/02/2024 0707 by Brien Marylynn DEL, RN Outcome: Progressing 08/02/2024 0707 by Brien Marylynn DEL, RN Outcome: Progressing

## 2024-08-02 NOTE — Discharge Summary (Signed)
 Physician Discharge Summary  Deborah Myers FMW:[id number] DOB: 02/03/1936 DOA: 07/31/2024  PCP: Garald Karlynn GAILS, MD  Admit date: 07/31/2024 Discharge date: 08/02/2024  Admitted From: Home Disposition: Home with home health  Recommendations for Outpatient Follow-up:  Follow up with PCP in 1-2 weeks Please obtain BMP/CBC in one week Please follow up on the following pending results: Final urine cultures  Home Health: PT/OT Equipment/Devices: Available at home  Discharge Condition: Stable CODE STATUS: Full code Diet recommendation: Low-salt diet  Discharge summary: 88 year old with history of Alzheimer's disease, CKD stage IIIa, hypertension hyperlipidemia presented with generalized weakness, poor oral intake and decreased level of activity. Lives alone. Sister lives nearby. In the emergency room hemodynamically stable. She had no complaints herself. Confused .  Urine was abnormal.  Admitted and treated with antibiotics.  Urine culture with E. coli.  Acute UTI present on admission secondary to E. coli.  Infective encephalopathy in a patient with baseline dementia.  She was in the hospital.  Treated with Rocephin  x 3 today.  Clinically improved.  Urine culture with E. coli, final sensitivity pending.  Previous E. coli sensitive to cephalosporins.  No focal neurological deficits.  With good clinical improvement, going home today with Keflex  500 mg 3 times daily for additional 4 days to complete 7 days of therapy.  Chronic medical issues including Hypertension, amlodipine  valsartan  and atenolol  to continue. CKD stage IIIa, at about baseline. Dementia, lives at home.  Sister primary caretaker.  She will benefit with home health PT OT.  Stable to transition home today.  On oral antibiotics.  Discharge Diagnoses:  Principal Problem:   Urinary tract infection Active Problems:   Essential hypertension   Alzheimer disease (HCC)   Chronic kidney disease, stage 3a  [facility])    Discharge Instructions  Discharge Instructions     Diet - low sodium heart healthy   Complete by: As directed    Increase activity slowly   Complete by: As directed       Allergies as of 08/02/2024       Reactions   Tiazac  [diltiazem ]    edema        Medication List     TAKE these medications    amLODipine -valsartan  5-160 MG tablet Commonly known as: EXFORGE  Take 1 tablet by mouth daily.   atenolol  25 MG tablet Commonly known as: TENORMIN  TAKE 1 TABLET EVERY DAY   b complex vitamins tablet Take 1 tablet by mouth daily.   cephALEXin  500 MG capsule Commonly known as: KEFLEX  Take 1 capsule (500 mg total) by mouth 3 (three) times daily for 4 days.   donepezil  10 MG tablet Commonly known as: ARICEPT  Take 1 tablet (10 mg total) by mouth at bedtime.   memantine  10 MG tablet Commonly known as: NAMENDA  TAKE 1 TABLET TWICE DAILY   Vitamin D3 50 MCG (2000 UT) capsule Take 1 capsule (2,000 Units total) by mouth daily.        Follow-up Information     [facility] Follow up.   Contact information: [address] [place] [address] [phone]                Allergies  Allergen Reactions   Tiazac  [Diltiazem ]     edema    Consultations: None   Procedures/Studies: DG Chest Portable 1 View Result Date: 07/31/2024 CLINICAL DATA:  Altered mental status and weakness. EXAM: PORTABLE CHEST 1 VIEW COMPARISON:  Chest x-ray 10/05/2012 FINDINGS: The heart is  enlarged. There are atherosclerotic calcifications of the aorta. The lungs are clear. There is no pleural effusion or pneumothorax. No acute fractures are seen. IMPRESSION: Cardiomegaly. No acute pulmonary process. Electronically Signed   By: Greig Pique M.D.   On: 07/31/2024 21:50   (Echo, Carotid, EGD, Colonoscopy, ERCP)    Subjective: Seen in the morning rounds.  Denies any complaints.  Very pleasant and interactive but she has forgetfulness.  She knows she  is in the hospital.  She does not know why she is in the hospital.  Oriented to herself and her sister.  Not oriented to time place and person.   Discharge Exam: Vitals:   08/01/24 2119 08/02/24 0507  BP: 96/63 (!) 162/84  Pulse: (!) 56 65  Resp: 18 17  Temp: 99.4 F (37.4 C) 98.1 F (36.7 C)  SpO2: 98% 100%   Vitals:   08/01/24 1400 08/01/24 1834 08/01/24 2119 08/02/24 0507  BP: (!) 158/87 (!) 146/67 96/63 (!) 162/84  Pulse: 63 66 (!) 56 65  Resp: 15 17 18 17   Temp: 99.3 F (37.4 C) 98.9 F (37.2 C) 99.4 F (37.4 C) 98.1 F (36.7 C)  TempSrc:  Oral Oral Oral  SpO2: 96% 100% 98% 100%  Weight:      Height:        General: Pt is alert, awake, not in acute distress Pleasant but confused. Cardiovascular: RRR, S1/S2 +, no rubs, no gallops Respiratory: CTA bilaterally, no wheezing, no rhonchi Abdominal: Soft, NT, ND, bowel sounds + Extremities: no edema, no cyanosis    The results of significant diagnostics from this hospitalization (including imaging, microbiology, ancillary and laboratory) are listed below for reference.     Microbiology: Recent Results (from the past 240 hours)  Culture, blood (routine x 2)     Status: None (Preliminary result)   Collection Time: 07/31/24 11:15 PM   Specimen: BLOOD  Result Value Ref Range Status   Specimen Description   Final    BLOOD LEFT ANTECUBITAL Performed at [facility], [address] [place], [place] [address]    Special Requests   Final    BOTTLES DRAWN AEROBIC AND ANAEROBIC Blood Culture adequate volume Performed at [facility], [address] [place], [place] [address]    Culture   Final    NO GROWTH 1 DAY Performed at [facility], [address], [place], [place] [address]    Report Status PENDING  Incomplete  Culture, blood (routine x 2)     Status: None (Preliminary result)   Collection Time: 07/31/24 11:15 PM   Specimen: BLOOD  Result Value Ref Range Status    Specimen Description   Final    BLOOD RIGHT ANTECUBITAL Performed at [facility], [address] [place], [place] [address]    Special Requests   Final    BOTTLES DRAWN AEROBIC AND ANAEROBIC Blood Culture adequate volume Performed at [facility], [address] [place], [place] [address]    Culture   Final    NO GROWTH 1 DAY Performed at [facility], [address], [place], [place] [address]    Report Status PENDING  Incomplete  Urine Culture (for pregnant, neutropenic or urologic patients or patients with an indwelling urinary catheter)     Status: Abnormal (Preliminary result)   Collection Time: 07/31/24 11:25 PM   Specimen: Urine, Clean Catch  Result Value Ref Range Status   Specimen Description  Final    URINE, CLEAN CATCH Performed at [facility], [address], [place], [place] [address]    Special Requests   Final    NONE Performed at [facility], [address] [place], [place] [address]    Culture (A)  Final    >=100,000 COLONIES/mL ESCHERICHIA COLI SUSCEPTIBILITIES TO FOLLOW Performed at [facility], [address] [place], [place] [address]    Report Status PENDING  Incomplete     Labs: BNP (last 3 results) No results for input(s): BNP in the last 8760 hours. Basic Metabolic Panel: Recent Labs  Lab 07/31/24 2224 08/01/24 0343  NA 142 141  K 4.2 3.7  CL 106 107  CO2 23 19*  GLUCOSE 98 88  BUN 20 17  CREATININE 1.17* 0.94  CALCIUM 10.1 9.7   Liver Function Tests: Recent Labs  Lab 07/31/24 2224  AST 15  ALT 7  ALKPHOS 77  BILITOT 0.9  PROT 7.7  ALBUMIN  3.9   No results for input(s): LIPASE, AMYLASE in the last 168 hours. No results for input(s): AMMONIA in the last 168 hours. CBC: Recent Labs  Lab 07/31/24 2224 08/01/24 0343  WBC 9.7 10.5  NEUTROABS 6.4  --   HGB 13.6 12.8  HCT 44.8 43.0  MCV 89.6 92.5  PLT 278 249   Cardiac  Enzymes: No results for input(s): CKTOTAL, CKMB, CKMBINDEX, TROPONINI in the last 168 hours. BNP: Invalid input(s): POCBNP CBG: Recent Labs  Lab 07/31/24 2133  GLUCAP 92   D-Dimer No results for input(s): DDIMER in the last 72 hours. Hgb A1c No results for input(s): HGBA1C in the last 72 hours. Lipid Profile No results for input(s): CHOL, HDL, LDLCALC, TRIG, CHOLHDL, LDLDIRECT in the last 72 hours. Thyroid  function studies No results for input(s): TSH, T4TOTAL, T3FREE, THYROIDAB in the last 72 hours.  Invalid input(s): FREET3 Anemia work up No results for input(s): VITAMINB12, FOLATE, FERRITIN, TIBC, IRON, RETICCTPCT in the last 72 hours. Urinalysis    Component Value Date/Time   COLORURINE YELLOW 07/31/2024 2210   APPEARANCEUR CLOUDY (A) 07/31/2024 2210   LABSPEC 1.017 07/31/2024 2210   PHURINE 5.0 07/31/2024 2210   GLUCOSEU NEGATIVE 07/31/2024 2210   GLUCOSEU NEGATIVE 10/14/2023 1517   HGBUR MODERATE (A) 07/31/2024 2210   BILIRUBINUR NEGATIVE 07/31/2024 2210   KETONESUR NEGATIVE 07/31/2024 2210   PROTEINUR 30 (A) 07/31/2024 2210   UROBILINOGEN 0.2 10/14/2023 1517   NITRITE NEGATIVE 07/31/2024 2210   LEUKOCYTESUR MODERATE (A) 07/31/2024 2210   Sepsis Labs Recent Labs  Lab 07/31/24 2224 08/01/24 0343  WBC 9.7 10.5   Microbiology Recent Results (from the past 240 hours)  Culture, blood (routine x 2)     Status: None (Preliminary result)   Collection Time: 07/31/24 11:15 PM   Specimen: BLOOD  Result Value Ref Range Status   Specimen Description   Final    BLOOD LEFT ANTECUBITAL Performed at [facility], [address], [place], [place] [address]    Special Requests   Final    BOTTLES DRAWN AEROBIC AND ANAEROBIC Blood Culture adequate volume Performed at [facility], [address], [place], [place] [address]    Culture   Final    NO GROWTH 1 DAY Performed at [facility], [address], [place], [place] [address]    Report Status PENDING  Incomplete  Culture, blood (routine x 2)     Status: None (Preliminary result)  Collection Time: 07/31/24 11:15 PM   Specimen: BLOOD  Result Value Ref Range Status   Specimen Description   Final    BLOOD RIGHT ANTECUBITAL Performed at [facility], [address] [place], [place] [address]    Special Requests   Final    BOTTLES DRAWN AEROBIC AND ANAEROBIC Blood Culture adequate volume Performed at [facility], [address], [place], [place] [address]    Culture   Final    NO GROWTH 1 DAY Performed at [facility], [address] [place], [place] [address]    Report Status PENDING  Incomplete  Urine Culture (for pregnant, neutropenic or urologic patients or patients with an indwelling urinary catheter)     Status: Abnormal (Preliminary result)   Collection Time: 07/31/24 11:25 PM   Specimen: Urine, Clean Catch  Result Value Ref Range Status   Specimen Description   Final    URINE, CLEAN CATCH Performed at [facility], [address], [place], [place] [address]    Special Requests   Final    NONE Performed at [facility], [address], [place], [place] [address]    Culture (A)  Final    >=100,000 COLONIES/mL ESCHERICHIA COLI SUSCEPTIBILITIES TO FOLLOW Performed at [facility], [address], [place], [place] [address]    Report Status PENDING  Incomplete     Time coordinating discharge: 35 minutes  SIGNED:   Renato Applebaum, MD  [facility] 08/02/2024, 1:00 PM

## 2024-08-02 NOTE — Progress Notes (Signed)
 Patient and patient's sister was given discharge instructions, and all questions were answered. Patient was stable for discharge and was taken to the main exit by wheelchair.

## 2024-08-03 ENCOUNTER — Telehealth: Payer: Self-pay

## 2024-08-03 LAB — URINE CULTURE: Culture: >=100000 — AB

## 2024-08-03 NOTE — Transitions of Care (Post Inpatient/ED Visit) (Signed)
   08/03/2024  Name: Deborah Myers MRN: [id number] DOB: [birth year]  Today's TOC FU Call Status: Today's TOC FU Call Status:: Successful TOC FU Call Completed TOC FU Call Complete Date: 08/03/24 Patient's Name and Date of Birth confirmed.  Transition Care Management Follow-up Telephone Call Date of Discharge: 08/02/24 Discharge Facility: [facility]) Type of Discharge: Inpatient Admission Primary Inpatient Discharge Diagnosis:: chest pain How have you been since you were released from the hospital?: Better Any questions or concerns?: No  Items Reviewed: Did you receive and understand the discharge instructions provided?: Yes Medications obtained,verified, and reconciled?: Yes (Medications Reviewed) Any new allergies since your discharge?: No Dietary orders reviewed?: Yes Do you have support at home?: Yes People in Home [RPT]: sibling(s)  Medications Reviewed Today: Medications Reviewed Today     Reviewed by Emmitt Pan, LPN (Licensed Practical Nurse) on 08/03/24 at 1502  Med List Status: <None>   Medication Order Taking? Sig Documenting Provider Last Dose Status Informant  amLODipine -valsartan  (EXFORGE ) 5-160 MG tablet [id number] Yes Take 1 tablet by mouth daily. Plotnikov, Aleksei V, MD  Active Family Member  atenolol  (TENORMIN ) 25 MG tablet [id number] Yes TAKE 1 TABLET EVERY DAY Plotnikov, Aleksei V, MD  Active Family Member  b complex vitamins tablet [id number] Yes Take 1 tablet by mouth daily. Plotnikov, Aleksei V, MD  Active Family Member  cephALEXin  (KEFLEX ) 500 MG capsule [id number] Yes Take 1 capsule (500 mg total) by mouth 3 (three) times daily for 4 days. Raenelle Coria, MD  Active   Cholecalciferol (VITAMIN D3) 50 MCG (2000 UT) capsule [id number] Yes Take 1 capsule (2,000 Units total) by mouth daily. Plotnikov, Aleksei V, MD  Active Family Member  donepezil  (ARICEPT ) 10 MG tablet [id number] Yes Take 1 tablet (10 mg total) by mouth at bedtime. Plotnikov, Karlynn GAILS,  MD  Active Family Member  memantine  (NAMENDA ) 10 MG tablet [id number] Yes TAKE 1 TABLET TWICE DAILY Plotnikov, Aleksei V, MD  Active Family Member            Home Care and Equipment/Supplies: Were Home Health Services Ordered?: Yes Name of Home Health Agency:: unknown Has Agency set up a time to come to your home?: No Any new equipment or medical supplies ordered?: NA  Functional Questionnaire: Do you need assistance with bathing/showering or dressing?: Yes Do you need assistance with meal preparation?: Yes Do you need assistance with eating?: No Do you have difficulty maintaining continence: No Do you need assistance with getting out of bed/getting out of a chair/moving?: No Do you have difficulty managing or taking your medications?: Yes  Follow up appointments reviewed: PCP Follow-up appointment confirmed?: Yes Date of PCP follow-up appointment?: 08/06/24 Follow-up Provider: [facility] Follow-up appointment confirmed?: NA Do you need transportation to your follow-up appointment?: No Do you understand care options if your condition(s) worsen?: Yes-patient verbalized understanding    SIGNATURE Pan Emmitt, LPN [facility] Nurse Health Advisor Direct Dial [phone]

## 2024-08-04 ENCOUNTER — Ambulatory Visit: Payer: Self-pay | Admitting: Internal Medicine

## 2024-08-05 ENCOUNTER — Telehealth: Payer: Self-pay | Admitting: Internal Medicine

## 2024-08-05 NOTE — Telephone Encounter (Signed)
 Copied from CRM [id number]. Topic: Clinical - Home Health Verbal Orders >> Aug 05, 2024  3:11 PM Shereese L wrote: Caller/Agency: [facility] Callback [phone] Service Requested: Physical Therapy Frequency: Once a week for 1 week, 2x a week for 7 weeks Any new concerns about the patient? No

## 2024-08-06 ENCOUNTER — Ambulatory Visit: Admitting: Internal Medicine

## 2024-08-06 ENCOUNTER — Encounter: Payer: Self-pay | Admitting: Internal Medicine

## 2024-08-06 VITALS — BP 148/78 | HR 50 | Temp 98.0°F | Ht 66.5 in | Wt 178.0 lb

## 2024-08-06 DIAGNOSIS — G309 Alzheimer's disease, unspecified: Secondary | ICD-10-CM | POA: Diagnosis not present

## 2024-08-06 DIAGNOSIS — M810 Age-related osteoporosis without current pathological fracture: Secondary | ICD-10-CM

## 2024-08-06 DIAGNOSIS — F0280 Dementia in other diseases classified elsewhere without behavioral disturbance: Secondary | ICD-10-CM

## 2024-08-06 DIAGNOSIS — N3000 Acute cystitis without hematuria: Secondary | ICD-10-CM

## 2024-08-06 DIAGNOSIS — N183 Chronic kidney disease, stage 3 unspecified: Secondary | ICD-10-CM

## 2024-08-06 LAB — CULTURE, BLOOD (ROUTINE X 2)
Culture: NO GROWTH
Culture: NO GROWTH
Special Requests: ADEQUATE
Special Requests: ADEQUATE

## 2024-08-06 NOTE — Assessment & Plan Note (Signed)
 Monitoring GFR

## 2024-08-06 NOTE — Assessment & Plan Note (Addendum)
 Cont w/Namenda  and Aricept . Progressing disease Assisted living placement would be appropriate See letter

## 2024-08-06 NOTE — Assessment & Plan Note (Signed)
 On Vit D

## 2024-08-06 NOTE — Telephone Encounter (Signed)
 LVM for [place], does not appear to be a secured VM no verbals given

## 2024-08-06 NOTE — Progress Notes (Signed)
 Subjective:  Patient ID: Deborah Myers, female    DOB: Sep 10, 1936  Age: 88 y.o. MRN: [id number]  CC: Hospitalization Follow-up (Wants to discuss assisted living home. )   HPI Deborah Myers presents for a hospital stay f/u She is here w/Joyce.Makaylin is staying w/Joyce after d/c  F/u on dementia, UTI, HTN  Per hx:  Admit date: 07/31/2024 Discharge date: 08/02/2024   Admitted From: Home Disposition: Home with home health   Recommendations for Outpatient Follow-up:  Follow up with PCP in 1-2 weeks Please obtain BMP/CBC in one week Please follow up on the following pending results: Final urine cultures   Home Health: PT/OT Equipment/Devices: Available at home   Discharge Condition: Stable CODE STATUS: Full code Diet recommendation: Low-salt diet   Discharge summary: 88 year old with history of Alzheimer's disease, CKD stage IIIa, hypertension hyperlipidemia presented with generalized weakness, poor oral intake and decreased level of activity. Lives alone. Sister lives nearby. In the emergency room hemodynamically stable. She had no complaints herself. Confused .  Urine was abnormal.  Admitted and treated with antibiotics.  Urine culture with E. coli.   Acute UTI present on admission secondary to E. coli.  Infective encephalopathy in a patient with baseline dementia.   She was in the hospital.  Treated with Rocephin  x 3 today.  Clinically improved.  Urine culture with E. coli, final sensitivity pending.  Previous E. coli sensitive to cephalosporins.  No focal neurological deficits.  With good clinical improvement, going home today with Keflex  500 mg 3 times daily for additional 4 days to complete 7 days of therapy.   Chronic medical issues including Hypertension, amlodipine  valsartan  and atenolol  to continue. CKD stage IIIa, at about baseline. Dementia, lives at home.  Sister primary caretaker.  She will benefit with home health PT OT.   Stable to transition home today.   On oral antibiotics.   Discharge Diagnoses:  Principal Problem:   Urinary tract infection Active Problems:   Essential hypertension   Alzheimer disease (HCC)   Chronic kidney disease, stage 3a [facility])       Discharge Instructions   Discharge Instructions       Diet - low sodium heart healthy   Complete by: As directed      Increase activity slowly   Complete by: As directed           Allergies as of 08/02/2024         Reactions    Tiazac  [diltiazem ]      edema            Medication List       TAKE these medications     amLODipine -valsartan  5-160 MG tablet Commonly known as: EXFORGE  Take 1 tablet by mouth daily.    atenolol  25 MG tablet Commonly known as: TENORMIN  TAKE 1 TABLET EVERY DAY    b complex vitamins tablet Take 1 tablet by mouth daily.    cephALEXin  500 MG capsule Commonly known as: KEFLEX  Take 1 capsule (500 mg total) by mouth 3 (three) times daily for 4 days.    donepezil  10 MG tablet Commonly known as: ARICEPT  Take 1 tablet (10 mg total) by mouth at bedtime.    memantine  10 MG tablet Commonly known as: NAMENDA  TAKE 1 TABLET TWICE DAILY    Vitamin D3 50 MCG (2000 UT) capsule Take 1 capsule (2,000 Units total) by mouth daily.            Outpatient Medications Prior to Visit  Medication Sig Dispense Refill   amLODipine -valsartan  (EXFORGE ) 5-160 MG tablet Take 1 tablet by mouth daily. 90 tablet 3   atenolol  (TENORMIN ) 25 MG tablet TAKE 1 TABLET EVERY DAY 90 tablet 3   b complex vitamins tablet Take 1 tablet by mouth daily. 100 tablet 3   Cholecalciferol (VITAMIN D3) 50 MCG (2000 UT) capsule Take 1 capsule (2,000 Units total) by mouth daily. 100 capsule 3   donepezil  (ARICEPT ) 10 MG tablet Take 1 tablet (10 mg total) by mouth at bedtime. 90 tablet 3   memantine  (NAMENDA ) 10 MG tablet TAKE 1 TABLET TWICE DAILY 180 tablet 3   cephALEXin  (KEFLEX ) 500 MG capsule Take 1 capsule (500 mg total) by mouth 3 (three) times daily for 4 days.  (Patient not taking: Reported on 08/06/2024) 12 capsule 0   No facility-administered medications prior to visit.    ROS: Review of Systems  Constitutional:  Negative for activity change, appetite change, chills, fatigue and unexpected weight change.  HENT:  Negative for congestion, mouth sores and sinus pressure.   Eyes:  Negative for visual disturbance.  Respiratory:  Negative for cough and chest tightness.   Gastrointestinal:  Negative for abdominal pain and nausea.  Genitourinary:  Negative for difficulty urinating, frequency and vaginal pain.  Musculoskeletal:  Positive for gait problem. Negative for back pain.  Skin:  Negative for pallor and rash.  Neurological:  Negative for dizziness, tremors, weakness, numbness and headaches.  Psychiatric/Behavioral:  Positive for confusion and decreased concentration. Negative for sleep disturbance. The patient is not nervous/anxious.     Objective:  BP (!) 148/78   Pulse (!) 50   Temp 98 F (36.7 C) (Oral)   Ht 5' 6.5 (1.689 m)   Wt 178 lb (80.7 kg)   SpO2 98%   BMI 28.30 kg/m   BP Readings from Last 3 Encounters:  08/06/24 (!) 148/78  08/02/24 (!) 159/68  10/14/23 110/70    Wt Readings from Last 3 Encounters:  08/06/24 178 lb (80.7 kg)  08/01/24 179 lb 3.7 oz (81.3 kg)  12/16/23 179 lb (81.2 kg)    Physical Exam Constitutional:      General: She is not in acute distress.    Appearance: She is well-developed.  HENT:     Head: Normocephalic.     Right Ear: External ear normal.     Left Ear: External ear normal.     Nose: Nose normal.  Eyes:     General:        Right eye: No discharge.        Left eye: No discharge.     Conjunctiva/sclera: Conjunctivae normal.     Pupils: Pupils are equal, round, and reactive to light.  Neck:     Thyroid : No thyromegaly.     Vascular: No JVD.     Trachea: No tracheal deviation.  Cardiovascular:     Rate and Rhythm: Normal rate and regular rhythm.     Heart sounds: Normal heart  sounds.  Pulmonary:     Effort: No respiratory distress.     Breath sounds: No stridor. No wheezing.  Abdominal:     General: Bowel sounds are normal. There is no distension.     Palpations: Abdomen is soft. There is no mass.     Tenderness: There is no abdominal tenderness. There is no guarding or rebound.  Musculoskeletal:        General: No tenderness.     Cervical back: Normal range of motion and  neck supple. No rigidity.  Lymphadenopathy:     Cervical: No cervical adenopathy.  Skin:    Findings: No erythema or rash.  Neurological:     Mental Status: Mental status is at baseline. She is disoriented.     Cranial Nerves: No cranial nerve deficit.     Motor: Weakness present. No abnormal muscle tone.     Coordination: Coordination abnormal.     Gait: Gait abnormal.     Deep Tendon Reflexes: Reflexes normal.  Psychiatric:        Mood and Affect: Mood normal.        Behavior: Behavior normal.        Thought Content: Thought content normal.   Using a walker  Lab Results  Component Value Date   WBC 10.5 08/01/2024   HGB 12.8 08/01/2024   HCT 43.0 08/01/2024   PLT 249 08/01/2024   GLUCOSE 88 08/01/2024   CHOL 283 (H) 04/01/2018   TRIG 78.0 04/01/2018   HDL 61.60 04/01/2018   LDLCALC 206 (H) 04/01/2018   ALT 7 07/31/2024   AST 15 07/31/2024   NA 141 08/01/2024   K 3.7 08/01/2024   CL 107 08/01/2024   CREATININE 0.94 08/01/2024   BUN 17 08/01/2024   CO2 19 (L) 08/01/2024   TSH 4.63 10/14/2023   HGBA1C 5.8 07/11/2023    DG Chest Portable 1 View Result Date: 07/31/2024 CLINICAL DATA:  Altered mental status and weakness. EXAM: PORTABLE CHEST 1 VIEW COMPARISON:  Chest x-ray 10/05/2012 FINDINGS: The heart is enlarged. There are atherosclerotic calcifications of the aorta. The lungs are clear. There is no pleural effusion or pneumothorax. No acute fractures are seen. IMPRESSION: Cardiomegaly. No acute pulmonary process. Electronically Signed   By: Greig Pique M.D.   On:  07/31/2024 21:50    Assessment & Plan:   Problem List Items Addressed This Visit     Alzheimer disease (HCC) - Primary   Cont w/Namenda  and Aricept . Progressing disease Assisted living placement would be appropriate See letter       Relevant Orders   AMB Referral VBCI Care Management   CRF (chronic renal failure), stage 3 (moderate) (HCC)   Monitoring GFR      Urinary tract infection   Check UA and Cx prn.  Recurrent. Finishing Keflex  E coli - sensitive to all abx         No orders of the defined types were placed in this encounter.     Follow-up: Return in about 3 months (around 11/05/2024) for a follow-up visit.  Marolyn Noel, MD

## 2024-08-06 NOTE — Assessment & Plan Note (Signed)
 Check UA and Cx prn.  Recurrent. Finishing Keflex  E coli - sensitive to all abx

## 2024-08-07 ENCOUNTER — Telehealth: Payer: Self-pay

## 2024-08-07 NOTE — Telephone Encounter (Signed)
 Copied from CRM [id number]. Topic: Clinical - Home Health Verbal Orders >> Aug 07, 2024  3:39 PM Rea C wrote: Caller/Agency: Will /[facility]  Callback Number: [phone] and it is secure voicemail. Service Requested: Request to move initial evaluation to next week because he has not been able to get patient to schedule.  Frequency:  Any new concerns about the patient?

## 2024-08-07 NOTE — Telephone Encounter (Signed)
 COMPLETED.  Copied from CRM [id number]. Topic: General - Call Back - No Documentation >> Aug 07, 2024  2:18 PM Rea ORN wrote: Reason for CRM: [place] with [facility] returning [facility] call.  Callback Number:[phone]

## 2024-08-07 NOTE — Telephone Encounter (Signed)
 Spoke with Medford, verbals given.

## 2024-08-07 NOTE — Telephone Encounter (Signed)
Verbals given  

## 2024-08-12 ENCOUNTER — Telehealth: Payer: Self-pay | Admitting: *Deleted

## 2024-08-12 NOTE — Progress Notes (Signed)
 Complex Care Management Note  Care Guide Note 08/12/2024 Name: Deborah Myers MRN: [id number] DOB: [birth year]  Deborah Myers is a 88 y.o. year old female who sees Plotnikov, Aleksei V, MD for primary care. I reached out to Ronal Toy Mau by phone today to offer complex care management services.  Deborah Myers was given information about Complex Care Management services today including:   The Complex Care Management services include support from the care team which includes your Nurse Care Manager, Clinical Social Worker, or Pharmacist.  The Complex Care Management team is here to help remove barriers to the health concerns and goals most important to you. Complex Care Management services are voluntary, and the patient may decline or stop services at any time by request to their care team member.   Complex Care Management Consent Status: Patient agreed to services and verbal consent obtained.   Follow up plan:  Telephone appointment with complex care management team member scheduled for:  08/21/2024  Encounter Outcome:  Patient Scheduled  Thedford Franks, CMA, [place]  [facility], [facility] Guide Direct Dial: [phone]  Fax: [phone] Website: [email or web address]

## 2024-08-14 ENCOUNTER — Telehealth: Payer: Self-pay

## 2024-08-14 NOTE — Telephone Encounter (Signed)
 Copied from CRM [id number]. Topic: Clinical - Home Health Verbal Orders >> Aug 13, 2024  4:40 PM DeAngela L wrote: Caller/Agency: Will Occupational Therapist with [facility]  Callback Number: [phone] secured line  Service Requested: Occupational Therapy Frequency: 2w4 Any new concerns about the patient? No

## 2024-08-14 NOTE — Telephone Encounter (Signed)
 Verbal orders given

## 2024-08-21 ENCOUNTER — Other Ambulatory Visit: Payer: Self-pay | Admitting: Licensed Clinical Social Worker

## 2024-08-21 NOTE — Patient Outreach (Signed)
 Complex Care Management   Visit Note  08/21/2024  Name:  Deborah Myers MRN: [id number] DOB: Sep 10, 1936  Situation: Referral received for Complex Care Management related to Dementia and and Level of Care Concerns. I obtained verbal consent from Caregiver.  Visit completed with Caregiver  on the phone  Background:   Past Medical History:  Diagnosis Date   Arthritis    Cataract    Cecum mass 2008   Benign    HTN (hypertension)    Hyperlipidemia    Osteoporosis    no per pt    Assessment: Patient Reported Symptoms:  Cognitive Cognitive Status: Unable to Assess   Health Maintenance Behaviors: Annual physical exam Healing Pattern: Average Health Facilitated by: Rest  Neurological Neurological Review of Symptoms: Other: [place] Neurological Symptoms/Conditions [RPT]: Recent hospitalization for UTI Neurological Management Strategies: Medication therapy, Adequate rest, Routine screening (Takes medication prescribed) Neurological Self-Management Outcome: 4 (good) Neurological Comment: UTI has improved- per sister  Psychosocial Psychosocial Symptoms Reported: No symptoms reported Behavioral Management Strategies: Adequate rest, Coping strategies, Support system Behavioral Health Self-Management Outcome: 4 (good) Major Change/Loss/Stressor/Fears (CP): Medical condition, self Techniques to Cope with Loss/Stress/Change: Medication Quality of Family Relationships: helpful, involved, supportive Do you feel physically threatened by others?: No   There were no vitals filed for this visit.  Medications Reviewed Today     Reviewed by Merlynn Lyle CROME, LCSW (Social Worker) on 08/21/24 at 1318  Med List Status: <None>   Medication Order Taking? Sig Documenting Provider Last Dose Status Informant  amLODipine -valsartan  (EXFORGE ) 5-160 MG tablet [id number]  Take 1 tablet by mouth daily. Plotnikov, Aleksei V, MD  Active Family Member  atenolol  (TENORMIN ) 25 MG tablet [id number]  TAKE 1 TABLET  EVERY DAY Plotnikov, Aleksei V, MD  Active Family Member  b complex vitamins tablet [id number]  Take 1 tablet by mouth daily. Plotnikov, Karlynn GAILS, MD  Active Family Member  Cholecalciferol (VITAMIN D3) 50 MCG (2000 UT) capsule [id number]  Take 1 capsule (2,000 Units total) by mouth daily. Plotnikov, Karlynn GAILS, MD  Active Family Member  donepezil  (ARICEPT ) 10 MG tablet [id number]  Take 1 tablet (10 mg total) by mouth at bedtime. Plotnikov, Karlynn GAILS, MD  Active Family Member  memantine  (NAMENDA ) 10 MG tablet [id number]  TAKE 1 TABLET TWICE DAILY Plotnikov, Aleksei V, MD  Active Family Member            Recommendation:   PCP Follow-up Continue Current Plan of Care  Follow Up Plan:   Telephone follow-up on 09/11/24 at 11 am  Lyle Merlynn, BSW, MSW, LCSW Licensed Clinical Social Worker [employer]   [email or web address] Direct Dial: [phone]

## 2024-08-21 NOTE — Patient Instructions (Signed)
 Visit Information  Thank you for taking time to visit with me today. Please don't hesitate to contact me if I can be of assistance to you before our next scheduled appointment.  Our next appointment is by telephone on 09/11/24 at 11 am Please call the care guide team at [phone] if you need to cancel or reschedule your appointment.   Following is a copy of your care plan:   Goals Addressed             This Visit's Progress    LCSW VBCI Social Work Care Plan       Problems:   Level of Care and Care Coordination needs related to Dementia  CSW Clinical Goal(s):   Over the next 90 days the patient/family will decrease symptoms of caregiver strain by enhancing safety in the home, promoting social interaction, managing chronic diseases and utilizing community resources in order to pursue ALF placement.   Interventions: Current level of care: Home with other family or significant other(s): Pt and sister live together. Sister is primary caregiver of patient. Evaluation of patient safety in current living environment and review of Dementia resources and support  Assessed needs, level of care concerns, how currently meeting needs and barriers to care Community Alternative Program (CAP) Pt unable to qualify for Medicaid in the community but sister is interested in applying for Special Assistance Medicaid (ALF Medicaid) Discuss community support options (Adult Day Programs) Discussed private pay options for personal care needs (Private Pay Caregivers) DSS in-home aide program:(Per Sister, family is already on this Wait list) Long-Term Care Insurance (Family do not want to consider any out of pocket expenses at this time in order to increase home support)  Social Determinants of Health in Patient with  SDOH assessments completed- Financial Strain regarding ALF placement Evaluation of current treatment plan related to unmet needs   Patient Goals/Self-Care Activities: Increase self-care,  Take All Medications As Prescribed, Attend all Medical Appointments    Plan: VBCI LCSW will follow up within one month          Please call the Suicide and Crisis Lifeline: 988 call the [place]  National Suicide Prevention Lifeline: 548-270-9093 or TTY: 202 674 3544 TTY 610-206-2509) to talk to a trained counselor go to [facility] [address], [place] [phone]) call 911 if you are experiencing a Mental Health or Behavioral Health Crisis or need someone to talk to.  Patient verbalizes understanding of instructions and care plan provided today and agrees to view in MyChart. Active MyChart status and patient understanding of how to access instructions and care plan via MyChart confirmed with patient.     Lyle Rung, BSW, MSW, LCSW Licensed Clinical Social Worker [employer]   [facility] [email or web address] Direct Dial: [phone]

## 2024-09-11 ENCOUNTER — Other Ambulatory Visit: Payer: Self-pay | Admitting: *Deleted

## 2024-09-11 ENCOUNTER — Telehealth: Payer: Self-pay

## 2024-09-11 NOTE — Patient Instructions (Signed)
 Visit Information  Thank you for taking time to visit with me today. Please don't hesitate to contact me if I can be of assistance to you before our next scheduled appointment.  Your next care management appointment is no further scheduled appointments.   Closing From: Complex Care Management.  Please call the care guide team at [phone] if you need to cancel, schedule, or reschedule an appointment.   Please call the Suicide and Crisis Lifeline: 988 call the [place]  National Suicide Prevention Lifeline: 215-840-9677 or TTY: 6163733727 TTY 512-668-3376) to talk to a trained counselor call 1-800-273-TALK (toll free, 24 hour hotline) go to [facility] [address] [place] [phone]) if you are experiencing a Mental Health or Behavioral Health Crisis or need someone to talk to.  Silverio Hagan, LCSW   [facility], [facility] Licensed Clinical Social Worker  Direct Dial: [phone]

## 2024-09-11 NOTE — Patient Outreach (Signed)
 Complex Care Management   Visit Note  09/11/2024  Name:  Deborah Myers MRN: [id number] DOB: 07-Jun-1936  Situation: Referral received for Complex Care Management related to Dementia and and Level of Care Concerns. I obtained verbal consent from Caregiver.  Visit completed with Caregiver  on the phone   Background:   Past Medical History:  Diagnosis Date   Arthritis    Cataract    Cecum mass 2008   Benign    HTN (hypertension)    Hyperlipidemia    Osteoporosis    no per pt    Assessment: Patient Reported Symptoms:  Cognitive Cognitive Status: Able to follow simple commands, Poor judgment in daily scenarios, Struggling with memory recall, Alert and oriented to person, place, and time Cognitive/Intellectual Conditions Management [RPT]: Other Other: patient has dementia   Health Maintenance Behaviors: Annual physical exam Healing Pattern: Average Health Facilitated by: Rest  Neurological Neurological Review of Symptoms: No symptoms reported    HEENT HEENT Symptoms Reported: No symptoms reported      Cardiovascular Cardiovascular Symptoms Reported: No symptoms reported    Respiratory Respiratory Symptoms Reported: No symptoms reported    Endocrine Endocrine Symptoms Reported: No symptoms reported Is patient diabetic?: No    Gastrointestinal Gastrointestinal Symptoms Reported: Incontinence Additional Gastrointestinal Details: wears depends, per patient's sister may wear depends too long -sister tries to get her to change her depends more often-strong odor of urine Gastrointestinal Management Strategies: Incontinence garment/pad    Genitourinary Genitourinary Symptoms Reported: Incontinence, Malodorous urine    Integumentary Integumentary Symptoms Reported: No symptoms reported    Musculoskeletal Musculoskelatal Symptoms Reviewed: Limited mobility Additional Musculoskeletal Details: uses a walker-walks very slowly-PT currently with [facility]  Strategies: Medical device Falls in the past year?: No Number of falls in past year: 1 or less Was there an injury with Fall?: No Fall Risk Category Calculator: 0 Patient Fall Risk Level: Low Fall Risk    Psychosocial Psychosocial Symptoms Reported: No symptoms reported Behavioral Management Strategies: Adequate rest, Support system Behavioral Health Self-Management Outcome: 4 (good) Major Change/Loss/Stressor/Fears (CP): Medical condition, self Quality of Family Relationships: helpful, involved, supportive Do you feel physically threatened by others?: No    09/11/2024    PHQ2-9 Depression Screening   Little interest or pleasure in doing things Not at all  Feeling down, depressed, or hopeless Not at all  PHQ-2 - Total Score 0  Trouble falling or staying asleep, or sleeping too much    Feeling tired or having little energy    Poor appetite or overeating     Feeling bad about yourself - or that you are a failure or have let yourself or your family down    Trouble concentrating on things, such as reading the newspaper or watching television    Moving or speaking so slowly that other people could have noticed.  Or the opposite - being so fidgety or restless that you have been moving around a lot more than usual    Thoughts that you would be better off dead, or hurting yourself in some way    PHQ2-9 Total Score    If you checked off any problems, how difficult have these problems made it for you to do your work, take care of things at home, or get along with other people    Depression Interventions/Treatment      There were no vitals filed for this visit.  Medications Reviewed Today     Reviewed by Ermalinda Lenn HERO, LCSW (Social Worker)  on 09/11/24 at 1159  Med List Status: <None>   Medication Order Taking? Sig Documenting Provider Last Dose Status Informant  amLODipine -valsartan  (EXFORGE ) 5-160 MG tablet [id number] Yes Take 1 tablet by mouth daily. Plotnikov, Aleksei V, MD  Active  Family Member  atenolol  (TENORMIN ) 25 MG tablet [id number] Yes TAKE 1 TABLET EVERY DAY Plotnikov, Aleksei V, MD  Active Family Member  b complex vitamins tablet [id number] Yes Take 1 tablet by mouth daily. Plotnikov, Karlynn GAILS, MD  Active Family Member  Cholecalciferol (VITAMIN D3) 50 MCG (2000 UT) capsule [id number] Yes Take 1 capsule (2,000 Units total) by mouth daily. Plotnikov, Aleksei V, MD  Active Family Member  donepezil  (ARICEPT ) 10 MG tablet [id number] Yes Take 1 tablet (10 mg total) by mouth at bedtime. Plotnikov, Karlynn GAILS, MD  Active Family Member  memantine  (NAMENDA ) 10 MG tablet [id number] Yes TAKE 1 TABLET TWICE DAILY Plotnikov, Aleksei V, MD  Active Family Member            Recommendation:   PCP Follow-up Continue to review placement options in anticipation of need for out of home placement in the future  Follow Up Plan:   Closing From:  Complex Care Management  Katye Valek, LCSW [place]  [facility], [facility] Licensed Clinical Social Worker  Direct Dial: [phone]

## 2024-09-11 NOTE — Telephone Encounter (Signed)
 Copied from CRM [id number]. Topic: Clinical - Home Health Verbal Orders >> Sep 11, 2024  9:00 AM Deaijah H wrote: Caller/Agency: Will OT w/ [facility]  Callback Number: [phone] Service Requested: Occupational Therapy (OT Discharge Eval ) Frequency: 1 wk (next week) Any new concerns about the patient? No

## 2024-09-14 NOTE — Telephone Encounter (Signed)
 Okay.  Thanks.

## 2024-09-21 ENCOUNTER — Other Ambulatory Visit: Payer: Self-pay | Admitting: Internal Medicine

## 2024-09-25 ENCOUNTER — Telehealth: Payer: Self-pay

## 2024-09-25 NOTE — Telephone Encounter (Signed)
 Copied from CRM [id number]. Topic: Clinical - Home Health Verbal Orders >> Sep 25, 2024 10:42 AM Berneda FALCON wrote: Caller/Agency: [place], PT with [facility] Callback Number: [phone] Service Requested: Physical Therapy Frequency: Medford states pt is supposed to discharge today, but patient called and states she is unavailable so he wants to move discharge to next week. Any new concerns about the patient? No

## 2024-09-28 NOTE — Telephone Encounter (Signed)
 Okay.  Thanks.

## 2024-09-30 ENCOUNTER — Telehealth: Payer: Self-pay

## 2024-09-30 NOTE — Telephone Encounter (Signed)
 Copied from CRM [id number]. Topic: Clinical - Home Health Verbal Orders >> Sep 25, 2024 10:42 AM Berneda FALCON wrote: Caller/Agency: [place], PT with [facility] Callback Number: [phone] Service Requested: Physical Therapy Frequency: Medford states pt is supposed to discharge today, but patient called and states she is unavailable so he wants to move discharge to next week. Any new concerns about the patient? No >> Sep 30, 2024  9:46 AM Drema MATSU wrote: Merlynn (pt sister) is requesting a callback from [place].

## 2024-09-30 NOTE — Telephone Encounter (Addendum)
 I was able to speak with Medford, PT with [facility] and give him the verbal OK per PCP for  Service Requested: Physical Therapy Frequency: Medford states pt is supposed to discharge today, but patient called and states she is unavailable so he wants to move discharge to next week

## 2024-09-30 NOTE — Telephone Encounter (Signed)
 Please inform pts upon her call back to the clinic that the call got disconnected... However, I was able to speak with Medford in regards to pts PT.

## 2024-10-21 ENCOUNTER — Ambulatory Visit: Admitting: Podiatry

## 2024-10-21 ENCOUNTER — Encounter: Payer: Self-pay | Admitting: Podiatry

## 2024-10-21 DIAGNOSIS — M79675 Pain in left toe(s): Secondary | ICD-10-CM

## 2024-10-21 DIAGNOSIS — B351 Tinea unguium: Secondary | ICD-10-CM | POA: Diagnosis not present

## 2024-10-21 DIAGNOSIS — M79674 Pain in right toe(s): Secondary | ICD-10-CM | POA: Diagnosis not present

## 2024-10-21 NOTE — Progress Notes (Signed)
  Subjective:  Patient ID: Deborah Myers, female    DOB: [birth year],  MRN: [id number]  Angeles Zehner presents to clinic today for painful mycotic toenails of both feet that are difficult to trim. Pain interferes with daily activities and wearing enclosed shoe gear comfortably. Her sister is present during today's visit. Chief Complaint  Patient presents with   [facility]    Rm16   New problem(s): None.   PCP is Plotnikov, Karlynn GAILS, MD. ARNETTA 08/06/24.  Allergies  Allergen Reactions   Tiazac  [Diltiazem ]     edema    Review of Systems: Negative except as noted in the HPI.  Objective: No changes noted in today's physical examination. There were no vitals filed for this visit. Deborah Myers is a pleasant 88 y.o. female in NAD. AAO x 3.  Vascular Examination: CFT <3 seconds b/l. DP/PT pulses faintly palpable b/l. Trace edema b/l. Skin temperature gradient warm to warm b/l. Digital hair absent. No pain with calf compression. No ischemia or gangrene. No cyanosis or clubbing noted b/l.    Neurological Examination: Sensation grossly intact b/l with 10 gram monofilament. Vibratory sensation intact b/l.   Dermatological Examination: Pedal skin warm and supple b/l.   No open wounds. No interdigital macerations.  Toenails 1-5 b/l thick, discolored, elongated with subungual debris and pain on dorsal palpation.    No corns, calluses, nor porokeratotic lesions.  Musculoskeletal Examination: Muscle strength 5/5 to all lower extremity muscle groups bilaterally. HAV with bunion deformity noted b/l LE. Hammertoe(s) bilateral 2nd toes.  Radiographs: None  Assessment/Plan: 1. Pain due to onychomycosis of toenails of both feet   -Patient with h/o dementia/Alzheimer's/cognitive deficit. Patient's family member present. All questions/concerns addressed on today's visit. -Patient to continue soft, supportive shoe gear daily. -Mycotic toenails 1-5 bilaterally were debrided in length and  girth with sterile nail nippers and dremel without incident. -Patient/POA to call should there be question/concern in the interim.   Return in about 3 months (around 01/19/2025).  Delon LITTIE Merlin, DPM      [place] LOCATION: [address], [place] [address]                   Office [phone]   [facility] LOCATION: [address] [place], [place] [address] Office [phone]

## 2024-10-28 ENCOUNTER — Telehealth: Payer: Self-pay | Admitting: Internal Medicine

## 2024-10-28 NOTE — Telephone Encounter (Signed)
 Patient's sister dropped off document FL2, to be filled out by provider. Patient requested to send it back via Call Patient's sister to pick up within 7-days. Document is located in providers tray at front office.Please advise at [facility] [phone]

## 2024-11-03 NOTE — Telephone Encounter (Unsigned)
 Copied from CRM [id number]. Topic: General - Other >> Nov 03, 2024  9:53 AM China J wrote: Reason for CRM: Patient's sister is calling to see if the [facility] form is ready for pick up. It has been about a week now, and she would like a call for some sort of update.   Please call: [phone]

## 2024-11-03 NOTE — Telephone Encounter (Signed)
 PCP has been out of the office and will return Wednesday. Paper is on PCP desk awaiting his signature.

## 2024-11-24 ENCOUNTER — Telehealth: Payer: Self-pay

## 2024-11-24 NOTE — Telephone Encounter (Signed)
 Copied from CRM [id number]. Topic: General - Other >> Nov 24, 2024 11:24 AM Alfonso HERO wrote: Reason for CRM: patient calling for status of her paperwork being signed. Says she has yet to hear back from anyone. She is asking for a call back.

## 2024-12-04 ENCOUNTER — Other Ambulatory Visit: Payer: Self-pay | Admitting: Internal Medicine

## 2024-12-07 NOTE — Telephone Encounter (Signed)
 Or call [phone]

## 2024-12-07 NOTE — Telephone Encounter (Signed)
 Please call patient's sister concerning this paperwork - [phone]

## 2024-12-09 NOTE — Telephone Encounter (Signed)
Sister picked up

## 2025-02-03 ENCOUNTER — Ambulatory Visit: Admitting: Podiatry

## 2025-05-12 ENCOUNTER — Ambulatory Visit
